# Patient Record
Sex: Female | Born: 1950 | Race: White | Hispanic: No | Marital: Married | Smoking: Former smoker
Health system: Southern US, Community
[De-identification: ages and names within clinical notes are randomized; demographics above are authoritative.]

## PROBLEM LIST (undated history)

## (undated) DIAGNOSIS — M1990 Unspecified osteoarthritis, unspecified site: Secondary | ICD-10-CM

## (undated) DIAGNOSIS — T7840XA Allergy, unspecified, initial encounter: Secondary | ICD-10-CM

## (undated) DIAGNOSIS — D649 Anemia, unspecified: Secondary | ICD-10-CM

## (undated) HISTORY — DX: Unspecified osteoarthritis, unspecified site: M19.90

## (undated) HISTORY — PX: REDUCTION MAMMAPLASTY: SUR839

## (undated) HISTORY — PX: TONSILLECTOMY: SUR1361

## (undated) HISTORY — DX: Allergy, unspecified, initial encounter: T78.40XA

---

## 2000-02-26 HISTORY — PX: BREAST SURGERY: SHX581

## 2001-04-18 ENCOUNTER — Emergency Department (HOSPITAL_COMMUNITY): Admission: EM | Admit: 2001-04-18 | Discharge: 2001-04-18 | Payer: Self-pay

## 2002-02-19 ENCOUNTER — Ambulatory Visit (HOSPITAL_COMMUNITY): Admission: RE | Admit: 2002-02-19 | Discharge: 2002-02-19 | Payer: Self-pay | Admitting: *Deleted

## 2003-12-23 ENCOUNTER — Other Ambulatory Visit: Admission: RE | Admit: 2003-12-23 | Discharge: 2003-12-23 | Payer: Self-pay | Admitting: Family Medicine

## 2011-06-29 HISTORY — PX: COSMETIC SURGERY: SHX468

## 2015-09-04 ENCOUNTER — Ambulatory Visit: Payer: Self-pay | Admitting: Family

## 2015-09-14 ENCOUNTER — Ambulatory Visit (INDEPENDENT_AMBULATORY_CARE_PROVIDER_SITE_OTHER): Payer: BLUE CROSS/BLUE SHIELD

## 2015-09-14 ENCOUNTER — Other Ambulatory Visit: Payer: BLUE CROSS/BLUE SHIELD

## 2015-09-14 ENCOUNTER — Other Ambulatory Visit: Payer: Self-pay | Admitting: Orthopedic Surgery

## 2015-09-14 DIAGNOSIS — R52 Pain, unspecified: Secondary | ICD-10-CM | POA: Diagnosis not present

## 2015-09-14 DIAGNOSIS — M25562 Pain in left knee: Secondary | ICD-10-CM | POA: Diagnosis not present

## 2015-09-14 DIAGNOSIS — M25561 Pain in right knee: Secondary | ICD-10-CM

## 2016-01-12 ENCOUNTER — Other Ambulatory Visit: Payer: Self-pay | Admitting: Family Medicine

## 2016-01-12 DIAGNOSIS — S0990XA Unspecified injury of head, initial encounter: Secondary | ICD-10-CM

## 2016-01-16 ENCOUNTER — Ambulatory Visit (INDEPENDENT_AMBULATORY_CARE_PROVIDER_SITE_OTHER): Payer: BLUE CROSS/BLUE SHIELD

## 2016-01-16 DIAGNOSIS — S0990XA Unspecified injury of head, initial encounter: Secondary | ICD-10-CM | POA: Diagnosis not present

## 2016-01-16 DIAGNOSIS — X58XXXA Exposure to other specified factors, initial encounter: Secondary | ICD-10-CM | POA: Diagnosis not present

## 2016-01-18 ENCOUNTER — Other Ambulatory Visit: Payer: Self-pay | Admitting: Orthopedic Surgery

## 2016-01-18 ENCOUNTER — Other Ambulatory Visit: Payer: BLUE CROSS/BLUE SHIELD

## 2016-01-18 ENCOUNTER — Ambulatory Visit (INDEPENDENT_AMBULATORY_CARE_PROVIDER_SITE_OTHER): Payer: BLUE CROSS/BLUE SHIELD

## 2016-01-18 DIAGNOSIS — M25531 Pain in right wrist: Secondary | ICD-10-CM | POA: Diagnosis not present

## 2016-01-18 DIAGNOSIS — T148XXA Other injury of unspecified body region, initial encounter: Secondary | ICD-10-CM

## 2016-02-15 ENCOUNTER — Telehealth: Payer: Self-pay | Admitting: Family Medicine

## 2016-02-15 NOTE — Telephone Encounter (Signed)
Patient given a new patient appointment for 5/1 with Christy. Patient aware that we did not have any new patient openings for tomorrow. She also requested only seeing a female.

## 2016-02-26 ENCOUNTER — Encounter (INDEPENDENT_AMBULATORY_CARE_PROVIDER_SITE_OTHER): Payer: Self-pay

## 2016-02-26 ENCOUNTER — Ambulatory Visit (INDEPENDENT_AMBULATORY_CARE_PROVIDER_SITE_OTHER): Payer: BLUE CROSS/BLUE SHIELD | Admitting: Family

## 2016-02-26 ENCOUNTER — Encounter: Payer: Self-pay | Admitting: Family

## 2016-02-26 VITALS — BP 108/69 | HR 71 | Temp 97.9°F | Ht 66.0 in | Wt 200.0 lb

## 2016-02-26 DIAGNOSIS — M25561 Pain in right knee: Secondary | ICD-10-CM | POA: Insufficient documentation

## 2016-02-26 DIAGNOSIS — J309 Allergic rhinitis, unspecified: Secondary | ICD-10-CM | POA: Diagnosis not present

## 2016-02-26 DIAGNOSIS — R0683 Snoring: Secondary | ICD-10-CM | POA: Diagnosis not present

## 2016-02-26 DIAGNOSIS — Z1159 Encounter for screening for other viral diseases: Secondary | ICD-10-CM

## 2016-02-26 DIAGNOSIS — Z1211 Encounter for screening for malignant neoplasm of colon: Secondary | ICD-10-CM | POA: Diagnosis not present

## 2016-02-26 NOTE — Patient Instructions (Signed)
Sleep Apnea  Sleep apnea is a sleep disorder characterized by abnormal pauses in breathing while you sleep. When your breathing pauses, the level of oxygen in your blood decreases. This causes you to move out of deep sleep and into light sleep. As a result, your quality of sleep is poor, and the system that carries your blood throughout your body (cardiovascular system) experiences stress. If sleep apnea remains untreated, the following conditions can develop:  High blood pressure (hypertension).  Coronary artery disease.  Inability to achieve or maintain an erection (impotence).  Impairment of your thought process (cognitive dysfunction). There are three types of sleep apnea: 1. Obstructive sleep apnea--Pauses in breathing during sleep because of a blocked airway. 2. Central sleep apnea--Pauses in breathing during sleep because the area of the brain that controls your breathing does not send the correct signals to the muscles that control breathing. 3. Mixed sleep apnea--A combination of both obstructive and central sleep apnea. RISK FACTORS The following risk factors can increase your risk of developing sleep apnea:  Being overweight.  Smoking.  Having narrow passages in your nose and throat.  Being of older age.  Being female.  Alcohol use.  Sedative and tranquilizer use.  Ethnicity. Among individuals younger than 35 years, African Americans are at increased risk of sleep apnea. SYMPTOMS   Difficulty staying asleep.  Daytime sleepiness and fatigue.  Loss of energy.  Irritability.  Loud, heavy snoring.  Morning headaches.  Trouble concentrating.  Forgetfulness.  Decreased interest in sex.  Unexplained sleepiness. DIAGNOSIS  In order to diagnose sleep apnea, your caregiver will perform a physical examination. A sleep study done in the comfort of your own home may be appropriate if you are otherwise healthy. Your caregiver may also recommend that you spend the  night in a sleep lab. In the sleep lab, several monitors record information about your heart, lungs, and brain while you sleep. Your leg and arm movements and blood oxygen level are also recorded. TREATMENT The following actions may help to resolve mild sleep apnea:  Sleeping on your side.   Using a decongestant if you have nasal congestion.   Avoiding the use of depressants, including alcohol, sedatives, and narcotics.   Losing weight and modifying your diet if you are overweight. There also are devices and treatments to help open your airway:  Oral appliances. These are custom-made mouthpieces that shift your lower jaw forward and slightly open your bite. This opens your airway.  Devices that create positive airway pressure. This positive pressure "splints" your airway open to help you breathe better during sleep. The following devices create positive airway pressure:  Continuous positive airway pressure (CPAP) device. The CPAP device creates a continuous level of air pressure with an air pump. The air is delivered to your airway through a mask while you sleep. This continuous pressure keeps your airway open.  Nasal expiratory positive airway pressure (EPAP) device. The EPAP device creates positive air pressure as you exhale. The device consists of single-use valves, which are inserted into each nostril and held in place by adhesive. The valves create very little resistance when you inhale but create much more resistance when you exhale. That increased resistance creates the positive airway pressure. This positive pressure while you exhale keeps your airway open, making it easier to breath when you inhale again.  Bilevel positive airway pressure (BPAP) device. The BPAP device is used mainly in patients with central sleep apnea. This device is similar to the CPAP device because   it also uses an air pump to deliver continuous air pressure through a mask. However, with the BPAP machine, the  pressure is set at two different levels. The pressure when you exhale is lower than the pressure when you inhale.  Surgery. Typically, surgery is only done if you cannot comply with less invasive treatments or if the less invasive treatments do not improve your condition. Surgery involves removing excess tissue in your airway to create a wider passage way.   This information is not intended to replace advice given to you by your health care provider. Make sure you discuss any questions you have with your health care provider.   Document Released: 10/04/2002 Document Revised: 11/04/2014 Document Reviewed: 02/20/2012 Elsevier Interactive Patient Education 2016 [employer]   

## 2016-02-26 NOTE — Progress Notes (Signed)
Subjective:    Patient ID: Jenna Smith, female    DOB: 1951/10/28, 65 y.o.   MRN: [id number]   HPI Pt presents to the office today to establish care. Pt states she has  woke up "gasping for air" a few times and that her husband had told her she "stops breathing at night". Pt has also been told he snores loudly at night. Pt states this has been going for years, but states it has gotten worse. Pt states she was suppose to have a sleep study done years ago, but cancelled. PT states she has gained 40 lbs over the last 4 years.   PT has allergic rhinitis that is stable with her Flonase and zyrtec. PT states she is followed by Ortho for her right knee pain and will need surgery in the next few months to repair a meniscus tear.     Review of Systems  Constitutional: Negative.   HENT: Negative.   Eyes: Negative.   Respiratory: Negative.  Negative for shortness of breath.   Cardiovascular: Negative.  Negative for palpitations.  Gastrointestinal: Negative.   Endocrine: Negative.   Genitourinary: Negative.   Musculoskeletal: Negative.   Neurological: Positive for facial asymmetry. Negative for headaches.  Hematological: Negative.   Psychiatric/Behavioral: Negative.   All other systems reviewed and are negative.  Social History   Social History  . Marital Status: Married    Spouse Name: N/A  . Number of Children: N/A  . Years of Education: N/A   Social History Main Topics  . Smoking status: Former Smoker    Quit date: 10/28/1984  . Smokeless tobacco: Never Used  . Alcohol Use: No  . Drug Use: No  . Sexual Activity: Not Asked   Other Topics Concern  . None   Social History Narrative  . None   Family History  Problem Relation Age of Onset  . Aneurysm Mother     passed away at age 84  . Cancer Father     passed away after Prostate cancer metastesize  . Cancer Sister     she passed away 22 due to Breast Cancer  . Stroke Sister   . Aneurysm Sister           Objective:   Physical Exam  Constitutional: She is oriented to person, place, and time. She appears well-developed and well-nourished. No distress.  HENT:  Head: Normocephalic and atraumatic.  Right Ear: External ear normal.  Left Ear: External ear normal.  Nose: Nose normal.  Mouth/Throat: Oropharynx is clear and moist.  Eyes: Pupils are equal, round, and reactive to light.  Neck: Normal range of motion. Neck supple. No thyromegaly present.  Cardiovascular: Normal rate, regular rhythm, normal heart sounds and intact distal pulses.   No murmur heard. Pulmonary/Chest: Effort normal and breath sounds normal. No respiratory distress. She has no wheezes.  Abdominal: Soft. Bowel sounds are normal. She exhibits no distension. There is no tenderness.  Musculoskeletal: Normal range of motion. She exhibits no edema or tenderness.  Neurological: She is alert and oriented to person, place, and time. She has normal reflexes. No cranial nerve deficit.  Skin: Skin is warm and dry.  Psychiatric: She has a normal mood and affect. Her behavior is normal. Judgment and thought content normal.  Vitals reviewed.     BP 108/69 mmHg  Pulse 71  Temp(Src) 97.9 F (36.6 C) (Oral)  Ht 5\' 6"  (1.676 m)  Wt 200 lb (90.719 kg)  BMI 32.30 kg/m2  Assessment & Plan:  1. Allergic rhinitis, unspecified allergic rhinitis type - CMP14+EGFR  2. Right knee pain - CMP14+EGFR  3. Snoring - Ambulatory referral to Sleep Studies - CMP14+EGFR  4. Colon cancer screening - Fecal occult blood, imunochemical; Future  5. Need for hepatitis C screening test - Hepatitis C Antibody   Continue all meds Labs pending Health Maintenance reviewed Diet and exercise encouraged RTO 6 months   Jannifer Rodney, FNP

## 2016-02-27 ENCOUNTER — Other Ambulatory Visit: Payer: BLUE CROSS/BLUE SHIELD

## 2016-02-27 DIAGNOSIS — Z1211 Encounter for screening for malignant neoplasm of colon: Secondary | ICD-10-CM

## 2016-02-27 LAB — HEPATITIS C ANTIBODY: Hep C Virus Ab: <0.1 s/co ratio (ref 0.0–0.9)

## 2016-02-27 LAB — CMP14+EGFR
ALT: 30 IU/L (ref 0–32)
AST: 20 IU/L (ref 0–40)
Albumin/Globulin Ratio: 1.5 (ref 1.2–2.2)
Albumin: 4 g/dL (ref 3.6–4.8)
Alkaline Phosphatase: 99 IU/L (ref 39–117)
BUN / CREAT RATIO: 23 (ref 12–28)
BUN: 19 mg/dL (ref 8–27)
Bilirubin Total: 0.3 mg/dL (ref 0.0–1.2)
CALCIUM: 9.4 mg/dL (ref 8.7–10.3)
CO2: 21 mmol/L (ref 18–29)
CREATININE: 0.82 mg/dL (ref 0.57–1.00)
Chloride: 100 mmol/L (ref 96–106)
GFR, EST AFRICAN AMERICAN: 89 mL/min/{1.73_m2} (ref >59)
GFR, EST NON AFRICAN AMERICAN: 77 mL/min/{1.73_m2} (ref >59)
GLOBULIN, TOTAL: 2.6 g/dL (ref 1.5–4.5)
GLUCOSE: 96 mg/dL (ref 65–99)
Potassium: 4.5 mmol/L (ref 3.5–5.2)
SODIUM: 138 mmol/L (ref 134–144)
Total Protein: 6.6 g/dL (ref 6.0–8.5)

## 2016-02-29 ENCOUNTER — Encounter: Payer: Self-pay | Admitting: Pulmonary Disease

## 2016-02-29 ENCOUNTER — Ambulatory Visit (INDEPENDENT_AMBULATORY_CARE_PROVIDER_SITE_OTHER): Payer: BLUE CROSS/BLUE SHIELD | Admitting: Pulmonary Disease

## 2016-02-29 VITALS — BP 108/76 | HR 78 | Ht 66.0 in | Wt 198.6 lb

## 2016-02-29 DIAGNOSIS — J301 Allergic rhinitis due to pollen: Secondary | ICD-10-CM

## 2016-02-29 DIAGNOSIS — R0683 Snoring: Secondary | ICD-10-CM | POA: Diagnosis not present

## 2016-02-29 LAB — FECAL OCCULT BLOOD, IMMUNOCHEMICAL: FECAL OCCULT BLD: NEGATIVE

## 2016-02-29 NOTE — Patient Instructions (Addendum)
Follow up in 6 months 

## 2016-02-29 NOTE — Progress Notes (Signed)
Subjective:    Patient ID: Jenna Smith, female    DOB: [birth year], 65 y.o.   MRN: [id number]  HPI    Review of Systems  Constitutional: Positive for unexpected weight change. Negative for fever.  HENT: Positive for congestion. Negative for dental problem, ear pain, nosebleeds, postnasal drip, rhinorrhea, sinus pressure, sneezing, sore throat and trouble swallowing.   Eyes: Negative for redness and itching.  Respiratory: Negative for cough, chest tightness, shortness of breath and wheezing.   Cardiovascular: Negative for palpitations and leg swelling.  Gastrointestinal: Negative for nausea and vomiting.  Genitourinary: Negative for dysuria.  Musculoskeletal: Negative for joint swelling.  Skin: Negative for rash.  Neurological: Negative for headaches.  Hematological: Does not bruise/bleed easily.  Psychiatric/Behavioral: Negative for dysphoric mood. The patient is not nervous/anxious.        Objective:   Physical Exam        Assessment & Plan:

## 2016-02-29 NOTE — Progress Notes (Signed)
Past Surgical History She  has past surgical history that includes Breast surgery (Bilateral, May 2001); Cosmetic surgery (September 2012); and Tonsillectomy.  Allergies  Allergen Reactions  . Latex Hives  . Neosporin [Neomycin-Bacitracin Zn-Polymyx] Hives  . Penicillins Hives    Family History Her family history includes Aneurysm in her mother and sister; Cancer in her father and sister; Stroke in her sister.  Social History She  reports that she quit smoking about 31 years ago. Her smoking use included Cigarettes. She has a 4.5 pack-year smoking history. She has never used smokeless tobacco. She reports that she does not drink alcohol or use illicit drugs.  Review of systems Constitutional: Positive for unexpected weight change. Negative for fever.  HENT: Positive for congestion. Negative for dental problem, ear pain, nosebleeds, postnasal drip, rhinorrhea, sinus pressure, sneezing, sore throat and trouble swallowing.   Eyes: Negative for redness and itching.  Respiratory: Negative for cough, chest tightness, shortness of breath and wheezing.   Cardiovascular: Negative for palpitations and leg swelling.  Gastrointestinal: Negative for nausea and vomiting.  Genitourinary: Negative for dysuria.  Musculoskeletal: Negative for joint swelling.  Skin: Negative for rash.  Neurological: Negative for headaches.  Hematological: Does not bruise/bleed easily.  Psychiatric/Behavioral: Negative for dysphoric mood. The patient is not nervous/anxious.     Current Outpatient Prescriptions on File Prior to Visit  Medication Sig  . fluticasone (FLONASE) 50 MCG/ACT nasal spray   . HYDROcodone-acetaminophen (NORCO/VICODIN) 5-325 MG tablet    No current facility-administered medications on file prior to visit.    Chief Complaint  Patient presents with  . SLEEP CONSULT    Referred by Dr Lenna Gilford. Epworth Score 12    Tests:  Past medical history She  has a past medical history of  Allergy.  Vital signs BP 108/76 mmHg  Pulse 78  Ht 5\' 6"  (1.676 m)  Wt 198 lb 9.6 oz (90.084 kg)  BMI 32.07 kg/m2  SpO2 97%  History of Present Illness Jenna Smith is a 65 y.o. female for evaluation of sleep problems.  Her husband has been worried about her sleep.  She doesn't feel like she has any issues except for uncomfortable sleep environment and allergies.  She is living in an RV at present, but will me moving into a home in June.  Her bed is very small and she has pet cat that sleeps in bed with her and her husband.  She typically has trouble with allergies during the Spring.  She is having more sinus congestion and scratchy throat.  She is taking nose sprays and allergy pills.    Her husband is worried that her snoring has gotten worse, and she stops breathing while asleep.  This seems to have progressed during the allergy season.  She goes to sleep at 10 pm.  She falls asleep instantly.  She wakes up several times during the night.  She thinks this is because she is worried about things.  She gets out of bed at 6 am.  She feels okay in the morning.  She denies morning headache.  She does not use anything to help her fall sleep or stay awake.  She will sometimes take a nap in the afternoon when she feels bored.  She denies sleep walking, sleep talking, bruxism, or nightmares.  There is no history of restless legs.  She denies sleep hallucinations, sleep paralysis, or cataplexy.  The Epworth score is 12 out of 24.  She gained about 40 lbs over the past  4 years, and her snoring has gotten worse with weight gain.  She thinks she will be able to lose weight once she moves into her new house, and gets into regular routine of activity.   Physical Exam:  General - No distress ENT - No sinus tenderness, no oral exudate, no LAN, no thyromegaly, TM clear, pupils equal/reactive, MP 4 Cardiac - s1s2 regular, no murmur, pulses symmetric Chest - No wheeze/rales/dullness, good air  entry, normal respiratory excursion Back - No focal tenderness Abd - Soft, non-tender, no organomegaly, + bowel sounds Ext - No edema Neuro - Normal strength, cranial nerves intact Skin - No rashes Psych - Normal mood, and behavior  Discussion: She has snoring and witnessed apnea.  She has sleep disruption, and daytime sleepiness.  I explained these are suggestive of obstructive sleep apnea.  She thinks much of her current issues are related to allergies and weight gain.  She subjectively doesn't feel like she has trouble with her sleep beyond having an uncomfortable sleep environment while living in an RV.  She would like to re-assess her sleep status after losing some weight and getting through allergy season.  Assessment/plan:  Snoring. - will re-assess in 6 months after her attempt with weight loss  Allergic rhinitis. - if her sleep symptoms persist during time of year with less allergy problems, then it would be more likely she would need to consider further sleep testing   Patient Instructions  Follow up in 6 months     Chesley Mires, M.D. Pager [phone] 02/29/2016, 9:35 AM

## 2016-06-21 ENCOUNTER — Encounter: Payer: Self-pay | Admitting: Family

## 2016-06-21 ENCOUNTER — Ambulatory Visit (INDEPENDENT_AMBULATORY_CARE_PROVIDER_SITE_OTHER): Payer: BLUE CROSS/BLUE SHIELD | Admitting: Family

## 2016-06-21 VITALS — BP 141/98 | HR 75 | Temp 97.4°F | Ht 66.0 in | Wt 188.8 lb

## 2016-06-21 DIAGNOSIS — S46112A Strain of muscle, fascia and tendon of long head of biceps, left arm, initial encounter: Secondary | ICD-10-CM | POA: Diagnosis not present

## 2016-06-21 DIAGNOSIS — S39012A Strain of muscle, fascia and tendon of lower back, initial encounter: Secondary | ICD-10-CM

## 2016-06-21 DIAGNOSIS — S46111A Strain of muscle, fascia and tendon of long head of biceps, right arm, initial encounter: Secondary | ICD-10-CM

## 2016-06-21 DIAGNOSIS — E669 Obesity, unspecified: Secondary | ICD-10-CM

## 2016-06-21 DIAGNOSIS — S46219A Strain of muscle, fascia and tendon of other parts of biceps, unspecified arm, initial encounter: Secondary | ICD-10-CM

## 2016-06-21 MED ORDER — CYCLOBENZAPRINE HCL 5 MG PO TABS: 5.0000 mg | ORAL_TABLET | Freq: Three times a day (TID) | ORAL | 1 refills | Status: DC | PRN

## 2016-06-21 MED ORDER — NAPROXEN 500 MG PO TABS: 500.0000 mg | ORAL_TABLET | Freq: Two times a day (BID) | ORAL | 1 refills | Status: DC

## 2016-06-21 NOTE — Patient Instructions (Signed)

## 2016-06-21 NOTE — Progress Notes (Signed)
Subjective:    Patient ID: Jenna Smith, female    DOB: [birth year], 65 y.o.   MRN: [id number]  HPI Pt presents to the office today with bilateral upper arm pain. Pt states she is moving and picking up boxes over the last 3 months and her pain started around that time. Pt states she is having a constant pain 9 out 10. Pt has taken motrin with mild relief.    Review of Systems  Constitutional: Negative.   HENT: Negative.   Eyes: Negative.   Respiratory: Negative.  Negative for shortness of breath.   Cardiovascular: Negative.  Negative for palpitations.  Gastrointestinal: Negative.   Endocrine: Negative.   Genitourinary: Negative.   Musculoskeletal: Positive for back pain and myalgias.  Neurological: Negative.  Negative for headaches.  Hematological: Negative.   Psychiatric/Behavioral: Negative.   All other systems reviewed and are negative.      Objective:   Physical Exam  Constitutional: She is oriented to person, place, and time. She appears well-developed and well-nourished. No distress.  HENT:  Head: Normocephalic.  Cardiovascular: Normal rate, regular rhythm, normal heart sounds and intact distal pulses.   No murmur heard. Pulmonary/Chest: Effort normal and breath sounds normal. No respiratory distress. She has no wheezes.  Abdominal: Soft. Bowel sounds are normal. She exhibits no distension. There is no tenderness.  Musculoskeletal: Normal range of motion. She exhibits no edema or tenderness.  Tenderness in right upper back, bilateral biceps  Neurological: She is alert and oriented to person, place, and time.  Skin: Skin is warm and dry.  Psychiatric: She has a normal mood and affect. Her behavior is normal. Judgment and thought content normal.  Vitals reviewed.   BP (!) 141/98   Pulse 75   Temp 97.4 F (36.3 C) (Oral)   Ht 5\' 6"  (1.676 m)   Wt 188 lb 12.8 oz (85.6 kg)   BMI 30.47 kg/m        Assessment & Plan:  1. Biceps strain, unspecified  laterality, initial encounter -Rest -Ice and heat -ROM exercises discussed -Take naprosyn BID for next 5-7 days, no other NSAID"s -RTO prn  - cyclobenzaprine (FLEXERIL) 5 MG tablet; Take 1 tablet (5 mg total) by mouth 3 (three) times daily as needed for muscle spasms.  Dispense: 60 tablet; Refill: 1 - naproxen (NAPROSYN) 500 MG tablet; Take 1 tablet (500 mg total) by mouth 2 (two) times daily with a meal.  Dispense: 60 tablet; Refill: 1  2. Back strain, initial encounter -Rest -Ice and heat -ROM exercises discussed -Take naprosyn BID for next 5-7 days, no other NSAID"s -RTO prn  - cyclobenzaprine (FLEXERIL) 5 MG tablet; Take 1 tablet (5 mg total) by mouth 3 (three) times daily as needed for muscle spasms.  Dispense: 60 tablet; Refill: 1 - naproxen (NAPROSYN) 500 MG tablet; Take 1 tablet (500 mg total) by mouth 2 (two) times daily with a meal.  Dispense: 60 tablet; Refill: 1  3. Obesity (BMI 30-39.9)  Jannifer Rodney, FNP

## 2016-10-23 ENCOUNTER — Encounter: Payer: BLUE CROSS/BLUE SHIELD | Admitting: *Deleted

## 2016-11-28 ENCOUNTER — Ambulatory Visit (INDEPENDENT_AMBULATORY_CARE_PROVIDER_SITE_OTHER): Payer: Self-pay | Admitting: Family

## 2016-11-28 ENCOUNTER — Encounter: Payer: Self-pay | Admitting: Family

## 2016-11-28 VITALS — BP 137/91 | HR 116 | Temp 99.2°F | Ht 66.0 in | Wt 179.0 lb

## 2016-11-28 DIAGNOSIS — R6889 Other general symptoms and signs: Secondary | ICD-10-CM

## 2016-11-28 DIAGNOSIS — M25561 Pain in right knee: Secondary | ICD-10-CM

## 2016-11-28 DIAGNOSIS — Z20828 Contact with and (suspected) exposure to other viral communicable diseases: Secondary | ICD-10-CM

## 2016-11-28 MED ORDER — HYDROCODONE-HOMATROPINE 5-1.5 MG/5ML PO SYRP: 5.0000 mL | ORAL_SOLUTION | Freq: Three times a day (TID) | ORAL | 0 refills | Status: DC | PRN

## 2016-11-28 MED ORDER — NAPROXEN 500 MG PO TABS: 500.0000 mg | ORAL_TABLET | Freq: Two times a day (BID) | ORAL | 1 refills | Status: DC

## 2016-11-28 MED ORDER — OSELTAMIVIR PHOSPHATE 75 MG PO CAPS: 75.0000 mg | ORAL_CAPSULE | Freq: Two times a day (BID) | ORAL | 0 refills | Status: DC

## 2016-11-28 NOTE — Progress Notes (Signed)
Subjective:    Patient ID: Jenna Smith, female    DOB: [birth year], 66 y.o.   MRN: [id number]  Pt presents to the office today with right knee pain that started two weeks ago and states she was watching her grandchild a few days ago who was recently diagnosed with the flu. States she started coughing and has a sore throat and has a low grade fever.  Knee Pain   The incident occurred more than 1 week ago. The incident occurred at home. The injury mechanism was a twisting injury (walking down blechers). The pain is present in the right knee. The quality of the pain is described as aching. The pain is at a severity of 8/10. The pain is moderate. The pain has been intermittent since onset. Pertinent negatives include no numbness or tingling. She reports no foreign bodies present. The symptoms are aggravated by weight bearing. She has tried nothing for the symptoms. The treatment provided no relief.      Review of Systems  Constitutional: Positive for fever.  HENT: Positive for congestion.   Respiratory: Positive for cough.   Musculoskeletal: Positive for joint swelling.  Neurological: Negative for tingling and numbness.  All other systems reviewed and are negative.      Objective:   Physical Exam  Constitutional: She is oriented to person, place, and time. She appears well-developed and well-nourished. No distress.  HENT:  Head: Normocephalic and atraumatic.  Right Ear: External ear normal.  Left Ear: External ear normal.  Nose: Mucosal edema and rhinorrhea present.  Mouth/Throat: Posterior oropharyngeal erythema present.  Eyes: Pupils are equal, round, and reactive to light.  Neck: Normal range of motion. Neck supple. No thyromegaly present.  Cardiovascular: Normal rate, regular rhythm, normal heart sounds and intact distal pulses.   No murmur heard. Pulmonary/Chest: Effort normal and breath sounds normal. No respiratory distress. She has no wheezes.  Abdominal: Soft. Bowel  sounds are normal. She exhibits no distension. There is no tenderness.  Musculoskeletal: Normal range of motion. She exhibits edema (trace in right knee) and tenderness (right knee mild tenderness with extension ).  Neurological: She is alert and oriented to person, place, and time. She has normal reflexes. No cranial nerve deficit.  Skin: Skin is warm and dry.  Psychiatric: She has a normal mood and affect. Her behavior is normal. Judgment and thought content normal.  Vitals reviewed.    BP (!) 137/91   Pulse (!) 116   Temp 99.2 F (37.3 C) (Oral)   Ht 5\' 6"  (1.676 m)   Wt 179 lb (81.2 kg)   BMI 28.89 kg/m      Assessment & Plan:  1. Flu-like symptoms -Force fluids -Rest Good hand hygiene discussed  - oseltamivir (TAMIFLU) 75 MG capsule; Take 1 capsule (75 mg total) by mouth 2 (two) times daily.  Dispense: 10 capsule; Refill: 0 - HYDROcodone-homatropine (HYCODAN) 5-1.5 MG/5ML syrup; Take 5 mLs by mouth every 8 (eight) hours as needed for cough.  Dispense: 120 mL; Refill: 0  2. Exposure to influenza - oseltamivir (TAMIFLU) 75 MG capsule; Take 1 capsule (75 mg total) by mouth 2 (two) times daily.  Dispense: 10 capsule; Refill: 0  3. Acute pain of right knee -Rest _ROM exercises  -Keep it elevated  - naproxen (NAPROSYN) 500 MG tablet; Take 1 tablet (500 mg total) by mouth 2 (two) times daily with a meal.  Dispense: 60 tablet; Refill: 1   Jannifer Rodney, FNP

## 2016-11-28 NOTE — Patient Instructions (Signed)

## 2016-12-02 ENCOUNTER — Ambulatory Visit: Payer: BLUE CROSS/BLUE SHIELD | Admitting: Family

## 2016-12-12 ENCOUNTER — Encounter: Payer: Self-pay | Admitting: Family

## 2016-12-19 ENCOUNTER — Ambulatory Visit (INDEPENDENT_AMBULATORY_CARE_PROVIDER_SITE_OTHER): Payer: Self-pay | Admitting: Family

## 2016-12-19 ENCOUNTER — Encounter: Payer: Self-pay | Admitting: Family

## 2016-12-19 VITALS — BP 110/72 | HR 90 | Temp 98.2°F | Ht 66.0 in | Wt 182.4 lb

## 2016-12-19 DIAGNOSIS — M25561 Pain in right knee: Secondary | ICD-10-CM

## 2016-12-19 MED ORDER — PREDNISONE 10 MG (21) PO TBPK: ORAL_TABLET | ORAL | 0 refills | Status: DC

## 2016-12-19 MED ORDER — NAPROXEN 500 MG PO TABS: 500.0000 mg | ORAL_TABLET | Freq: Two times a day (BID) | ORAL | 1 refills | Status: DC

## 2016-12-19 NOTE — Patient Instructions (Signed)

## 2016-12-19 NOTE — Progress Notes (Signed)
Subjective:    Patient ID: Jenna Smith, female    DOB: 09/14/1951, 66 y.o.   MRN: [id number]  Knee Pain   The incident occurred more than 1 week ago. There was no injury mechanism. The pain is present in the right knee. The quality of the pain is described as aching. The pain is at a severity of 4/10. The pain is moderate. The pain has been intermittent since onset. Pertinent negatives include no inability to bear weight, loss of motion, numbness or tingling. She reports no foreign bodies present. The symptoms are aggravated by movement. She has tried NSAIDs and rest for the symptoms. The treatment provided mild relief.      Review of Systems  Neurological: Negative for tingling and numbness.  All other systems reviewed and are negative.      Objective:   Physical Exam  Constitutional: She is oriented to person, place, and time. She appears well-developed and well-nourished. No distress.  HENT:  Head: Normocephalic.  Eyes: Pupils are equal, round, and reactive to light.  Neck: Normal range of motion. Neck supple. No thyromegaly present.  Cardiovascular: Normal rate, regular rhythm, normal heart sounds and intact distal pulses.   No murmur heard. Pulmonary/Chest: Effort normal and breath sounds normal. No respiratory distress. She has no wheezes.  Abdominal: Soft. Bowel sounds are normal. She exhibits no distension. There is no tenderness.  Musculoskeletal: Normal range of motion. She exhibits tenderness (mild tenderness with walking). She exhibits no edema.  Neurological: She is alert and oriented to person, place, and time.  Skin: Skin is warm and dry.  Psychiatric: She has a normal mood and affect. Her behavior is normal. Judgment and thought content normal.  Vitals reviewed.    BP 110/72   Pulse 90   Temp 98.2 F (36.8 C) (Oral)   Ht 5\' 6"  (1.676 m)   Wt 182 lb 6.4 oz (82.7 kg)   BMI 29.44 kg/m      Assessment & Plan:  1. Right knee pain, unspecified  chronicity Rest Ice No other NSAID's RTO prn  - predniSONE (STERAPRED UNI-PAK 21 TAB) 10 MG (21) TBPK tablet; Use as directed  Dispense: 21 tablet; Refill: 0 - naproxen (NAPROSYN) 500 MG tablet; Take 1 tablet (500 mg total) by mouth 2 (two) times daily with a meal.  Dispense: 60 tablet; Refill: 1  2. Acute pain of right knee Rest Ice No other NSAID's ROM exercises encouraged RTO prn  - predniSONE (STERAPRED UNI-PAK 21 TAB) 10 MG (21) TBPK tablet; Use as directed  Dispense: 21 tablet; Refill: 0 - naproxen (NAPROSYN) 500 MG tablet; Take 1 tablet (500 mg total) by mouth 2 (two) times daily with a meal.  Dispense: 60 tablet; Refill: 1  Jannifer Rodney, FNP

## 2017-06-11 ENCOUNTER — Ambulatory Visit (INDEPENDENT_AMBULATORY_CARE_PROVIDER_SITE_OTHER): Payer: Self-pay | Admitting: Family Medicine

## 2017-06-11 VITALS — BP 121/84 | HR 77 | Temp 97.3°F | Ht 66.0 in | Wt 185.0 lb

## 2017-06-11 DIAGNOSIS — M76891 Other specified enthesopathies of right lower limb, excluding foot: Secondary | ICD-10-CM

## 2017-06-11 MED ORDER — IBUPROFEN 200 MG PO TABS: 800.0000 mg | ORAL_TABLET | Freq: Three times a day (TID) | ORAL | 0 refills | Status: DC | PRN

## 2017-06-11 NOTE — Progress Notes (Addendum)
Subjective: JK:KXFG pain PCP: Sharion Balloon, FNP HWE:[id number] A Coplen is a 66 y.o. female presenting to clinic today for:  1. Knee pain Patient seen 12/19/2016 for right knee pain that occurred without preceding injury. She was treated with a Predpac and Naprosyn. In 2014 she had bilateral standing x-rays done of her knees  During that time they showed narrowing of the medial compartments bilaterally, with small associated osteophyte formation again on the medial aspects bilaterally.. She reports she had arthroscopy on 07/2016 w/ Dr Gladstone Lighter.  Patient notes that pain worsened last evening.  She denies preceding injury.  She reports swelling and pain on the side of her right knee.  She notes that this has been going on for months.  Pain is waxing and waning.  Pain is located on the posteriolateral aspect of the right knee.  She notes that she has discussed this with her orthopedist several times and she was told she needs a total knee replacement.  She could not get this done because of lack of insurance.  She reports she is getting flexiogenics injections which has helped with her anterior knee pain, which used to be her biggest concern in the past.  Her last injection was Monday.  She notes she was told her gait is why her knee swells.  She describes the pain as a burning pain.  She notes that the pain is worse with flexion and better with straightening her leg.  She takes Motrin 800mg  BID and took a Percocet last night with moderate relief. Though she notes that she did not take any medication this morning because she has not eaten.  She is using a knee brace daily.  Associated symptoms include: none.  No Locking or popping or her knee, lower extremities giving way, weakness, numbness/ tingling, redness, fevers.  Social Hx: Patient a retired Insurance underwriter.  PMH: negative diabetes.    Allergies  Allergen Reactions  . Latex Hives  . Neosporin [Neomycin-Bacitracin Zn-Polymyx] Hives  .  Penicillins Hives   Past Medical History:  Diagnosis Date  . Allergy    Family History  Problem Relation Age of Onset  . Aneurysm Mother        passed away at age 23  . Cancer Father        passed away after Prostate cancer metastesize  . Cancer Sister        she passed away 51 due to Breast Cancer  . Stroke Sister   . Aneurysm Sister    ROS: Per HPI  Objective: Office vital signs reviewed. BP 121/84   Pulse 77   Temp (!) 97.3 F (36.3 C) (Oral)   Ht 5\' 6"  (1.676 m)   Wt 185 lb (83.9 kg)   BMI 29.86 kg/m   Physical Examination:  General: Awake, alert, well nourished, No acute distress Pulm: normal work of breathing on room air Extremities: warm, well perfused, No cyanosis or clubbing; +2 pulses bilaterally  Right knee: Moderate soft tissue swelling of the entire knee. No obvious joint effusion appreciated. Well-healed arthroscopy scars noted. There is no overlying erythema or ecchymosis. She has lost about 50% active range of motion and flexion and about 10% active range of motion and extension. Anterior aspect of the knee with normal temperature. She has no tenderness to palpation of the joint line, patella, quads tendon, or patellar tendon. She does have point tenderness near the insertion of the LCL. There is mild increased warmth directly over this site as  well. There is no associated erythema or ecchymosis. No posterior or masses palpated. No tenderness to the posterior popliteal fossa. Negative Thessaly. No ligamentous laxity. MSK: antalgic gait; she has a visible limp Skin: dry; intact; no rashes or lesions Neuro: 5/5 LE Strength and light touch sensation grossly intact  Assessment/ Plan: 66 y.o. female   Tendonitis of knee, right Patient status post knee arthroscopy in October 2017. Her 2014 bilateral standing x-rays were reviewed. Patient's exam notable for moderate swelling of the right knee. She has associated point tenderness at the insertion point of the LCL on  the right. This is likely a tendinitis of the LCL versus popliteal fibular ligament, that is a result of her abnormal gait secondary to chronic knee pain from known osteoarthritis. However, this could also be a bursitis or atypical Baker cyst. No evidence of joint infection. Knee was stable. Meniscal exam also normal. I discussed with her that she could consider following back up with Dr. Earl Lites versus seeing sports medicine for further evaluation with ultrasound, consideration for orthotics, and instructions for gait training. Physical therapy was considered however patient is uninsured and she likely can get the exercise that she needs to rehabilitation from sports medicine during that visit.  I recommended that she continue with the oral NSAID. She had Motrin 200 mg over-the-counter at home which she has been using 800 mg twice a day for pain. I informed her that she could take this up to every 8 hours if needed with a meal for pain and inflammation.  I recommended that she continue with her knee brace. Continue elevating leg. Apply ice to affected area to decrease inflammation. I discussed return precautions including signs and symptoms of joint infection. She is to follow up with her primary care provider as needed or as scheduled for routine care.   Orders Placed This Encounter  Procedures  . Ambulatory referral to Sports Medicine    Referral Priority:   Routine    Referral Type:   Consultation    Number of Visits Requested:   1   Meds ordered this encounter  Medications  . ibuprofen (MOTRIN IB) 200 MG tablet    Sig: Take 4 tablets (800 mg total) by mouth every 8 (eight) hours as needed.    Dispense:  30 tablet    Refill:  [place], DO [place] [phone]

## 2017-06-11 NOTE — Assessment & Plan Note (Addendum)
Patient status post knee arthroscopy in October 2017. Her 2014 bilateral standing x-rays were reviewed. Patient's exam notable for moderate swelling of the right knee. She has associated point tenderness at the insertion point of the LCL on the right. This is likely a tendinitis of the LCL versus popliteal fibular ligament, that is a result of her abnormal gait secondary to chronic knee pain from known osteoarthritis. However, this could also be a bursitis or atypical Baker cyst. No evidence of joint infection. Knee was stable. Meniscal exam also normal. I discussed with her that she could consider following back up with Dr. Earl Lites versus seeing sports medicine for further evaluation with ultrasound, consideration for orthotics, and instructions for gait training. Physical therapy was considered however patient is uninsured and she likely can get the exercise that she needs to rehabilitation from sports medicine during that visit.  I recommended that she continue with the oral NSAID. She had Motrin 200 mg over-the-counter at home which she has been using 800 mg twice a day for pain. I informed her that she could take this up to every 8 hours if needed with a meal for pain and inflammation.  I recommended that she continue with her knee brace. Continue elevating leg. Apply ice to affected area to decrease inflammation. I discussed return precautions including signs and symptoms of joint infection. She is to follow up with her primary care provider as needed or as scheduled for routine care.

## 2017-06-11 NOTE — Patient Instructions (Addendum)
Continue bracing your knee.  Continue Motrin 800mg .  You can take this up to 3 times daily if needed.  Ice the affected area.  You have bene referred to Dr Nona Dell office at [facility].  Tendinitis Tendinitis is inflammation of a tendon. A tendon is a strong cord of tissue that connects muscle to bone. Tendinitis can affect any tendon, but it most commonly affects the shoulder tendon (rotator cuff), ankle tendon (Achilles tendon), elbow tendon (triceps tendon), or one of the tendons in the wrist. What are the causes? This condition may be caused by:  Overusing a tendon or muscle. This is common.  Age-related wear and tear.  Injury.  Inflammatory conditions, such as arthritis.  Certain medicines.  What increases the risk? This condition is more likely to develop in people who do activities that involve repetitive motions. What are the signs or symptoms? Symptoms of this condition may include:  Pain.  Tenderness.  Mild swelling.  How is this diagnosed? This condition is diagnosed with a physical exam. You may also have tests, such as:  Ultrasound. This uses sound waves to make an image of your affected area.  MRI.  How is this treated? This condition may be treated by resting, icing, applying pressure (compression), and raising (elevating) the area above the level of your heart. This is known as RICE therapy. Treatment may also include:  Medicines to help reduce inflammation or to help reduce pain.  Exercises or physical therapy to strengthen and stretch the tendon.  A brace or splint.  Surgery (rare).  Follow these instructions at home:  If you have a splint or brace:  Wear the splint or brace as told by your health care provider. Remove it only as told by your health care provider.  Loosen the splint or brace if your fingers or toes tingle, become numb, or turn cold and blue.  Do not take baths, swim, or use a hot tub until your health care provider  approves. Ask your health care provider if you can take showers. You may only be allowed to take sponge baths for bathing.  Do not let your splint or brace get wet if it is not waterproof. ? If your splint or brace is not waterproof, cover it with a watertight plastic bag when you take a bath or a shower.  Keep the splint or brace clean. Managing pain, stiffness, and swelling  If directed, apply ice to the affected area. ? Put ice in a plastic bag. ? Place a towel between your skin and the bag. ? Leave the ice on for 20 minutes, 2-3 times a day.  If directed, apply heat to the affected area as often as told by your health care provider. Use the heat source that your health care provider recommends, such as a moist heat pack or a heating pad. ? Place a towel between your skin and the heat source. ? Leave the heat on for 20-30 minutes. ? Remove the heat if your skin turns bright red. This is especially important if you are unable to feel pain, heat, or cold. You may have a greater risk of getting burned.  Move the fingers or toes of the affected limb often, if this applies. This can help to prevent stiffness and lessen swelling.  If directed, elevate the affected area above the level of your heart while you are sitting or lying down. Driving  Do not drive or operate heavy machinery while taking prescription pain medicine.  Ask  your health care provider when it is safe to drive if you have a splint or brace on any part of your arm or leg. Activity  Return to your normal activities as told by your health care provider. Ask your health care provider what activities are safe for you.  Rest the affected area as told by your health care provider.  Avoid using the affected area while you are experiencing symptoms of tendinitis.  Do exercises as told by your health care provider. General instructions  If you have a splint, do not put pressure on any part of the splint until it is fully  hardened. This may take several hours.  Wear an elastic bandage or compression wrap only as told by your health care provider.  Take over-the-counter and prescription medicines only as told by your health care provider.  Keep all follow-up visits as told by your health care provider. This is important. Contact a health care provider if:  Your symptoms do not improve.  You develop new, unexplained problems, such as numbness in your hands. This information is not intended to replace advice given to you by your health care provider. Make sure you discuss any questions you have with your health care provider. Document Released: 10/11/2000 Document Revised: 06/13/2016 Document Reviewed: 07/17/2015 Elsevier Interactive Patient Education  [employer].

## 2017-07-01 ENCOUNTER — Ambulatory Visit (INDEPENDENT_AMBULATORY_CARE_PROVIDER_SITE_OTHER): Payer: Self-pay | Admitting: Family Medicine

## 2017-07-01 VITALS — BP 118/70 | Ht 66.0 in | Wt 180.0 lb

## 2017-07-01 DIAGNOSIS — M24561 Contracture, right knee: Secondary | ICD-10-CM

## 2017-07-01 DIAGNOSIS — M76891 Other specified enthesopathies of right lower limb, excluding foot: Secondary | ICD-10-CM

## 2017-07-01 NOTE — Patient Instructions (Addendum)
The Financial Counselor at:  [facility] [phone]  will be calling you to set up your payment plan before scheduling the actual appt. The financial counselor's number is [phone].

## 2017-07-02 ENCOUNTER — Encounter: Payer: Self-pay | Admitting: Family Medicine

## 2017-07-02 NOTE — Progress Notes (Signed)
    CHIEF COMPLAINT / HPI:  Left knee stiffness and pain. Has seen several physicians re this issue and just finished a series of viscosuplementation shots through [facility]. Has had several CSI. Had arthroscopic surgery several months ago (Dr. Leigh Aurora). Has been told she has a tight IT band and that she needs TKR but has no current insurance so she is trying to last until she gets insurance coverage in about a year. Unfortunately pain is constant, 7/10. Unable to fully straightenher knee and has difficulty walking even a few steps. Has to have help with many of her IADLs and some ADLs (dressing/ shoes).  Would like opinion. Does not bring any notes or imaging results or CD with her. Is very frustrated. Accompanied by her husband.  PERTINENT  PMH / PSH: I have reviewed the patient's medications, allergies, past medical and surgical history, smoking status.  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 Lawernce Pitts) 07/2016. V  former smoker  REVIEW OF SYSTEMS:  See HPI. No unusual weight change.  OBJECTIVE:  Vital signs are reviewed.  WD WN NAD RIGHT knee lacks full extension by 20-25 degrees. Very tender to palpation so exam is limited IMAGING: she brings no imaging or notes with her. Had MRI but it is not available in our EMR>  ASSESSMENT / PLAN: Severe DJD of right knee is likely given history and limited exam. 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 Her issues seem surgiccal. She has had CSI and hyaluronic acid injections. I have nothing to  Offer her. I suggested she return to her surgeon or seek second opinion. I will be happy to refer her to [facility] for second iopinion. She will have to go through financial counselor as she has no insurance currently. I advised her to get the [place] of her MRI--nopt the report but he actual images---to take with her  to any future appts. Greater than 50% of our 45 minute office visit was spent in counseling and education regarding these issues.

## 2017-07-03 ENCOUNTER — Telehealth: Payer: Self-pay | Admitting: Family Medicine

## 2017-07-03 NOTE — Telephone Encounter (Signed)
Patient went to see ortho and they told her she needed a knee replacement as ASAP, but patient will not have insurance till next year until June for Medicare. She is wondering if she can get a refill on Naproxen 500mg . Patient uses [facility] in [place]. Please advise.

## 2017-07-04 ENCOUNTER — Other Ambulatory Visit: Payer: Self-pay | Admitting: Family Medicine

## 2017-07-04 MED ORDER — NAPROXEN 500 MG PO TABS: 500.0000 mg | ORAL_TABLET | Freq: Two times a day (BID) | ORAL | 0 refills | Status: DC

## 2017-07-04 NOTE — Telephone Encounter (Signed)
Patient aware.

## 2017-07-04 NOTE — Telephone Encounter (Signed)
Refill has been sent in to pharmacy for a 1 month supply.  Please inform patient.

## 2017-07-17 ENCOUNTER — Other Ambulatory Visit: Payer: Self-pay | Admitting: Orthopedic Surgery

## 2017-07-17 ENCOUNTER — Ambulatory Visit (INDEPENDENT_AMBULATORY_CARE_PROVIDER_SITE_OTHER): Payer: Self-pay

## 2017-07-17 ENCOUNTER — Other Ambulatory Visit (INDEPENDENT_AMBULATORY_CARE_PROVIDER_SITE_OTHER): Payer: Self-pay

## 2017-07-17 DIAGNOSIS — M25561 Pain in right knee: Secondary | ICD-10-CM

## 2017-07-17 DIAGNOSIS — R52 Pain, unspecified: Secondary | ICD-10-CM

## 2017-11-11 NOTE — Patient Instructions (Addendum)
Jenna Smith  11/11/2017   Your procedure is scheduled on: 11-19-17  Report to [facility] Main  Entrance to Admitting  at 7:30 AM    Call this number if you have problems the morning of surgery [phone]   Remember: Do not eat food or drink liquids :After Midnight.     Take these medicines the morning of surgery with A SIP OF WATER: None                                You may not have any metal on your body including hair pins and              piercings  Do not wear jewelry, make-up, lotions, powders or perfumes, deodorant             Do not wear nail polish.  Do not shave  48 hours prior to surgery.                 Do not bring valuables to the hospital. [place].  Contacts, dentures or bridgework may not be worn into surgery.  Leave suitcase in the car. After surgery it may be brought to your room.                Please read over the following fact sheets you were given: _____________________________________________________________________           [facility] - Preparing for Surgery Before surgery, you can play an important role.  Because skin is not sterile, your skin needs to be as free of germs as possible.  You can reduce the number of germs on your skin by washing with CHG (chlorahexidine gluconate) soap before surgery.  CHG is an antiseptic cleaner which kills germs and bonds with the skin to continue killing germs even after washing. Please DO NOT use if you have an allergy to CHG or antibacterial soaps.  If your skin becomes reddened/irritated stop using the CHG and inform your nurse when you arrive at Short Stay. Do not shave (including legs and underarms) for at least 48 hours prior to the first CHG shower.  You may shave your face/neck. Please follow these instructions carefully:  1.  Shower with CHG Soap the night before surgery and the  morning of Surgery.  2.  If you choose to  wash your hair, wash your hair first as usual with your  normal  shampoo.  3.  After you shampoo, rinse your hair and body thoroughly to remove the  shampoo.                           4.  Use CHG as you would any other liquid soap.  You can apply chg directly  to the skin and wash                       Gently with a scrungie or clean washcloth.  5.  Apply the CHG Soap to your body ONLY FROM THE NECK DOWN.   Do not use on face/ open  Wound or open sores. Avoid contact with eyes, ears mouth and genitals (private parts).                       Wash face,  Genitals (private parts) with your normal soap.             6.  Wash thoroughly, paying special attention to the area where your surgery  will be performed.  7.  Thoroughly rinse your body with warm water from the neck down.  8.  DO NOT shower/wash with your normal soap after using and rinsing off  the CHG Soap.                9.  Pat yourself dry with a clean towel.            10.  Wear clean pajamas.            11.  Place clean sheets on your bed the night of your first shower and do not  sleep with pets. Day of Surgery : Do not apply any lotions/deodorants the morning of surgery.  Please wear clean clothes to the hospital/surgery center.  FAILURE TO FOLLOW THESE INSTRUCTIONS MAY RESULT IN THE CANCELLATION OF YOUR SURGERY PATIENT SIGNATURE_________________________________  NURSE SIGNATURE__________________________________  ________________________________________________________________________   Adam Phenix  An incentive spirometer is a tool that can help keep your lungs clear and active. This tool measures how well you are filling your lungs with each breath. Taking long deep breaths may help reverse or decrease the chance of developing breathing (pulmonary) problems (especially infection) following:  A long period of time when you are unable to move or be active. BEFORE THE PROCEDURE   If the  spirometer includes an indicator to show your best effort, your nurse or respiratory therapist will set it to a desired goal.  If possible, sit up straight or lean slightly forward. Try not to slouch.  Hold the incentive spirometer in an upright position. INSTRUCTIONS FOR USE  1. Sit on the edge of your bed if possible, or sit up as far as you can in bed or on a chair. 2. Hold the incentive spirometer in an upright position. 3. Breathe out normally. 4. Place the mouthpiece in your mouth and seal your lips tightly around it. 5. Breathe in slowly and as deeply as possible, raising the piston or the ball toward the top of the column. 6. Hold your breath for 3-5 seconds or for as long as possible. Allow the piston or ball to fall to the bottom of the column. 7. Remove the mouthpiece from your mouth and breathe out normally. 8. Rest for a few seconds and repeat Steps 1 through 7 at least 10 times every 1-2 hours when you are awake. Take your time and take a few normal breaths between deep breaths. 9. The spirometer may include an indicator to show your best effort. Use the indicator as a goal to work toward during each repetition. 10. After each set of 10 deep breaths, practice coughing to be sure your lungs are clear. If you have an incision (the cut made at the time of surgery), support your incision when coughing by placing a pillow or rolled up towels firmly against it. Once you are able to get out of bed, walk around indoors and cough well. You may stop using the incentive spirometer when instructed by your caregiver.  RISKS AND COMPLICATIONS  Take your time so you do not get  dizzy or light-headed.  If you are in pain, you may need to take or ask for pain medication before doing incentive spirometry. It is harder to take a deep breath if you are having pain. AFTER USE  Rest and breathe slowly and easily.  It can be helpful to keep track of a log of your progress. Your caregiver can provide  you with a simple table to help with this. If you are using the spirometer at home, follow these instructions: [place] IF:   You are having difficultly using the spirometer.  You have trouble using the spirometer as often as instructed.  Your pain medication is not giving enough relief while using the spirometer.  You develop fever of 100.5 F (38.1 C) or higher. SEEK IMMEDIATE MEDICAL CARE IF:   You cough up bloody sputum that had not been present before.  You develop fever of 102 F (38.9 C) or greater.  You develop worsening pain at or near the incision site. MAKE SURE YOU:   Understand these instructions.  Will watch your condition.  Will get help right away if you are not doing well or get worse. Document Released: 02/24/2007 Document Revised: 01/06/2012 Document Reviewed: 04/27/2007 Exit[employer] Patient Information 2014 [employer], [place].   ________________________________________________________________________  WHAT IS A BLOOD TRANSFUSION? Blood Transfusion Information  A transfusion is the replacement of blood or some of its parts. Blood is made up of multiple cells which provide different functions.  Red blood cells carry oxygen and are used for blood loss replacement.  White blood cells fight against infection.  Platelets control bleeding.  Plasma helps clot blood.  Other blood products are available for specialized needs, such as hemophilia or other clotting disorders. BEFORE THE TRANSFUSION  Who gives blood for transfusions?   Healthy volunteers who are fully evaluated to make sure their blood is safe. This is blood bank blood. Transfusion therapy is the safest it has ever been in the practice of medicine. Before blood is taken from a donor, a complete history is taken to make sure that person has no history of diseases nor engages in risky social behavior (examples are intravenous drug use or sexual activity with multiple partners). The donor's  travel history is screened to minimize risk of transmitting infections, such as malaria. The donated blood is tested for signs of infectious diseases, such as HIV and hepatitis. The blood is then tested to be sure it is compatible with you in order to minimize the chance of a transfusion reaction. If you or a relative donates blood, this is often done in anticipation of surgery and is not appropriate for emergency situations. It takes many days to process the donated blood. RISKS AND COMPLICATIONS Although transfusion therapy is very safe and saves many lives, the main dangers of transfusion include:   Getting an infectious disease.  Developing a transfusion reaction. This is an allergic reaction to something in the blood you were given. Every precaution is taken to prevent this. The decision to have a blood transfusion has been considered carefully by your caregiver before blood is given. Blood is not given unless the benefits outweigh the risks. AFTER THE TRANSFUSION  Right after receiving a blood transfusion, you will usually feel much better and more energetic. This is especially true if your red blood cells have gotten low (anemic). The transfusion raises the level of the red blood cells which carry oxygen, and this usually causes an energy increase.  The nurse administering the transfusion will  monitor you carefully for complications. HOME CARE INSTRUCTIONS  No special instructions are needed after a transfusion. You may find your energy is better. Speak with your caregiver about any limitations on activity for underlying diseases you may have. SEEK MEDICAL CARE IF:   Your condition is not improving after your transfusion.  You develop redness or irritation at the intravenous (IV) site. SEEK IMMEDIATE MEDICAL CARE IF:  Any of the following symptoms occur over the next 12 hours:  Shaking chills.  You have a temperature by mouth above 102 F (38.9 C), not controlled by  medicine.  Chest, back, or muscle pain.  People around you feel you are not acting correctly or are confused.  Shortness of breath or difficulty breathing.  Dizziness and fainting.  You get a rash or develop hives.  You have a decrease in urine output.  Your urine turns a dark color or changes to pink, red, or brown. Any of the following symptoms occur over the next 10 days:  You have a temperature by mouth above 102 F (38.9 C), not controlled by medicine.  Shortness of breath.  Weakness after normal activity.  The white part of the eye turns yellow (jaundice).  You have a decrease in the amount of urine or are urinating less often.  Your urine turns a dark color or changes to pink, red, or brown. Document Released: 10/11/2000 Document Revised: 01/06/2012 Document Reviewed: 05/30/2008 [facility] Patient Information 2014 [place], [place].  _______________________________________________________________________

## 2017-11-12 ENCOUNTER — Other Ambulatory Visit: Payer: Self-pay

## 2017-11-12 ENCOUNTER — Encounter (HOSPITAL_COMMUNITY)
Admission: RE | Admit: 2017-11-12 | Discharge: 2017-11-12 | Disposition: A | Discharge status: Home / Self Care | Payer: BLUE CROSS/BLUE SHIELD | Source: Ambulatory Visit | Attending: Orthopedic Surgery | Admitting: Orthopedic Surgery

## 2017-11-12 ENCOUNTER — Ambulatory Visit (HOSPITAL_COMMUNITY)
Admission: RE | Admit: 2017-11-12 | Discharge: 2017-11-12 | Disposition: A | Discharge status: Home / Self Care | Payer: BLUE CROSS/BLUE SHIELD | Source: Ambulatory Visit | Attending: Surgical | Admitting: Surgical

## 2017-11-12 ENCOUNTER — Encounter (HOSPITAL_COMMUNITY): Payer: Self-pay

## 2017-11-12 DIAGNOSIS — Z01810 Encounter for preprocedural cardiovascular examination: Secondary | ICD-10-CM | POA: Insufficient documentation

## 2017-11-12 DIAGNOSIS — Z01818 Encounter for other preprocedural examination: Secondary | ICD-10-CM | POA: Diagnosis present

## 2017-11-12 DIAGNOSIS — Z01812 Encounter for preprocedural laboratory examination: Secondary | ICD-10-CM | POA: Insufficient documentation

## 2017-11-12 DIAGNOSIS — M1711 Unilateral primary osteoarthritis, right knee: Secondary | ICD-10-CM | POA: Insufficient documentation

## 2017-11-12 LAB — CBC WITH DIFFERENTIAL/PLATELET
Basophils Absolute: 0 10*3/uL (ref 0.0–0.1)
Basophils Relative: 0 %
Eosinophils Absolute: 0.1 10*3/uL (ref 0.0–0.7)
Eosinophils Relative: 2 %
HCT: 44.3 % (ref 36.0–46.0)
Hemoglobin: 14.9 g/dL (ref 12.0–15.0)
Lymphocytes Relative: 28 %
Lymphs Abs: 2.2 10*3/uL (ref 0.7–4.0)
MCH: 29.6 pg (ref 26.0–34.0)
MCHC: 33.6 g/dL (ref 30.0–36.0)
MCV: 88.1 fL (ref 78.0–100.0)
Monocytes Absolute: 0.5 10*3/uL (ref 0.1–1.0)
Monocytes Relative: 6 %
Neutro Abs: 5.2 10*3/uL (ref 1.7–7.7)
Neutrophils Relative %: 64 %
Platelets: 268 10*3/uL (ref 150–400)
RBC: 5.03 MIL/uL (ref 3.87–5.11)
RDW: 13.5 % (ref 11.5–15.5)
WBC: 8 10*3/uL (ref 4.0–10.5)

## 2017-11-12 LAB — SURGICAL PCR SCREEN
MRSA, PCR: NEGATIVE
STAPHYLOCOCCUS AUREUS: NEGATIVE

## 2017-11-12 LAB — URINALYSIS, ROUTINE W REFLEX MICROSCOPIC
Bacteria, UA: NONE SEEN
Bilirubin Urine: NEGATIVE
Glucose, UA: NEGATIVE mg/dL
Ketones, ur: NEGATIVE mg/dL
Leukocytes, UA: NEGATIVE
Nitrite: NEGATIVE
Protein, ur: NEGATIVE mg/dL
Specific Gravity, Urine: 1.011 (ref 1.005–1.030)
pH: 6 (ref 5.0–8.0)

## 2017-11-12 LAB — COMPREHENSIVE METABOLIC PANEL
ALT: 16 U/L (ref 14–54)
AST: 17 U/L (ref 15–41)
Albumin: 3.7 g/dL (ref 3.5–5.0)
Alkaline Phosphatase: 93 U/L (ref 38–126)
Anion gap: 6 (ref 5–15)
BUN: 19 mg/dL (ref 6–20)
CO2: 28 mmol/L (ref 22–32)
Calcium: 9 mg/dL (ref 8.9–10.3)
Chloride: 104 mmol/L (ref 101–111)
Creatinine, Ser: 0.77 mg/dL (ref 0.44–1.00)
GFR calc Af Amer: >60 mL/min (ref >60)
GFR calc non Af Amer: >60 mL/min (ref >60)
Glucose, Bld: 98 mg/dL (ref 65–99)
Potassium: 4.2 mmol/L (ref 3.5–5.1)
Sodium: 138 mmol/L (ref 135–145)
Total Bilirubin: 0.6 mg/dL (ref 0.3–1.2)
Total Protein: 6.8 g/dL (ref 6.5–8.1)

## 2017-11-12 LAB — PROTIME-INR
INR: 0.97
Prothrombin Time: 12.8 seconds (ref 11.4–15.2)

## 2017-11-12 LAB — APTT: aPTT: 26 seconds (ref 24–36)

## 2017-11-12 LAB — ABO/RH: ABO/RH(D): A POS

## 2017-11-17 NOTE — H&P (Signed)
TOTAL KNEE ADMISSION H&P  Patient is being admitted for right total knee arthroplasty.  Subjective:  Chief Complaint:right knee pain.  HPI: Jenna Smith, 67 y.o. female, has a history of pain and functional disability in the right knee due to arthritis and has failed non-surgical conservative treatments for greater than 12 weeks to includeNSAID's and/or analgesics, corticosteriod injections, flexibility and strengthening excercises and activity modification.  Onset of symptoms was gradual, starting 3 years ago with gradually worsening course since that time. The patient noted prior procedures on the knee to include  arthroscopy and menisectomy on the right knee(s).  Patient currently rates pain in the right knee(s) at 8 out of 10 with activity. Patient has night pain, worsening of pain with activity and weight bearing, pain that interferes with activities of daily living, pain with passive range of motion, crepitus and joint swelling.  Patient has evidence of periarticular osteophytes and joint space narrowing by imaging studies. There is no active infection.  Patient Active Problem List   Diagnosis Date Noted  . Tendonitis of knee, right 06/11/2017  . Obesity (BMI 30-39.9) 06/21/2016  . Allergic rhinitis 02/26/2016  . Right knee pain 02/26/2016   Past Medical History:  Diagnosis Date  . Allergy     Past Surgical History:  Procedure Laterality Date  . BREAST SURGERY Bilateral May 2001   Breast reduction  . COSMETIC SURGERY  September 2012   Face lift  . TONSILLECTOMY        Current Outpatient Medications:  .  cetirizine (ZYRTEC) 10 MG tablet, Take 10 mg by mouth daily as needed for allergies.   Allergies  Allergen Reactions  . Indomethacin Swelling    Face swells up  . Latex Hives  . Neosporin [Neomycin-Bacitracin Zn-Polymyx] Hives  . Penicillins Hives    Has patient had a PCN reaction causing immediate rash, facial/tongue/throat swelling, SOB or lightheadedness with  hypotension: no Has patient had a PCN reaction causing severe rash involving mucus membranes or skin necrosis: no Has patient had a PCN reaction that required hospitalization: no Has patient had a PCN reaction occurring within the last 10 years: no If all of the above answers are "NO", then may proceed with Cephalosporin use.     Social History   Tobacco Use  . Smoking status: Former Smoker    Packs/day: 0.30    Years: 15.00    Pack years: 4.50    Types: Cigarettes    Last attempt to quit: 10/28/1984    Years since quitting: 33.0  . Smokeless tobacco: Never Used  . Tobacco comment: smoked "off and on" x 15 yrs  Substance Use Topics  . Alcohol use: No    Alcohol/week: 0.0 oz    Comment: Holidays - occasional wine    Family History  Problem Relation Age of Onset  . Aneurysm Mother        passed away at age 56  . Cancer Father        passed away after Prostate cancer metastesize  . Cancer Sister        she passed away 68 due to Breast Cancer  . Stroke Sister   . Aneurysm Sister      Review of Systems  Constitutional: Negative.   HENT: Negative.   Eyes: Negative.   Respiratory: Positive for shortness of breath. Negative for cough, hemoptysis, sputum production and wheezing.        SOB with exertion  Cardiovascular: Negative.   Gastrointestinal: Negative.   Genitourinary:  Negative.   Musculoskeletal: Positive for joint pain and myalgias. Negative for back pain, falls and neck pain.  Skin: Negative.   Neurological: Negative.   Endo/Heme/Allergies: Negative.   Psychiatric/Behavioral: Negative.     Objective:  Physical Exam  Constitutional: She is oriented to person, place, and time. She appears well-developed. No distress.  Overweight  HENT:  Head: Normocephalic and atraumatic.  Right Ear: External ear normal.  Left Ear: External ear normal.  Nose: Nose normal.  Mouth/Throat: Oropharynx is clear and moist.  Eyes: Conjunctivae and EOM are normal.  Neck: Normal  range of motion. Neck supple.  Cardiovascular: Normal rate, regular rhythm, normal heart sounds and intact distal pulses.  Respiratory: Effort normal and breath sounds normal.  GI: Soft. Bowel sounds are normal. She exhibits no distension. There is no tenderness.  Musculoskeletal:       Right hip: Normal.       Left hip: Normal.       Right knee: She exhibits decreased range of motion, swelling and bony tenderness. She exhibits no effusion and no erythema. Tenderness found. Medial joint line tenderness noted. No lateral joint line tenderness noted.       Left knee: Normal.  Neurological: She is alert and oriented to person, place, and time. She has normal strength. No sensory deficit.  Skin: No rash noted. She is not diaphoretic. No erythema.  Psychiatric: She has a normal mood and affect. Her behavior is normal.   Vitals Ht: 5 ft 6 in  Wt: 176 lbs  BMI: 28.4  BP: 118/74 sitting L arm  Pulse: 76 bpm regular  Pain Scale: 5   Imaging Review Plain radiographs demonstrate severe degenerative joint disease of the right knee(s). The overall alignment ismild varus. The bone quality appears to be good for age and reported activity level.  Assessment/Plan:  End stage primary osteoarthritis, right knee   The patient history, physical examination, clinical judgment of the provider and imaging studies are consistent with end stage degenerative joint disease of the right knee(s) and total knee arthroplasty is deemed medically necessary. The treatment options including medical management, injection therapy arthroscopy and arthroplasty were discussed at length. The risks and benefits of total knee arthroplasty were presented and reviewed. The risks due to aseptic loosening, infection, stiffness, patella tracking problems, thromboembolic complications and other imponderables were discussed. The patient acknowledged the explanation, agreed to proceed with the plan and consent was signed. Patient is being  admitted for inpatient treatment for surgery, pain control, PT, OT, prophylactic antibiotics, VTE prophylaxis, progressive ambulation and ADL's and discharge planning. The patient is planning to be discharged home with home health services    DGL:OVFIEPP [facility] TXA IV   Jacorion Klem, [place]

## 2017-11-18 MED ORDER — TRANEXAMIC ACID 1000 MG/10ML IV SOLN
1000.0000 mg | INTRAVENOUS | Status: AC
  Administered 2017-11-19: 1000 mg via INTRAVENOUS
  Filled 2017-11-18: qty 1100

## 2017-11-19 ENCOUNTER — Inpatient Hospital Stay (HOSPITAL_COMMUNITY): Payer: BLUE CROSS/BLUE SHIELD | Admitting: Anesthesiology

## 2017-11-19 ENCOUNTER — Other Ambulatory Visit: Payer: Self-pay

## 2017-11-19 ENCOUNTER — Inpatient Hospital Stay (HOSPITAL_COMMUNITY)
Admission: RE | Admit: 2017-11-19 | Discharge: 2017-11-21 | DRG: 470 | Disposition: A | Discharge status: Home Health | Payer: BLUE CROSS/BLUE SHIELD | Source: Ambulatory Visit | Attending: Orthopedic Surgery | Admitting: Orthopedic Surgery

## 2017-11-19 ENCOUNTER — Encounter (HOSPITAL_COMMUNITY)
Admission: RE | Disposition: A | Discharge status: Home Health | Payer: Self-pay | Source: Ambulatory Visit | Attending: Orthopedic Surgery

## 2017-11-19 ENCOUNTER — Encounter (HOSPITAL_COMMUNITY): Payer: Self-pay | Admitting: Anesthesiology

## 2017-11-19 DIAGNOSIS — Z91040 Latex allergy status: Secondary | ICD-10-CM

## 2017-11-19 DIAGNOSIS — J309 Allergic rhinitis, unspecified: Secondary | ICD-10-CM | POA: Diagnosis present

## 2017-11-19 DIAGNOSIS — Z803 Family history of malignant neoplasm of breast: Secondary | ICD-10-CM | POA: Diagnosis not present

## 2017-11-19 DIAGNOSIS — Z6828 Body mass index (BMI) 28.0-28.9, adult: Secondary | ICD-10-CM | POA: Diagnosis not present

## 2017-11-19 DIAGNOSIS — Z87891 Personal history of nicotine dependence: Secondary | ICD-10-CM | POA: Diagnosis not present

## 2017-11-19 DIAGNOSIS — Z823 Family history of stroke: Secondary | ICD-10-CM

## 2017-11-19 DIAGNOSIS — Z79899 Other long term (current) drug therapy: Secondary | ICD-10-CM | POA: Diagnosis not present

## 2017-11-19 DIAGNOSIS — Z880 Allergy status to penicillin: Secondary | ICD-10-CM | POA: Diagnosis not present

## 2017-11-19 DIAGNOSIS — M1711 Unilateral primary osteoarthritis, right knee: Secondary | ICD-10-CM | POA: Diagnosis present

## 2017-11-19 DIAGNOSIS — E669 Obesity, unspecified: Secondary | ICD-10-CM | POA: Diagnosis present

## 2017-11-19 DIAGNOSIS — Z888 Allergy status to other drugs, medicaments and biological substances status: Secondary | ICD-10-CM | POA: Diagnosis not present

## 2017-11-19 DIAGNOSIS — Z96659 Presence of unspecified artificial knee joint: Secondary | ICD-10-CM

## 2017-11-19 HISTORY — PX: TOTAL KNEE ARTHROPLASTY: SHX125

## 2017-11-19 LAB — TYPE AND SCREEN
ABO/RH(D): A POS
Antibody Screen: NEGATIVE

## 2017-11-19 SURGERY — ARTHROPLASTY, KNEE, TOTAL
Anesthesia: General | Site: Knee | Laterality: Right

## 2017-11-19 MED ORDER — ROPIVACAINE HCL 7.5 MG/ML IJ SOLN
INTRAMUSCULAR | Status: DC | PRN
  Administered 2017-11-19: 20 mL via PERINEURAL

## 2017-11-19 MED ORDER — LACTATED RINGERS IV SOLN
INTRAVENOUS | Status: DC
  Administered 2017-11-19: 1000 mL via INTRAVENOUS
  Administered 2017-11-19: 14:00:00 via INTRAVENOUS

## 2017-11-19 MED ORDER — CHLORHEXIDINE GLUCONATE 4 % EX LIQD: 60.0000 mL | Freq: Once | CUTANEOUS | Status: DC

## 2017-11-19 MED ORDER — HYDROMORPHONE HCL 1 MG/ML IJ SOLN
0.2500 mg | INTRAMUSCULAR | Status: DC | PRN
  Administered 2017-11-19 (×2): 0.5 mg via INTRAVENOUS

## 2017-11-19 MED ORDER — PHENYLEPHRINE 40 MCG/ML (10ML) SYRINGE FOR IV PUSH (FOR BLOOD PRESSURE SUPPORT)
PREFILLED_SYRINGE | INTRAVENOUS | Status: AC
  Filled 2017-11-19: qty 10

## 2017-11-19 MED ORDER — ONDANSETRON HCL 4 MG PO TABS: 4.0000 mg | ORAL_TABLET | Freq: Four times a day (QID) | ORAL | Status: DC | PRN

## 2017-11-19 MED ORDER — ACETAMINOPHEN 650 MG RE SUPP: 650.0000 mg | RECTAL | Status: DC | PRN

## 2017-11-19 MED ORDER — BUPIVACAINE HCL (PF) 0.25 % IJ SOLN
INTRAMUSCULAR | Status: AC
  Filled 2017-11-19: qty 30

## 2017-11-19 MED ORDER — BISACODYL 5 MG PO TBEC: 5.0000 mg | DELAYED_RELEASE_TABLET | Freq: Every day | ORAL | Status: DC | PRN

## 2017-11-19 MED ORDER — ONDANSETRON HCL 4 MG/2ML IJ SOLN: 4.0000 mg | Freq: Four times a day (QID) | INTRAMUSCULAR | Status: DC | PRN

## 2017-11-19 MED ORDER — PROPOFOL 10 MG/ML IV BOLUS
INTRAVENOUS | Status: AC
  Filled 2017-11-19: qty 20

## 2017-11-19 MED ORDER — CEFAZOLIN SODIUM-DEXTROSE 1-4 GM/50ML-% IV SOLN: 1.0000 g | Freq: Four times a day (QID) | INTRAVENOUS | Status: DC

## 2017-11-19 MED ORDER — ACETAMINOPHEN 325 MG PO TABS: 650.0000 mg | ORAL_TABLET | ORAL | Status: DC | PRN

## 2017-11-19 MED ORDER — SODIUM CHLORIDE 0.9 % IV SOLN
INTRAVENOUS | Status: AC
  Filled 2017-11-19: qty 500000

## 2017-11-19 MED ORDER — LACTATED RINGERS IV SOLN: INTRAVENOUS | Status: DC

## 2017-11-19 MED ORDER — FENTANYL CITRATE (PF) 100 MCG/2ML IJ SOLN
50.0000 ug | INTRAMUSCULAR | Status: DC
  Administered 2017-11-19: 50 ug via INTRAVENOUS
  Filled 2017-11-19: qty 2

## 2017-11-19 MED ORDER — BUPIVACAINE LIPOSOME 1.3 % IJ SUSP
20.0000 mL | Freq: Once | INTRAMUSCULAR | Status: DC
  Filled 2017-11-19: qty 20

## 2017-11-19 MED ORDER — METHOCARBAMOL 500 MG PO TABS
500.0000 mg | ORAL_TABLET | Freq: Four times a day (QID) | ORAL | Status: DC | PRN
  Administered 2017-11-20 – 2017-11-21 (×4): 500 mg via ORAL
  Filled 2017-11-19 (×5): qty 1

## 2017-11-19 MED ORDER — HYDROMORPHONE HCL 1 MG/ML IJ SOLN
INTRAMUSCULAR | Status: AC
  Filled 2017-11-19: qty 2

## 2017-11-19 MED ORDER — HYDROMORPHONE HCL 1 MG/ML IJ SOLN
0.5000 mg | INTRAMUSCULAR | Status: DC | PRN
  Administered 2017-11-19 – 2017-11-20 (×2): 0.5 mg via INTRAVENOUS
  Filled 2017-11-19: qty 0.5

## 2017-11-19 MED ORDER — MIDAZOLAM HCL 2 MG/2ML IJ SOLN
INTRAMUSCULAR | Status: AC
  Filled 2017-11-19: qty 2

## 2017-11-19 MED ORDER — MENTHOL 3 MG MT LOZG: 1.0000 | LOZENGE | OROMUCOSAL | Status: DC | PRN

## 2017-11-19 MED ORDER — HYDROCODONE-ACETAMINOPHEN 5-325 MG PO TABS
1.0000 | ORAL_TABLET | ORAL | Status: DC | PRN
  Administered 2017-11-21 (×2): 1 via ORAL
  Filled 2017-11-19 (×2): qty 1

## 2017-11-19 MED ORDER — PROPOFOL 10 MG/ML IV BOLUS
INTRAVENOUS | Status: AC
  Filled 2017-11-19: qty 40

## 2017-11-19 MED ORDER — VANCOMYCIN HCL IN DEXTROSE 1-5 GM/200ML-% IV SOLN
1000.0000 mg | Freq: Once | INTRAVENOUS | Status: AC
  Administered 2017-11-19: 1000 mg via INTRAVENOUS
  Filled 2017-11-19: qty 200

## 2017-11-19 MED ORDER — SODIUM CHLORIDE 0.9 % IJ SOLN
INTRAMUSCULAR | Status: AC
  Filled 2017-11-19: qty 50

## 2017-11-19 MED ORDER — PHENOL 1.4 % MT LIQD
1.0000 | OROMUCOSAL | Status: DC | PRN
  Filled 2017-11-19: qty 177

## 2017-11-19 MED ORDER — FLEET ENEMA 7-19 GM/118ML RE ENEM: 1.0000 | ENEMA | Freq: Once | RECTAL | Status: DC | PRN

## 2017-11-19 MED ORDER — DEXAMETHASONE SODIUM PHOSPHATE 10 MG/ML IJ SOLN
INTRAMUSCULAR | Status: AC
  Filled 2017-11-19: qty 1

## 2017-11-19 MED ORDER — RIVAROXABAN 10 MG PO TABS
10.0000 mg | ORAL_TABLET | Freq: Every day | ORAL | Status: DC
  Administered 2017-11-20 – 2017-11-21 (×2): 10 mg via ORAL
  Filled 2017-11-19 (×2): qty 1

## 2017-11-19 MED ORDER — PROPOFOL 500 MG/50ML IV EMUL
INTRAVENOUS | Status: DC | PRN
  Administered 2017-11-19: 50 ug/kg/min via INTRAVENOUS

## 2017-11-19 MED ORDER — POLYETHYLENE GLYCOL 3350 17 G PO PACK: 17.0000 g | PACK | Freq: Every day | ORAL | Status: DC | PRN

## 2017-11-19 MED ORDER — ONDANSETRON HCL 4 MG/2ML IJ SOLN
INTRAMUSCULAR | Status: AC
  Filled 2017-11-19: qty 2

## 2017-11-19 MED ORDER — PHENYLEPHRINE 40 MCG/ML (10ML) SYRINGE FOR IV PUSH (FOR BLOOD PRESSURE SUPPORT)
PREFILLED_SYRINGE | INTRAVENOUS | Status: DC | PRN
  Administered 2017-11-19: 80 ug via INTRAVENOUS

## 2017-11-19 MED ORDER — PROMETHAZINE HCL 25 MG/ML IJ SOLN: 6.2500 mg | INTRAMUSCULAR | Status: DC | PRN

## 2017-11-19 MED ORDER — BUPIVACAINE IN DEXTROSE 0.75-8.25 % IT SOLN
INTRATHECAL | Status: DC | PRN
  Administered 2017-11-19: 2 mL via INTRATHECAL

## 2017-11-19 MED ORDER — VANCOMYCIN HCL IN DEXTROSE 1-5 GM/200ML-% IV SOLN
1000.0000 mg | INTRAVENOUS | Status: AC
  Administered 2017-11-19: 1000 mg via INTRAVENOUS
  Filled 2017-11-19: qty 200

## 2017-11-19 MED ORDER — FENTANYL CITRATE (PF) 100 MCG/2ML IJ SOLN
INTRAMUSCULAR | Status: DC | PRN
  Administered 2017-11-19: 50 ug via INTRAVENOUS

## 2017-11-19 MED ORDER — PROPOFOL 10 MG/ML IV BOLUS
INTRAVENOUS | Status: DC | PRN
  Administered 2017-11-19: 50 mg via INTRAVENOUS

## 2017-11-19 MED ORDER — BUPIVACAINE LIPOSOME 1.3 % IJ SUSP
INTRAMUSCULAR | Status: DC | PRN
  Administered 2017-11-19: 40 mL

## 2017-11-19 MED ORDER — FENTANYL CITRATE (PF) 250 MCG/5ML IJ SOLN
INTRAMUSCULAR | Status: AC
  Filled 2017-11-19: qty 5

## 2017-11-19 MED ORDER — MIDAZOLAM HCL 2 MG/2ML IJ SOLN
1.0000 mg | INTRAMUSCULAR | Status: DC
  Administered 2017-11-19: 2 mg via INTRAVENOUS
  Filled 2017-11-19: qty 2

## 2017-11-19 MED ORDER — ONDANSETRON HCL 4 MG/2ML IJ SOLN
INTRAMUSCULAR | Status: DC | PRN
  Administered 2017-11-19: 4 mg via INTRAVENOUS

## 2017-11-19 MED ORDER — PHENYLEPHRINE HCL 10 MG/ML IJ SOLN
INTRAMUSCULAR | Status: AC
  Filled 2017-11-19: qty 1

## 2017-11-19 MED ORDER — DEXTROSE 5 % IV SOLN
INTRAVENOUS | Status: DC | PRN
  Administered 2017-11-19: 40 ug/min via INTRAVENOUS

## 2017-11-19 MED ORDER — HYDROCODONE-ACETAMINOPHEN 10-325 MG PO TABS
2.0000 | ORAL_TABLET | ORAL | Status: DC | PRN
  Administered 2017-11-19 – 2017-11-20 (×7): 1 via ORAL
  Filled 2017-11-19 (×6): qty 2

## 2017-11-19 MED ORDER — SODIUM CHLORIDE 0.9 % IJ SOLN
INTRAMUSCULAR | Status: DC | PRN
  Administered 2017-11-19: 20 mL

## 2017-11-19 MED ORDER — ALUM & MAG HYDROXIDE-SIMETH 200-200-20 MG/5ML PO SUSP: 30.0000 mL | ORAL | Status: DC | PRN

## 2017-11-19 MED ORDER — METHOCARBAMOL 1000 MG/10ML IJ SOLN
500.0000 mg | Freq: Four times a day (QID) | INTRAMUSCULAR | Status: DC | PRN
  Administered 2017-11-19 (×2): 500 mg via INTRAVENOUS
  Filled 2017-11-19 (×2): qty 550

## 2017-11-19 MED ORDER — FERROUS SULFATE 325 (65 FE) MG PO TABS
325.0000 mg | ORAL_TABLET | Freq: Three times a day (TID) | ORAL | Status: DC
  Administered 2017-11-20 – 2017-11-21 (×2): 325 mg via ORAL
  Filled 2017-11-19 (×2): qty 1

## 2017-11-19 MED ORDER — MIDAZOLAM HCL 5 MG/5ML IJ SOLN
INTRAMUSCULAR | Status: DC | PRN
  Administered 2017-11-19: 1 mg via INTRAVENOUS

## 2017-11-19 MED ORDER — KETOROLAC TROMETHAMINE 30 MG/ML IJ SOLN: 30.0000 mg | Freq: Once | INTRAMUSCULAR | Status: DC | PRN

## 2017-11-19 SURGICAL SUPPLY — 61 items
BAG DECANTER FOR FLEXI CONT (MISCELLANEOUS) ×3 IMPLANT
BAG ZIPLOCK 12X15 (MISCELLANEOUS) IMPLANT
BANDAGE ACE 4X5 VEL STRL LF (GAUZE/BANDAGES/DRESSINGS) ×3 IMPLANT
BANDAGE ACE 6X5 VEL STRL LF (GAUZE/BANDAGES/DRESSINGS) ×3 IMPLANT
BLADE SAG 18X100X1.27 (BLADE) ×3 IMPLANT
BLADE SAW SGTL 11.0X1.19X90.0M (BLADE) ×3 IMPLANT
BNDG GAUZE ELAST 4 BULKY (GAUZE/BANDAGES/DRESSINGS) ×6 IMPLANT
BONE CEMENT GENTAMICIN (Cement) ×6 IMPLANT
CAP KNEE TOTAL 3 SIGMA ×3 IMPLANT
CEMENT BONE GENTAMICIN 40 (Cement) ×2 IMPLANT
CLOSURE WOUND 1/2 X4 (GAUZE/BANDAGES/DRESSINGS) ×2
COVER SURGICAL LIGHT HANDLE (MISCELLANEOUS) ×3 IMPLANT
CUFF TOURN SGL QUICK 34 (TOURNIQUET CUFF) ×2
CUFF TRNQT CYL 34X4X40X1 (TOURNIQUET CUFF) ×1 IMPLANT
DECANTER SPIKE VIAL GLASS SM (MISCELLANEOUS) IMPLANT
DRAPE INCISE IOBAN 66X45 STRL (DRAPES) ×3 IMPLANT
DRAPE U-SHAPE 47X51 STRL (DRAPES) ×3 IMPLANT
DRSG ADAPTIC 3X8 NADH LF (GAUZE/BANDAGES/DRESSINGS) ×3 IMPLANT
DRSG PAD ABDOMINAL 8X10 ST (GAUZE/BANDAGES/DRESSINGS) ×6 IMPLANT
DURAPREP 26ML APPLICATOR (WOUND CARE) ×3 IMPLANT
ELECT REM PT RETURN 15FT ADLT (MISCELLANEOUS) ×3 IMPLANT
EVACUATOR 1/8 PVC DRAIN (DRAIN) ×3 IMPLANT
FACESHIELD WRAPAROUND (MASK) ×12 IMPLANT
GAUZE SPONGE 4X4 12PLY STRL (GAUZE/BANDAGES/DRESSINGS) ×3 IMPLANT
GLOVE BIOGEL PI IND STRL 6.5 (GLOVE) ×6 IMPLANT
GLOVE BIOGEL PI IND STRL 8.5 (GLOVE) ×1 IMPLANT
GLOVE BIOGEL PI INDICATOR 6.5 (GLOVE) ×12
GLOVE BIOGEL PI INDICATOR 8.5 (GLOVE) ×2
GLOVE ECLIPSE 8.0 STRL XLNG CF (GLOVE) ×6 IMPLANT
GLOVE SURG SS PI 6.5 STRL IVOR (GLOVE) ×3 IMPLANT
GOWN STRL REUS W/TWL LRG LVL3 (GOWN DISPOSABLE) ×3 IMPLANT
GOWN STRL REUS W/TWL XL LVL3 (GOWN DISPOSABLE) ×9 IMPLANT
HANDPIECE INTERPULSE COAX TIP (DISPOSABLE) ×2
HEMOSTAT SPONGE AVITENE ULTRA (HEMOSTASIS) ×3 IMPLANT
IMMOBILIZER KNEE 20 (SOFTGOODS) ×3
IMMOBILIZER KNEE 20 THIGH 36 (SOFTGOODS) ×1 IMPLANT
MANIFOLD NEPTUNE II (INSTRUMENTS) ×3 IMPLANT
NEEDLE HYPO 21X1.5 SAFETY (NEEDLE) IMPLANT
NEEDLE HYPO 22GX1.5 SAFETY (NEEDLE) IMPLANT
NS IRRIG 1000ML POUR BTL (IV SOLUTION) ×3 IMPLANT
PACK TOTAL KNEE CUSTOM (KITS) ×3 IMPLANT
PADDING CAST COTTON 6X4 STRL (CAST SUPPLIES) ×3 IMPLANT
PENCIL SMOKE EVAC W/HOLSTER (ELECTROSURGICAL) ×3 IMPLANT
POSITIONER SURGICAL ARM (MISCELLANEOUS) ×3 IMPLANT
SET HNDPC FAN SPRY TIP SCT (DISPOSABLE) ×1 IMPLANT
SET PAD KNEE POSITIONER (MISCELLANEOUS) ×3 IMPLANT
SPONGE LAP 18X18 X RAY DECT (DISPOSABLE) IMPLANT
STRIP CLOSURE SKIN 1/2X4 (GAUZE/BANDAGES/DRESSINGS) ×4 IMPLANT
SUT BONE WAX W31G (SUTURE) IMPLANT
SUT MNCRL AB 4-0 PS2 18 (SUTURE) ×3 IMPLANT
SUT VIC AB 1 CT1 27 (SUTURE) ×4
SUT VIC AB 1 CT1 27XBRD ANTBC (SUTURE) ×2 IMPLANT
SUT VIC AB 2-0 CT1 27 (SUTURE) ×6
SUT VIC AB 2-0 CT1 TAPERPNT 27 (SUTURE) ×3 IMPLANT
SUT VLOC 180 0 24IN GS25 (SUTURE) ×3 IMPLANT
SYR 20CC LL (SYRINGE) ×6 IMPLANT
TOWER CARTRIDGE SMART MIX (DISPOSABLE) ×3 IMPLANT
TRAY FOLEY W/METER SILVER 16FR (SET/KITS/TRAYS/PACK) ×3 IMPLANT
WATER STERILE IRR 1000ML POUR (IV SOLUTION) ×6 IMPLANT
WRAP KNEE MAXI GEL POST OP (GAUZE/BANDAGES/DRESSINGS) ×3 IMPLANT
YANKAUER SUCT BULB TIP 10FT TU (MISCELLANEOUS) ×3 IMPLANT

## 2017-11-19 NOTE — Interval H&P Note (Signed)
History and Physical Interval Note:  11/19/2017 1:06 PM  Jenna Smith  has presented today for surgery, with the diagnosis of Right knee osteoarthritis  The various methods of treatment have been discussed with the patient and family. After consideration of risks, benefits and other options for treatment, the patient has consented to  Procedure(s): RIGHT TOTAL KNEE ARTHROPLASTY (Right) as a surgical intervention .  The patient's history has been reviewed, patient examined, no change in status, stable for surgery.  I have reviewed the patient's chart and labs.  Questions were answered to the patient's satisfaction.     [name]

## 2017-11-19 NOTE — Anesthesia Postprocedure Evaluation (Signed)
Anesthesia Post Note  Patient: Jenna Smith  Procedure(s) Performed: RIGHT TOTAL KNEE ARTHROPLASTY (Right Knee)     Patient location during evaluation: PACU Anesthesia Type: General Level of consciousness: oriented and awake and alert Pain management: pain level controlled Vital Signs Assessment: post-procedure vital signs reviewed and stable Respiratory status: spontaneous breathing, respiratory function stable and patient connected to nasal cannula oxygen Cardiovascular status: blood pressure returned to baseline and stable Postop Assessment: no headache, no backache and no apparent nausea or vomiting Anesthetic complications: no    Last Vitals:  Vitals:   11/19/17 1258 11/19/17 1521  BP: 102/73   Pulse:  (P) 75  Resp:  15  Temp:  (!) 36.4 C  SpO2:  (P) 100%    Last Pain:  Vitals:   11/19/17 1530  TempSrc:   PainSc: (P) 3                  Trecia Maring S

## 2017-11-19 NOTE — Brief Op Note (Signed)
11/19/2017  2:50 PM  PATIENT:  Jenna Smith  67 y.o. 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  POST-OPERATIVE DIAGNOSIS:  right knee osteoarthritis with a Flexion Contracture  PROCEDURE:  Procedure(s): RIGHT TOTAL KNEE ARTHROPLASTY (Right) with release of a Flexion Contracture  SURGEON:  Surgeon(s) and Role:    [name], MD - Primary  PHYSICIAN ASSISTANT:Amber Wylandville PA   ASSISTANTS: Ardeen Jourdain PA  ANESTHESIA:   spinal  EBL:  None   BLOOD ADMINISTERED:none  DRAINS: (one) Hemovact drain(s) in the Right Knee with  Suction Open   LOCAL MEDICATIONS USED:  OTHER 20cc of Exparel mixed with 20cc of Normal Saline  SPECIMEN:  No Specimen  DISPOSITION OF SPECIMEN:  N/A  COUNTS:  YES  TOURNIQUET:  * Missing tourniquet times found for documented tourniquets in log: [id number] *  DICTATION: .Other Dictation: Dictation Number  [id number]  PLAN OF CARE: Admit to inpatient   PATIENT DISPOSITION:  Stable in OR   Delay start of Pharmacological VTE agent (>24hrs) due to surgical blood loss or risk of bleeding: yes

## 2017-11-19 NOTE — Op Note (Signed)
NAMEMarland Kitchen  Jenna Smith             ACCOUNT NO.:  [id number]  MEDICAL RECORD NO.:  [id number]  LOCATION:                                 FACILITY:  PHYSICIAN:  Georges Lynch. Meriah Shands, M.D.DATE OF BIRTH:  1951-04-12  DATE OF PROCEDURE:  11/19/2017 DATE OF DISCHARGE:                              OPERATIVE REPORT   SURGEON:  Georges Lynch. Darrelyn Hillock, M.D.  ASSISTANT:  Dimitri Ped, [place]  PREOPERATIVE DIAGNOSES: 1. Severe primary osteoarthritis of the right knee. 2. Flexion contracture, right knee.  POSTOPERATIVE DIAGNOSES: 1. Severe primary osteoarthritis of the right knee. 2. Flexion contracture, right knee.  OPERATION:  Right total knee arthroplasty utilizing the DePuy system.  I cemented all three components in simultaneously and gentamicin was used in the cement.  I also released the flexion contracture.  The sizes used was a size 3 right femoral component, posterior cruciate, sacrificing type.  The tray was a size 3.  The insert was a size 3, 15-mm thickness. The patella was a size 38 with 3 pegs.  At this time, after the appropriate time-out and after I marked the appropriate right leg in the holding area, the sterile prep and draping was carried out.  The leg was exsanguinated and Esmarch tourniquet was elevated to 325 mmHg.  The right leg then was placed in a DeMayo knee holder.  Anterior approach to the knee was carried out.  Two flaps were created.  I then carried out a median parapatellar approach, reflected the patella laterally.  With the knee flexed, I did medial and lateral meniscectomies and excised the anterior and posterior cruciate ligaments.  Following that, initial drill hole was made in the intercondylar notch in usual fashion.  The guide rod was inserted up the canal to make sure we were properly in the canal.  We then removed the guide rod, thoroughly irrigated out the canal.  I then removed 12-mm thickness off the distal femur at this time.  Following that,  we measured the distal femur to be a size 3.  We did anterior, posterior and chamfering cuts for size 3 femoral component.  At this time, we then approached the tibia.  We checked the tray size, was estimated to be a size 3.  We then removed 4 mm thickness off the tibial plateau in usual fashion utilizing the external guide.  At this particular time, we then inserted our lamina spreaders and continued our soft tissue removal.  We made sure that the menisci were totally removed and we had a good clear surface with the tibial plateau.  We then inserted our appropriate spacer blocks.  We then removed that and continued to cut our keel cut out of the proximal tibial plateau in usual fashion.  We then did our notch cut out of the distal femur for size 3.  Following that, it was water picked.  The trial components were inserted.  We kept the knee in extension.  I then did a resurfacing procedure on the patella in usual fashion.  The patella was measured to be a size 38.  Three drill holes were made in the articular surface of the patella.  At this time, we removed all  trial components, thoroughly water picked out the knee.  I inserted all three components in simultaneously.  As the cement was hardened, we then removed all loose pieces of cement, water picked the knee out again.  I went through several trials and finally, selected a 15-mm thickness insert.  I tried a 17, it was entirely too tight.  We then inserted our permanent tibial insert rotating platform, size 3, 15- mm thickness.  I reduced the knee and we had excellent function.  We water picked the knee out again.  I inserted a Hemovac drain and closed the wound in layers in usual fashion.  At the end of the case, we injected a mixture of 20 mL of Exparel with 20 mL of normal saline.  The patient also had 1 g of tranexamic acid in the usual fashion at the beginning of the case with 2 g of IV Ancef.  As I mentioned, the wound was closed  in usual fashion with subcuticular closure.  Sterile dressings were applied.  The patient left the operating room in satisfactory condition.          ______________________________ Georges Lynch Darrelyn Hillock, M.D.     RAG/MEDQ  D:  11/19/2017  T:  11/19/2017  Job:  [id number]

## 2017-11-19 NOTE — Interval H&P Note (Signed)
History and Physical Interval Note:  11/19/2017 1:13 PM  Jenna Smith  has presented today for surgery, with the diagnosis of Right knee osteoarthritis  The various methods of treatment have been discussed with the patient and family. After consideration of risks, benefits and other options for treatment, the patient has consented to  Procedure(s): RIGHT TOTAL KNEE ARTHROPLASTY (Right) as a surgical intervention .  The patient's history has been reviewed, patient examined, no change in status, stable for surgery.  I have reviewed the patient's chart and labs.  Questions were answered to the patient's satisfaction.     [name]

## 2017-11-19 NOTE — Progress Notes (Signed)
Assisted Dr. Rose with right, ultrasound guided, adductor canal block. Side rails up, monitors on throughout procedure. See vital signs in flow sheet. Tolerated Procedure well.  

## 2017-11-19 NOTE — Transfer of Care (Signed)
Immediate Anesthesia Transfer of Care Note  Patient: Jenna Smith  Procedure(s) Performed: Procedure(s): RIGHT TOTAL KNEE ARTHROPLASTY (Right)  Patient Location: PACU  Anesthesia Type:Regional  Level of Consciousness:  sedated, patient cooperative and responds to stimulation  Airway & Oxygen Therapy:Patient Spontanous Breathing and Patient connected to face mask oxgen  Post-op Assessment:  Report given to PACU RN and Post -op Vital signs reviewed and stable  Post vital signs:  Reviewed and stable  Last Vitals:  Vitals:   11/19/17 1256 11/19/17 1258  BP:  102/73  Pulse: 66   Resp: 17   Temp:    SpO2: 387%     Complications: No apparent anesthesia complications

## 2017-11-19 NOTE — Anesthesia Preprocedure Evaluation (Signed)
Anesthesia Evaluation  Patient identified by MRN, date of birth, ID band Patient awake    Reviewed: Allergy & Precautions, NPO status , Patient's Chart, lab work & pertinent test results  Airway Mallampati: II  TM Distance: >3 FB Neck ROM: Full    Dental no notable dental hx.    Pulmonary neg pulmonary ROS, former smoker,    Pulmonary exam normal breath sounds clear to auscultation       Cardiovascular negative cardio ROS Normal cardiovascular exam Rhythm:Regular Rate:Normal     Neuro/Psych negative neurological ROS  negative psych ROS   GI/Hepatic negative GI ROS, Neg liver ROS,   Endo/Other  negative endocrine ROS  Renal/GU negative Renal ROS  negative genitourinary   Musculoskeletal  (+) Arthritis , Osteoarthritis,    Abdominal   Peds negative pediatric ROS (+)  Hematology negative hematology ROS (+)   Anesthesia Other Findings   Reproductive/Obstetrics negative OB ROS                             Anesthesia Physical Anesthesia Plan  ASA: II  Anesthesia Plan: General   Post-op Pain Management:    Induction: Intravenous  PONV Risk Score and Plan: 3 and Ondansetron, Dexamethasone and Treatment may vary due to age or medical condition  Airway Management Planned: LMA and Oral ETT  Additional Equipment:   Intra-op Plan:   Post-operative Plan: Extubation in OR  Informed Consent: I have reviewed the patients History and Physical, chart, labs and discussed the procedure including the risks, benefits and alternatives for the proposed anesthesia with the patient or authorized representative who has indicated his/her understanding and acceptance.   Dental advisory given  Plan Discussed with: CRNA and Surgeon  Anesthesia Plan Comments:         Anesthesia Quick Evaluation

## 2017-11-19 NOTE — Anesthesia Procedure Notes (Signed)
Anesthesia Regional Block: Adductor canal block   Pre-Anesthetic Checklist: ,, timeout performed, Correct Patient, Correct Site, Correct Laterality, Correct Procedure, Correct Position, site marked, Risks and benefits discussed,  Surgical consent,  Pre-op evaluation,  At surgeon's request and post-op pain management  Laterality: Right  Prep: chloraprep       Needles:  Injection technique: Single-shot  Needle Type: Echogenic Needle     Needle Length: 9cm      Additional Needles:   Procedures:,,,, ultrasound used (permanent image in chart),,,,  Narrative:  Start time: 11/19/2017 12:30 PM End time: 11/19/2017 12:39 PM Injection made incrementally with aspirations every 5 mL.  Performed by: Personally  Anesthesiologist: Myrtie Soman, MD  Additional Notes: Patient tolerated the procedure well without complications

## 2017-11-19 NOTE — Anesthesia Procedure Notes (Signed)
Spinal  Patient location during procedure: OR Start time: 11/19/2017 1:19 PM End time: 11/19/2017 1:23 PM Reason for block: at surgeon's request Staffing Resident/CRNA: Anne Fu, CRNA Performed: resident/CRNA  Preanesthetic Checklist Completed: patient identified, site marked, surgical consent, pre-op evaluation, timeout performed, IV checked, risks and benefits discussed and monitors and equipment checked Spinal Block Patient position: sitting Prep: DuraPrep Patient monitoring: heart rate, continuous pulse ox and blood pressure Approach: right paramedian Location: L2-3 Injection technique: single-shot Needle Needle type: Pencan  Needle gauge: 24 G Needle length: 9 cm Assessment Sensory level: T6 Additional Notes Expiration date of kit checked and confirmed. Patient tolerated procedure well, without complications. X 1 attempt with noted clear CSF return. Loss of motor and sensory on exam post injection.

## 2017-11-20 LAB — BASIC METABOLIC PANEL
Anion gap: 7 (ref 5–15)
BUN: 18 mg/dL (ref 6–20)
CALCIUM: 8.8 mg/dL — AB (ref 8.9–10.3)
CO2: 26 mmol/L (ref 22–32)
CREATININE: 0.67 mg/dL (ref 0.44–1.00)
Chloride: 102 mmol/L (ref 101–111)
GFR calc non Af Amer: >60 mL/min (ref >60)
Glucose, Bld: 133 mg/dL — ABNORMAL HIGH (ref 65–99)
Potassium: 4.2 mmol/L (ref 3.5–5.1)
SODIUM: 135 mmol/L (ref 135–145)

## 2017-11-20 LAB — CBC
HCT: 37.3 % (ref 36.0–46.0)
Hemoglobin: 12.3 g/dL (ref 12.0–15.0)
MCH: 28.9 pg (ref 26.0–34.0)
MCHC: 33 g/dL (ref 30.0–36.0)
MCV: 87.6 fL (ref 78.0–100.0)
Platelets: 213 10*3/uL (ref 150–400)
RBC: 4.26 MIL/uL (ref 3.87–5.11)
RDW: 13.5 % (ref 11.5–15.5)
WBC: 15.1 10*3/uL — ABNORMAL HIGH (ref 4.0–10.5)

## 2017-11-20 MED ORDER — HYDROCODONE-ACETAMINOPHEN 5-325 MG PO TABS: 1.0000 | ORAL_TABLET | ORAL | 0 refills | Status: DC | PRN

## 2017-11-20 MED ORDER — METHOCARBAMOL 500 MG PO TABS: 500.0000 mg | ORAL_TABLET | Freq: Four times a day (QID) | ORAL | 1 refills | Status: DC | PRN

## 2017-11-20 MED ORDER — ASPIRIN EC 325 MG PO TBEC: 325.0000 mg | DELAYED_RELEASE_TABLET | Freq: Two times a day (BID) | ORAL | 0 refills | Status: DC

## 2017-11-20 NOTE — Discharge Instructions (Signed)

## 2017-11-20 NOTE — Evaluation (Signed)
Occupational Therapy Evaluation Patient Details Name: Jenna Smith MRN: [id number] DOB: 29-Jun-1951 Today's Date: 11/20/2017    History of Present Illness RIGHT TOTAL KNEE ARTHROPLASTY (Right)   Clinical Impression   Pt is s/p TKA resulting in the deficits listed below (see OT Problem List).  Pt will benefit from skilled OT to increase their safety and independence with ADL and functional mobility for ADL to facilitate discharge to venue listed below.        Follow Up Recommendations  No OT follow up    Equipment Recommendations  3 in 1 bedside commode    Recommendations for Other Services       Precautions / Restrictions Precautions Precautions: Knee Restrictions Weight Bearing Restrictions: No      Mobility Bed Mobility Overal bed mobility: Needs Assistance Bed Mobility: Supine to Sit     Supine to sit: Mod assist     General bed mobility comments: A with RLE  Transfers Overall transfer level: Needs assistance Equipment used: Rolling walker (2 wheeled) Transfers: Sit to/from [employer] Sit to Stand: Min assist Stand pivot transfers: Min assist       General transfer comment: VC for hand placement        ADL either performed or assessed with clinical judgement   ADL Overall ADL's : Needs assistance/impaired Eating/Feeding: Set up;Sitting   Grooming: Set up;Sitting   Upper Body Bathing: Set up;Sitting   Lower Body Bathing: Moderate assistance;Sit to/from stand;Cueing for safety;Cueing for sequencing   Upper Body Dressing : Set up;Sitting   Lower Body Dressing: Moderate assistance;Sit to/from stand;Cueing for safety   Toilet Transfer: Minimal assistance;Comfort height toilet;BSC;RW   Toileting- Clothing Manipulation and Hygiene: Minimal assistance;Sit to/from stand;Cueing for safety;Cueing for sequencing               Vision Patient Visual Report: No change from baseline              Pertinent Vitals/Pain  Pain Assessment: 0-10 Pain Score: 5  Pain Intervention(s): Limited activity within patient's tolerance;Repositioned;RN gave pain meds during session;Relaxation     Hand Dominance     Extremity/Trunk Assessment Upper Extremity Assessment Upper Extremity Assessment: Generalized weakness           Communication Communication Communication: No difficulties   Cognition Arousal/Alertness: Awake/alert Behavior During Therapy: WFL for tasks assessed/performed Overall Cognitive Status: Within Functional Limits for tasks assessed                                                Home Living Family/patient expects to be discharged to:: Private residence Living Arrangements: Spouse/significant other Available Help at Discharge: Family Type of Home: House Home Access: Stairs to enter Secretary/administrator of Steps: 4   Home Layout: One level     Bathroom Shower/Tub: Producer, television/film/video: Standard     Home Equipment: None          Prior Functioning/Environment Level of Independence: Independent                 OT Problem List: Decreased strength;Decreased safety awareness;Decreased knowledge of precautions;Decreased knowledge of use of DME or AE      OT Treatment/Interventions: Self-care/ADL training;Patient/family education;DME and/or AE instruction    OT Goals(Current goals can be found in the care plan section) Acute Rehab OT Goals Patient Stated Goal: home  with less pain  OT Goal Formulation: With patient Time For Goal Achievement: 11/27/17 Potential to Achieve Goals: Good  OT Frequency: Min 2X/week   Barriers to D/C:               AM-PAC PT "6 Clicks" Daily Activity     Outcome Measure Help from another person eating meals?: None Help from another person taking care of personal grooming?: A Little Help from another person toileting, which includes using toliet, bedpan, or urinal?: A Little Help from another person  bathing (including washing, rinsing, drying)?: A Little Help from another person to put on and taking off regular upper body clothing?: None Help from another person to put on and taking off regular lower body clothing?: A Little 6 Click Score: 20   End of Session Nurse Communication: Mobility status  Activity Tolerance: Patient tolerated treatment well Patient left: in chair                   Time: 0920-0950 OT Time Calculation (min): 30 min Charges:  OT General Charges $OT Visit: 1 Visit OT Evaluation $OT Eval Low Complexity: 1 Low OT Treatments $Self Care/Home Management : 8-22 mins G-Codes:     Lise Auer, OT [phone]  Einar Crow D 11/20/2017, 10:45 AM

## 2017-11-20 NOTE — Progress Notes (Signed)
Subjective: 1 Day Post-Op Procedure(s) (LRB): RIGHT TOTAL KNEE ARTHROPLASTY (Right) Patient reports pain as 2 on 0-10 scale.Hemovac DCd and wound looks fine.    Objective: Vital signs in last 24 hours: Temp:  [97.3 F (36.3 C)-98.5 F (36.9 C)] 98.5 F (36.9 C) (01/24 0524) Pulse Rate:  [62-78] 77 (01/24 0524) Resp:  [8-22] 16 (01/24 0524) BP: (89-124)/(48-85) 116/67 (01/24 0524) SpO2:  [96 %-100 %] 96 % (01/24 0524) Weight:  [80.5 kg (177 lb 6 oz)] 80.5 kg (177 lb 6 oz) (01/23 1054)  Intake/Output from previous day: 01/23 0701 - 01/24 0700 In: 3380 [P.O.:280; I.V.:3050; IV Piggyback:50] Out: 2825 [Urine:2250; Drains:525; Blood:50] Intake/Output this shift: No intake/output data recorded.  Recent Labs    11/20/17 0519  HGB 12.3   Recent Labs    11/20/17 0519  WBC 15.1*  RBC 4.26  HCT 37.3  PLT 213   Recent Labs    11/20/17 0519  NA 135  K 4.2  CL 102  CO2 26  BUN 18  CREATININE 0.67  GLUCOSE 133*  CALCIUM 8.8*   No results for input(s): LABPT, INR in the last 72 hours.  Dorsiflexion/Plantar flexion intact No cellulitis present  Assessment/Plan: 1 Day Post-Op Procedure(s) (LRB): RIGHT TOTAL KNEE ARTHROPLASTY (Right) Up with therapy  [name] 11/20/2017, 7:25 AM

## 2017-11-20 NOTE — Progress Notes (Signed)
Discharge planning, spoke with patient at bedside. Have chosen [facility] for HH PT, evaluate and treat. Contacted [facility] for referral. Needs RW and 3n1, contacted [facility] to deliver to room. [phone] 

## 2017-11-20 NOTE — Evaluation (Signed)
Physical Therapy Evaluation Patient Details Name: Jenna Smith MRN: [id number] DOB: Mar 16, 1951 Today's Date: 11/20/2017   History of Present Illness  RIGHT TOTAL KNEE ARTHROPLASTY (Right)  Clinical Impression  Pt s/p R TKR and presents with decreased R LE strength/ROM and post op pain limiting functional mobility.  Pt should progress to dc home with family assist.    Follow Up Recommendations Home health PT;DC plan and follow up therapy as arranged by surgeon    Equipment Recommendations  Rolling walker with 5" wheels    Recommendations for Other Services OT consult     Precautions / Restrictions Precautions Precautions: Knee Restrictions Weight Bearing Restrictions: No Other Position/Activity Restrictions: WBAT      Mobility  Bed Mobility Overal bed mobility: Needs Assistance Bed Mobility: Sit to Supine     Supine to sit: Mod assist Sit to supine: Min assist   General bed mobility comments: cues for sequence and use of L LE to self assist  Transfers Overall transfer level: Needs assistance Equipment used: Rolling walker (2 wheeled) Transfers: Sit to/from Stand Sit to Stand: Min assist Stand pivot transfers: Min assist       General transfer comment: cues for LE management and use of UEs to self assist  Ambulation/Gait Ambulation/Gait assistance: Min assist Ambulation Distance (Feet): 38 Feet Assistive device: Rolling walker (2 wheeled) Gait Pattern/deviations: Step-to pattern;Decreased step length - right;Decreased step length - left;Shuffle;Trunk flexed Gait velocity: decr Gait velocity interpretation: Below normal speed for age/gender General Gait Details: cues for sequence, posture and position from [employer]            Wheelchair Mobility    Modified Rankin (Stroke Patients Only)       Balance                                             Pertinent Vitals/Pain Pain Assessment: 0-10 Pain Score: 8  Pain  Location: R knee Pain Descriptors / Indicators: Aching;Sore Pain Intervention(s): Limited activity within patient's tolerance;Monitored during session;Premedicated before session;Ice applied    Home Living Family/patient expects to be discharged to:: Private residence Living Arrangements: Spouse/significant other Available Help at Discharge: Family Type of Home: House Home Access: Stairs to enter Entrance Stairs-Rails: Doctor, general practice of Steps: 4 Home Layout: One level Home Equipment: None      Prior Function Level of Independence: Independent               Hand Dominance        Extremity/Trunk Assessment   Upper Extremity Assessment Upper Extremity Assessment: Overall WFL for tasks assessed    Lower Extremity Assessment Lower Extremity Assessment: RLE deficits/detail RLE Deficits / Details: 2/5 quads with AAROM at R knee -10 - 25 pain limited    Cervical / Trunk Assessment Cervical / Trunk Assessment: Normal  Communication   Communication: No difficulties  Cognition Arousal/Alertness: Awake/alert Behavior During Therapy: WFL for tasks assessed/performed Overall Cognitive Status: Within Functional Limits for tasks assessed                                        General Comments      Exercises Total Joint Exercises Ankle Circles/Pumps: AROM;Both;15 reps;Supine Quad Sets: AROM;Both;10 reps;Supine Heel Slides: AAROM;Right;10 reps;Supine Straight Leg Raises: AAROM;Right;10 reps;Supine  Assessment/Plan    PT Assessment Patient needs continued PT services  PT Problem List Decreased strength;Decreased range of motion;Decreased activity tolerance;Decreased mobility;Decreased knowledge of use of DME;Pain       PT Treatment Interventions DME instruction;Gait training;Stair training;Functional mobility training;Therapeutic activities;Therapeutic exercise;Patient/family education    PT Goals (Current goals can be found in the  Care Plan section)  Acute Rehab PT Goals Patient Stated Goal: home with less pain  PT Goal Formulation: With patient Time For Goal Achievement: 11/27/17 Potential to Achieve Goals: Good    Frequency 7X/week   Barriers to discharge        Co-evaluation               AM-PAC PT "6 Clicks" Daily Activity  Outcome Measure Difficulty turning over in bed (including adjusting bedclothes, sheets and blankets)?: Unable Difficulty moving from lying on back to sitting on the side of the bed? : Unable Difficulty sitting down on and standing up from a chair with arms (e.g., wheelchair, bedside commode, etc,.)?: Unable Help needed moving to and from a bed to chair (including a wheelchair)?: A Little Help needed walking in hospital room?: A Little Help needed climbing 3-5 steps with a railing? : A Lot 6 Click Score: 11    End of Session Equipment Utilized During Treatment: Gait belt;Right knee immobilizer Activity Tolerance: Patient limited by fatigue;Patient limited by pain Patient left: in bed;with call bell/phone within reach;with family/visitor present Nurse Communication: Mobility status PT Visit Diagnosis: Difficulty in walking, not elsewhere classified (R26.2)    Time: 6962-9528 PT Time Calculation (min) (ACUTE ONLY): 28 min   Charges:   PT Evaluation $PT Eval Low Complexity: 1 Low PT Treatments $Therapeutic Exercise: 8-22 mins   PT G Codes:        Pg [phone]   Jago Carton 11/20/2017, 12:53 PM

## 2017-11-21 LAB — CBC
HCT: 35 % — ABNORMAL LOW (ref 36.0–46.0)
HEMOGLOBIN: 12 g/dL (ref 12.0–15.0)
MCH: 29.3 pg (ref 26.0–34.0)
MCHC: 34.3 g/dL (ref 30.0–36.0)
MCV: 85.6 fL (ref 78.0–100.0)
Platelets: 222 10*3/uL (ref 150–400)
RBC: 4.09 MIL/uL (ref 3.87–5.11)
RDW: 13.6 % (ref 11.5–15.5)
WBC: 15.6 10*3/uL — AB (ref 4.0–10.5)

## 2017-11-21 LAB — BASIC METABOLIC PANEL
ANION GAP: 10 (ref 5–15)
BUN: 13 mg/dL (ref 6–20)
CHLORIDE: 97 mmol/L — AB (ref 101–111)
CO2: 25 mmol/L (ref 22–32)
Calcium: 8.8 mg/dL — ABNORMAL LOW (ref 8.9–10.3)
Creatinine, Ser: 0.69 mg/dL (ref 0.44–1.00)
GFR calc Af Amer: >60 mL/min (ref >60)
Glucose, Bld: 126 mg/dL — ABNORMAL HIGH (ref 65–99)
POTASSIUM: 3.8 mmol/L (ref 3.5–5.1)
SODIUM: 132 mmol/L — AB (ref 135–145)

## 2017-11-21 NOTE — Progress Notes (Signed)
RN reviewed discharge instructions with patient and family. All questions answered.   Paperwork and prescriptions given to patient.   NT rolled patient down with all belongings to family car.  

## 2017-11-21 NOTE — Addendum Note (Signed)
Addendum  created 11/21/17 0758 by Myrtie Soman, MD   Child order released for a procedure order, Intraprocedure Blocks edited, Sign clinical note

## 2017-11-21 NOTE — Progress Notes (Signed)
Subjective: 2 Days Post-Op Procedure(s) (LRB): RIGHT TOTAL KNEE ARTHROPLASTY (Right) Patient reports pain as 2 on 0-10 scale.    Objective: Vital signs in last 24 hours: Temp:  [97.8 F (36.6 C)-98.9 F (37.2 C)] 98.9 F (37.2 C) (01/25 0527) Pulse Rate:  [68-85] 85 (01/25 0527) Resp:  [16-18] 16 (01/25 0527) BP: (110-136)/(54-64) 136/64 (01/25 0527) SpO2:  [95 %-100 %] 97 % (01/25 0527)  Intake/Output from previous day: 01/24 0701 - 01/25 0700 In: 820 [P.O.:820] Out: 1100 [Urine:1100] Intake/Output this shift: No intake/output data recorded.  Recent Labs    11/20/17 0519 11/21/17 0524  HGB 12.3 12.0   Recent Labs    11/20/17 0519 11/21/17 0524  WBC 15.1* 15.6*  RBC 4.26 4.09  HCT 37.3 35.0*  PLT 213 222   Recent Labs    11/20/17 0519 11/21/17 0524  NA 135 132*  K 4.2 3.8  CL 102 97*  CO2 26 25  BUN 18 13  CREATININE 0.67 0.69  GLUCOSE 133* 126*  CALCIUM 8.8* 8.8*   No results for input(s): LABPT, INR in the last 72 hours.  Dorsiflexion/Plantar flexion intact No cellulitis present Compartment soft  Assessment/Plan: 2 Days Post-Op Procedure(s) (LRB): RIGHT TOTAL KNEE ARTHROPLASTY (Right) Up with therapy.Discharge Today  [name] 11/21/2017, 7:21 AM

## 2017-11-21 NOTE — Anesthesia Procedure Notes (Signed)
Anesthesia Procedure Image    

## 2017-11-21 NOTE — Progress Notes (Signed)
Physical Therapy Treatment Patient Details Name: Jenna Smith MRN: [id number] DOB: 01-27-1951 Today's Date: 11/21/2017    History of Present Illness RIGHT TOTAL KNEE ARTHROPLASTY (Right)    PT Comments    Marked improvement in activity tolerance vs yesterday.  Family present to review don/doff KI, stairs and home therex with written instruction provided.   Follow Up Recommendations  Home health PT;DC plan and follow up therapy as arranged by surgeon     Equipment Recommendations  Rolling walker with 5" wheels    Recommendations for Other Services OT consult     Precautions / Restrictions Precautions Precautions: Knee Restrictions Weight Bearing Restrictions: No Other Position/Activity Restrictions: WBAT    Mobility  Bed Mobility               General bed mobility comments: Pt up in chair and requests back to same  Transfers Overall transfer level: Needs assistance Equipment used: Rolling walker (2 wheeled) Transfers: Sit to/from Stand Sit to Stand: Supervision;Min guard Stand pivot transfers: Supervision;Min guard       General transfer comment: cues for LE management and use of UEs to self assist  Ambulation/Gait Ambulation/Gait assistance: Min assist;Min guard Ambulation Distance (Feet): 60 Feet Assistive device: Rolling walker (2 wheeled) Gait Pattern/deviations: Step-to pattern;Decreased step length - right;Decreased step length - left;Shuffle;Trunk flexed Gait velocity: decr Gait velocity interpretation: Below normal speed for age/gender General Gait Details: cues for sequence, posture and position from RW   Stairs Stairs: Yes   Stair Management: One rail Left;Step to pattern;Forwards;With crutches Number of Stairs: 5 General stair comments: cues for sequence and foot/crutch placement  Wheelchair Mobility    Modified Rankin (Stroke Patients Only)       Balance                                             Cognition Arousal/Alertness: Awake/alert Behavior During Therapy: WFL for tasks assessed/performed Overall Cognitive Status: Within Functional Limits for tasks assessed                                        Exercises Total Joint Exercises Ankle Circles/Pumps: AROM;Both;15 reps;Supine Quad Sets: AROM;Both;10 reps;Supine Heel Slides: AAROM;Right;Supine;20 reps Straight Leg Raises: AAROM;Right;Supine;20 reps Goniometric ROM: AAROM R knee -10- 40    General Comments        Pertinent Vitals/Pain Pain Assessment: 0-10 Pain Score: 5  Pain Location: R knee Pain Descriptors / Indicators: Aching;Sore Pain Intervention(s): Premedicated before session;Monitored during session;Limited activity within patient's tolerance;Ice applied    Home Living                      Prior Function            PT Goals (current goals can now be found in the care plan section) Acute Rehab PT Goals Patient Stated Goal: home with less pain  PT Goal Formulation: With patient Time For Goal Achievement: 11/27/17 Potential to Achieve Goals: Good Progress towards PT goals: Progressing toward goals    Frequency    7X/week      PT Plan Current plan remains appropriate    Co-evaluation              AM-PAC PT "6 Clicks" Daily Activity  Outcome Measure  Difficulty turning over in bed (including adjusting bedclothes, sheets and blankets)?: Unable Difficulty moving from lying on back to sitting on the side of the bed? : Unable Difficulty sitting down on and standing up from a chair with arms (e.g., wheelchair, bedside commode, etc,.)?: Unable Help needed moving to and from a bed to chair (including a wheelchair)?: A Little Help needed walking in hospital room?: A Little Help needed climbing 3-5 steps with a railing? : A Little 6 Click Score: 12    End of Session Equipment Utilized During Treatment: Gait belt;Right knee immobilizer Activity Tolerance: Patient  limited by fatigue;Patient tolerated treatment well Patient left: in chair;with call bell/phone within reach;with family/visitor present Nurse Communication: Mobility status PT Visit Diagnosis: Difficulty in walking, not elsewhere classified (R26.2)     Time: 2703-5009 PT Time Calculation (min) (ACUTE ONLY): 44 min  Charges:  $Gait Training: 8-22 mins $Therapeutic Exercise: 8-22 mins $Therapeutic Activity: 8-22 mins                    G Codes:       Pg [phone]    Jennessy Sandridge 11/21/2017, 12:49 PM

## 2017-11-21 NOTE — Progress Notes (Signed)
Occupational Therapy Treatment Patient Details Name: Jenna Smith MRN: [id number] DOB: 10/21/1951 Today's Date: 11/21/2017    History of present illness RIGHT TOTAL KNEE ARTHROPLASTY (Right)   OT comments  Daughter will A as needed  Follow Up Recommendations  No OT follow up    Equipment Recommendations  3 in 1 bedside commode    Recommendations for Other Services      Precautions / Restrictions Precautions Precautions: Knee Restrictions Weight Bearing Restrictions: No Other Position/Activity Restrictions: WBAT       Mobility Bed Mobility               General bed mobility comments: pt in chair  Transfers Overall transfer level: Needs assistance Equipment used: Rolling walker (2 wheeled) Transfers: Sit to/from Stand;Stand Pivot Transfers Sit to Stand: Supervision;Min guard Stand pivot transfers: Supervision;Min guard                ADL either performed or assessed with clinical judgement   ADL Overall ADL's : Needs assistance/impaired                     Lower Body Dressing: Minimal assistance;Sit to/from stand;Cueing for safety;Cueing for sequencing;Cueing for compensatory techniques Lower Body Dressing Details (indicate cue type and reason): A with shoe Toilet Transfer: Supervision/safety;RW;Comfort height toilet   Toileting- Clothing Manipulation and Hygiene: Supervision/safety;Sit to/from stand   Tub/ Engineer, structural: Education officer, environmental Details (indicate cue type and reason): verbalized safety   General ADL Comments: daugther will A as needed     Vision Patient Visual Report: No change from baseline     Perception     Praxis      Cognition Arousal/Alertness: Awake/alert Behavior During Therapy: WFL for tasks assessed/performed Overall Cognitive Status: Within Functional Limits for tasks assessed                                                     Pertinent Vitals/ Pain        Pain Score: 4  Pain Descriptors / Indicators: Aching;Sore Pain Intervention(s): Limited activity within patient's tolerance     Prior Functioning/Environment              Frequency  Min 2X/week        Progress Toward Goals  OT Goals(current goals can now be found in the care plan section)  Progress towards OT goals: Progressing toward goals     Plan Discharge plan remains appropriate    Co-evaluation                 AM-PAC PT "6 Clicks" Daily Activity     Outcome Measure   Help from another person eating meals?: None Help from another person taking care of personal grooming?: A Little Help from another person toileting, which includes using toliet, bedpan, or urinal?: A Little Help from another person bathing (including washing, rinsing, drying)?: A Little Help from another person to put on and taking off regular upper body clothing?: None Help from another person to put on and taking off regular lower body clothing?: A Little 6 Click Score: 20    End of Session        Activity Tolerance Patient tolerated treatment well   Patient Left in chair   Nurse Communication Mobility status  Charges: OT General Charges $OT Visit: 1 Visit OT Treatments $Self Care/Home Management : 8-22 mins  [place], [place] [phone]   Alba Cory 11/21/2017, 11:28 AM

## 2017-11-24 NOTE — Discharge Summary (Signed)
Physician Discharge Summary   Patient ID: Jenna Smith MRN: [id number] DOB/AGE: 01-Aug-1951 67 y.o.  Admit date: 11/19/2017 Discharge date: 11/21/2017  Primary Diagnosis: Primary osteoarthritis right knee  Admission Diagnoses:  Past Medical History:  Diagnosis Date  . Allergy    Discharge Diagnoses:   Active Problems:   Hx of total knee arthroplasty, right  Estimated body mass index is 28.63 kg/m as calculated from the following:   Height as of this encounter: 5\' 6"  (1.676 m).   Weight as of this encounter: 80.5 kg (177 lb 6 oz).  Procedure:  Procedure(s) (LRB): RIGHT TOTAL KNEE ARTHROPLASTY (Right)   Consults: None  HPI: Jenna Smith, 67 y.o. female, has a history of pain and functional disability in the right knee due to arthritis and has failed non-surgical conservative treatments for greater than 12 weeks to includeNSAID's and/or analgesics, corticosteriod injections, flexibility and strengthening excercises and activity modification.  Onset of symptoms was gradual, starting 3 years ago with gradually worsening course since that time. The patient noted prior procedures on the knee to include  arthroscopy and menisectomy on the right knee(s).  Patient currently rates pain in the right knee(s) at 8 out of 10 with activity. Patient has night pain, worsening of pain with activity and weight bearing, pain that interferes with activities of daily living, pain with passive range of motion, crepitus and joint swelling.  Patient has evidence of periarticular osteophytes and joint space narrowing by imaging studies. There is no active infection.   Laboratory Data: Admission on 11/19/2017, Discharged on 11/21/2017  Component Date Value Ref Range Status  . WBC 11/20/2017 15.1* 4.0 - 10.5 K/uL Final  . RBC 11/20/2017 4.26  3.87 - 5.11 MIL/uL Final  . Hemoglobin 11/20/2017 12.3  12.0 - 15.0 g/dL Final  . HCT 95/62/1308 37.3  36.0 - 46.0 % Final  . MCV 11/20/2017 87.6  78.0  - 100.0 fL Final  . MCH 11/20/2017 28.9  26.0 - 34.0 pg Final  . MCHC 11/20/2017 33.0  30.0 - 36.0 g/dL Final  . RDW 65/78/4696 13.5  11.5 - 15.5 % Final  . Platelets 11/20/2017 213  150 - 400 K/uL Final  . Sodium 11/20/2017 135  135 - 145 mmol/L Final  . Potassium 11/20/2017 4.2  3.5 - 5.1 mmol/L Final  . Chloride 11/20/2017 102  101 - 111 mmol/L Final  . CO2 11/20/2017 26  22 - 32 mmol/L Final  . Glucose, Bld 11/20/2017 133* 65 - 99 mg/dL Final  . BUN 29/52/8413 18  6 - 20 mg/dL Final  . Creatinine, Ser 11/20/2017 0.67  0.44 - 1.00 mg/dL Final  . Calcium 24/40/1027 8.8* 8.9 - 10.3 mg/dL Final  . GFR calc non Af Amer 11/20/2017 >60  >60 mL/min Final  . GFR calc Af Amer 11/20/2017 >60  >60 mL/min Final   Comment: (NOTE) The eGFR has been calculated using the CKD EPI equation. This calculation has not been validated in all clinical situations. eGFR's persistently <60 mL/min signify possible Chronic Kidney Disease.   . Anion gap 11/20/2017 7  5 - 15 Final  . WBC 11/21/2017 15.6* 4.0 - 10.5 K/uL Final  . RBC 11/21/2017 4.09  3.87 - 5.11 MIL/uL Final  . Hemoglobin 11/21/2017 12.0  12.0 - 15.0 g/dL Final  . HCT 25/36/6440 35.0* 36.0 - 46.0 % Final  . MCV 11/21/2017 85.6  78.0 - 100.0 fL Final  . MCH 11/21/2017 29.3  26.0 - 34.0 pg Final  . MCHC 11/21/2017  34.3  30.0 - 36.0 g/dL Final  . RDW 46/96/2952 13.6  11.5 - 15.5 % Final  . Platelets 11/21/2017 222  150 - 400 K/uL Final  . Sodium 11/21/2017 132* 135 - 145 mmol/L Final  . Potassium 11/21/2017 3.8  3.5 - 5.1 mmol/L Final  . Chloride 11/21/2017 97* 101 - 111 mmol/L Final  . CO2 11/21/2017 25  22 - 32 mmol/L Final  . Glucose, Bld 11/21/2017 126* 65 - 99 mg/dL Final  . BUN 84/13/2440 13  6 - 20 mg/dL Final  . Creatinine, Ser 11/21/2017 0.69  0.44 - 1.00 mg/dL Final  . Calcium 08/24/2535 8.8* 8.9 - 10.3 mg/dL Final  . GFR calc non Af Amer 11/21/2017 >60  >60 mL/min Final  . GFR calc Af Amer 11/21/2017 >60  >60 mL/min Final    Comment: (NOTE) The eGFR has been calculated using the CKD EPI equation. This calculation has not been validated in all clinical situations. eGFR's persistently <60 mL/min signify possible Chronic Kidney Disease.   Eustaquio Boyden gap 11/21/2017 10  5 - 15 Final  Hospital Outpatient Visit on 11/12/2017  Component Date Value Ref Range Status  . aPTT 11/12/2017 26  24 - 36 seconds Final  . WBC 11/12/2017 8.0  4.0 - 10.5 K/uL Final  . RBC 11/12/2017 5.03  3.87 - 5.11 MIL/uL Final  . Hemoglobin 11/12/2017 14.9  12.0 - 15.0 g/dL Final  . HCT 64/40/3474 44.3  36.0 - 46.0 % Final  . MCV 11/12/2017 88.1  78.0 - 100.0 fL Final  . MCH 11/12/2017 29.6  26.0 - 34.0 pg Final  . MCHC 11/12/2017 33.6  30.0 - 36.0 g/dL Final  . RDW 25/95/6387 13.5  11.5 - 15.5 % Final  . Platelets 11/12/2017 268  150 - 400 K/uL Final  . Neutrophils Relative % 11/12/2017 64  % Final  . Neutro Abs 11/12/2017 5.2  1.7 - 7.7 K/uL Final  . Lymphocytes Relative 11/12/2017 28  % Final  . Lymphs Abs 11/12/2017 2.2  0.7 - 4.0 K/uL Final  . Monocytes Relative 11/12/2017 6  % Final  . Monocytes Absolute 11/12/2017 0.5  0.1 - 1.0 K/uL Final  . Eosinophils Relative 11/12/2017 2  % Final  . Eosinophils Absolute 11/12/2017 0.1  0.0 - 0.7 K/uL Final  . Basophils Relative 11/12/2017 0  % Final  . Basophils Absolute 11/12/2017 0.0  0.0 - 0.1 K/uL Final  . Sodium 11/12/2017 138  135 - 145 mmol/L Final  . Potassium 11/12/2017 4.2  3.5 - 5.1 mmol/L Final  . Chloride 11/12/2017 104  101 - 111 mmol/L Final  . CO2 11/12/2017 28  22 - 32 mmol/L Final  . Glucose, Bld 11/12/2017 98  65 - 99 mg/dL Final  . BUN 56/43/3295 19  6 - 20 mg/dL Final  . Creatinine, Ser 11/12/2017 0.77  0.44 - 1.00 mg/dL Final  . Calcium 18/84/1660 9.0  8.9 - 10.3 mg/dL Final  . Total Protein 11/12/2017 6.8  6.5 - 8.1 g/dL Final  . Albumin 63/10/6008 3.7  3.5 - 5.0 g/dL Final  . AST 93/23/5573 17  15 - 41 U/L Final  . ALT 11/12/2017 16  14 - 54 U/L Final  . Alkaline  Phosphatase 11/12/2017 93  38 - 126 U/L Final  . Total Bilirubin 11/12/2017 0.6  0.3 - 1.2 mg/dL Final  . GFR calc non Af Amer 11/12/2017 >60  >60 mL/min Final  . GFR calc Af Amer 11/12/2017 >60  >60 mL/min Final  Comment: (NOTE) The eGFR has been calculated using the CKD EPI equation. This calculation has not been validated in all clinical situations. eGFR's persistently <60 mL/min signify possible Chronic Kidney Disease.   . Anion gap 11/12/2017 6  5 - 15 Final  . Prothrombin Time 11/12/2017 12.8  11.4 - 15.2 seconds Final  . INR 11/12/2017 0.97   Final  . ABO/RH(D) 11/12/2017 A POS   Final  . Antibody Screen 11/12/2017 NEG   Final  . Sample Expiration 11/12/2017 11/22/2017   Final  . Extend sample reason 11/12/2017 NO TRANSFUSIONS OR PREGNANCY IN THE PAST 3 MONTHS   Final  . Color, Urine 11/12/2017 YELLOW  YELLOW Final  . APPearance 11/12/2017 CLEAR  CLEAR Final  . Specific Gravity, Urine 11/12/2017 1.011  1.005 - 1.030 Final  . pH 11/12/2017 6.0  5.0 - 8.0 Final  . Glucose, UA 11/12/2017 NEGATIVE  NEGATIVE mg/dL Final  . Hgb urine dipstick 11/12/2017 MODERATE* NEGATIVE Final  . Bilirubin Urine 11/12/2017 NEGATIVE  NEGATIVE Final  . Ketones, ur 11/12/2017 NEGATIVE  NEGATIVE mg/dL Final  . Protein, ur 29/52/8413 NEGATIVE  NEGATIVE mg/dL Final  . Nitrite 24/40/1027 NEGATIVE  NEGATIVE Final  . Leukocytes, UA 11/12/2017 NEGATIVE  NEGATIVE Final  . RBC / HPF 11/12/2017 0-5  0 - 5 RBC/hpf Final  . WBC, UA 11/12/2017 0-5  0 - 5 WBC/hpf Final  . Bacteria, UA 11/12/2017 NONE SEEN  NONE SEEN Final  . Squamous Epithelial / LPF 11/12/2017 0-5* NONE SEEN Final  . Mucus 11/12/2017 PRESENT   Final  . MRSA, PCR 11/12/2017 NEGATIVE  NEGATIVE Final  . Staphylococcus aureus 11/12/2017 NEGATIVE  NEGATIVE Final   Comment: (NOTE) The Xpert SA Assay (FDA approved for NASAL specimens in patients 67 years of age and older), is one component of a comprehensive surveillance program. It is not intended  to diagnose infection nor to guide or monitor treatment.   . ABO/RH(D) 11/12/2017 A POS   Final     X-Rays:Dg Chest 2 View  Result Date: 11/12/2017 CLINICAL DATA:  Preop total knee replacement. EXAM: CHEST  2 VIEW COMPARISON:  None. FINDINGS: The heart size and mediastinal contours are within normal limits. Both lungs are clear. The visualized skeletal structures are unremarkable. IMPRESSION: No active cardiopulmonary disease. Electronically Signed   By: Lupita Raider, M.D.   On: 11/12/2017 16:53    EKG: Orders placed or performed during the hospital encounter of 11/12/17  . EKG  . EKG     Hospital Course: Jenna Smith is a 67 y.o. who was admitted to [facility]. They were brought to the operating room on 11/19/2017 and underwent Procedure(s): RIGHT TOTAL KNEE ARTHROPLASTY.  Patient tolerated the procedure well and was later transferred to the recovery room and then to the orthopaedic floor for postoperative care.  They were given PO and IV analgesics for pain control following their surgery.  They were given 24 hours of postoperative antibiotics of  Anti-infectives (From admission, onward)   Start     Dose/Rate Route Frequency Ordered Stop   11/20/17 0000  vancomycin (VANCOCIN) IVPB 1000 mg/200 mL premix     1,000 mg 200 mL/hr over 60 Minutes Intravenous Once 11/19/17 1838 11/20/17 0037   11/19/17 1745  ceFAZolin (ANCEF) IVPB 1 g/50 mL premix  Status:  Discontinued     1 g 100 mL/hr over 30 Minutes Intravenous Every 6 hours 11/19/17 1733 11/19/17 1838   11/19/17 1020  vancomycin (VANCOCIN) IVPB 1000 mg/200  mL premix     1,000 mg 200 mL/hr over 60 Minutes Intravenous On call to O.R. 11/19/17 1020 11/19/17 1313     and started on DVT prophylaxis in the form of Aspirin.   PT and OT were ordered for total joint protocol.  Discharge planning consulted to help with postop disposition and equipment needs.  Patient had a good night on the evening of surgery.  They started  to get up OOB with therapy on day one. Hemovac drain was pulled without difficulty.  Continued to work with therapy into day two.  Dressing was changed on day two and the incision was clean and dry.  The patient had progressed with therapy and meeting their goals.  Incision was healing well.  Patient was seen in rounds and was ready to go home.   Diet: Cardiac diet Activity:WBAT Follow-up:in 2 weeks Disposition - Home Discharged Condition: stable   Discharge Instructions    Call MD / Call 911   Complete by:  As directed    If you experience chest pain or shortness of breath, CALL 911 and be transported to the hospital emergency room.  If you develope a fever above 101 F, pus (white drainage) or increased drainage or redness at the wound, or calf pain, call your surgeon's office.   Constipation Prevention   Complete by:  As directed    Drink plenty of fluids.  Prune juice may be helpful.  You may use a stool softener, such as Colace (over the counter) 100 mg twice a day.  Use MiraLax (over the counter) for constipation as needed.   Diet - low sodium heart healthy   Complete by:  As directed    Discharge instructions   Complete by:  As directed    INSTRUCTIONS AFTER JOINT REPLACEMENT   Remove items at home which could result in a fall. This includes throw rugs or furniture in walking pathways ICE to the affected joint every three hours while awake for 30 minutes at a time, for at least the first 3-5 days, and then as needed for pain and swelling.  Continue to use ice for pain and swelling. You may notice swelling that will progress down to the foot and ankle.  This is normal after surgery.  Elevate your leg when you are not up walking on it.   Continue to use the breathing machine you got in the hospital (incentive spirometer) which will help keep your temperature down.  It is common for your temperature to cycle up and down following surgery, especially at night when you are not up moving  around and exerting yourself.  The breathing machine keeps your lungs expanded and your temperature down.   DIET:  As you were doing prior to hospitalization, we recommend a well-balanced diet.  DRESSING / WOUND CARE / SHOWERING  You may change your dressing every day with sterile gauze.  Please use good hand washing techniques before changing the dressing.  Do not use any lotions or creams on the incision until instructed by your surgeon.  ACTIVITY  Increase activity slowly as tolerated, but follow the weight bearing instructions below.   No driving for 6 weeks or until further direction given by your physician.  You cannot drive while taking narcotics.  No lifting or carrying greater than 10 lbs. until further directed by your surgeon. Avoid periods of inactivity such as sitting longer than an hour when not asleep. This helps prevent blood clots.  You may return to  work once you are authorized by your doctor.     WEIGHT BEARING   Weight bearing as tolerated with assist device (walker, cane, etc) as directed, use it as long as suggested by your surgeon or therapist, typically at least 4-6 weeks.   EXERCISES  Results after joint replacement surgery are often greatly improved when you follow the exercise, range of motion and muscle strengthening exercises prescribed by your doctor. Safety measures are also important to protect the joint from further injury. Any time any of these exercises cause you to have increased pain or swelling, decrease what you are doing until you are comfortable again and then slowly increase them. If you have problems or questions, call your caregiver or physical therapist for advice.   Rehabilitation is important following a joint replacement. After just a few days of immobilization, the muscles of the leg can become weakened and shrink (atrophy).  These exercises are designed to build up the tone and strength of the thigh and leg muscles and to improve motion.  Often times heat used for twenty to thirty minutes before working out will loosen up your tissues and help with improving the range of motion but do not use heat for the first two weeks following surgery (sometimes heat can increase post-operative swelling).   These exercises can be done on a training (exercise) mat, on the floor, on a table or on a bed. Use whatever works the best and is most comfortable for you.    Use music or television while you are exercising so that the exercises are a pleasant break in your day. This will make your life better with the exercises acting as a break in your routine that you can look forward to.   Perform all exercises about fifteen times, three times per day or as directed.  You should exercise both the operative leg and the other leg as well.  Exercises include:   Quad Sets - Tighten up the muscle on the front of the thigh (Quad) and hold for 5-10 seconds.   Straight Leg Raises - With your knee straight (if you were given a brace, keep it on), lift the leg to 60 degrees, hold for 3 seconds, and slowly lower the leg.  Perform this exercise against resistance later as your leg gets stronger.  Leg Slides: Lying on your back, slowly slide your foot toward your buttocks, bending your knee up off the floor (only go as far as is comfortable). Then slowly slide your foot back down until your leg is flat on the floor again.  Angel Wings: Lying on your back spread your legs to the side as far apart as you can without causing discomfort.  Hamstring Strength:  Lying on your back, push your heel against the floor with your leg straight by tightening up the muscles of your buttocks.  Repeat, but this time bend your knee to a comfortable angle, and push your heel against the floor.  You may put a pillow under the heel to make it more comfortable if necessary.   A rehabilitation program following joint replacement surgery can speed recovery and prevent re-injury in the future due  to weakened muscles. Contact your doctor or a physical therapist for more information on knee rehabilitation.    CONSTIPATION  Constipation is defined medically as fewer than three stools per week and severe constipation as less than one stool per week.  Even if you have a regular bowel pattern at home, your normal regimen  is likely to be disrupted due to multiple reasons following surgery.  Combination of anesthesia, postoperative narcotics, change in appetite and fluid intake all can affect your bowels.   YOU MUST use at least one of the following options; they are listed in order of increasing strength to get the job done.  They are all available over the counter, and you may need to use some, POSSIBLY even all of these options:    Drink plenty of fluids (prune juice may be helpful) and high fiber foods Colace 100 mg by mouth twice a day  Senokot for constipation as directed and as needed Dulcolax (bisacodyl), take with full glass of water  Miralax (polyethylene glycol) once or twice a day as needed.  If you have tried all these things and are unable to have a bowel movement in the first 3-4 days after surgery call either your surgeon or your primary doctor.    If you experience loose stools or diarrhea, hold the medications until you stool forms back up.  If your symptoms do not get better within 1 week or if they get worse, check with your doctor.  If you experience "the worst abdominal pain ever" or develop nausea or vomiting, please contact the office immediately for further recommendations for treatment.   ITCHING:  If you experience itching with your medications, try taking only a single pain pill, or even half a pain pill at a time.  You can also use Benadryl over the counter for itching or also to help with sleep.   TED HOSE STOCKINGS:  Use stockings on both legs until for at least 2 weeks or as directed by physician office. They may be removed at night for sleeping.  MEDICATIONS:   See your medication summary on the "After Visit Summary" that nursing will review with you.  You may have some home medications which will be placed on hold until you complete the course of blood thinner medication.  It is important for you to complete the blood thinner medication as prescribed.  PRECAUTIONS:  If you experience chest pain or shortness of breath - call 911 immediately for transfer to the hospital emergency department.   If you develop a fever greater that 101 F, purulent drainage from wound, increased redness or drainage from wound, foul odor from the wound/dressing, or calf pain - CONTACT YOUR SURGEON.                                                   FOLLOW-UP APPOINTMENTS:  If you do not already have a post-op appointment, please call the office for an appointment to be seen by your surgeon.  Guidelines for how soon to be seen are listed in your "After Visit Summary", but are typically between 1-4 weeks after surgery.   MAKE SURE YOU:  Understand these instructions.  Get help right away if you are not doing well or get worse.    Thank you for letting us be a part of your medical care team.  It is a privilege we respect greatly.  We hope these instructions will help you stay on track for a fast and full recovery!   Increase activity slowly as tolerated   Complete by:  As directed      Allergies as of 11/21/2017      Reactions   Indomethacin  Swelling   Face swells up   Latex Hives   Neosporin [neomycin-bacitracin Zn-polymyx] Hives   Penicillins Hives   Has patient had a PCN reaction causing immediate rash, facial/tongue/throat swelling, SOB or lightheadedness with hypotension: no Has patient had a PCN reaction causing severe rash involving mucus membranes or skin necrosis: no Has patient had a PCN reaction that required hospitalization: no Has patient had a PCN reaction occurring within the last 10 years: no If all of the above answers are "NO", then may proceed with  Cephalosporin use.      Medication List    TAKE these medications   aspirin EC 325 MG tablet Take 1 tablet (325 mg total) by mouth 2 (two) times daily.   cetirizine 10 MG tablet Commonly known as:  ZYRTEC Take 10 mg by mouth daily as needed for allergies.   HYDROcodone-acetaminophen 5-325 MG tablet Commonly known as:  NORCO/VICODIN Take 1-2 tablets by mouth every 4 (four) hours as needed for moderate pain ((score 4 to 6)).   methocarbamol 500 MG tablet Commonly known as:  ROBAXIN Take 1 tablet (500 mg total) by mouth every 6 (six) hours as needed for muscle spasms.      Follow-up Information    Ranee Gosselin, MD. Schedule an appointment as soon as possible for a visit in 2 week(s).   Specialty:  Orthopedic Surgery Contact information: [address] [place] [address] [phone]        [facility] Follow up.   Specialty:  Home Health Services Why:  physical therapy Contact information: [address] [place] [address] [place] [address] [phone]        [facility] - Dme Follow up.   Why:  walker and [address] Contact information: [address] [place] [address] [phone]           Signed: Dimitri Ped, PA-C Orthopaedic Surgery 11/24/2017, 8:42 AM

## 2017-12-08 ENCOUNTER — Ambulatory Visit: Payer: BLUE CROSS/BLUE SHIELD | Attending: Orthopedic Surgery | Admitting: Physical Therapy

## 2017-12-08 ENCOUNTER — Encounter: Payer: Self-pay | Admitting: Physical Therapy

## 2017-12-08 DIAGNOSIS — M25561 Pain in right knee: Secondary | ICD-10-CM | POA: Diagnosis present

## 2017-12-08 DIAGNOSIS — R600 Localized edema: Secondary | ICD-10-CM | POA: Diagnosis present

## 2017-12-08 DIAGNOSIS — M6281 Muscle weakness (generalized): Secondary | ICD-10-CM | POA: Diagnosis present

## 2017-12-08 DIAGNOSIS — G8929 Other chronic pain: Secondary | ICD-10-CM | POA: Diagnosis present

## 2017-12-08 NOTE — Therapy (Signed)
Status  New             Plan - 12/08/17 1257    Clinical Impression Statement  The patient presents to OPPT s/p right total knee replacement performed on 11/19/17.  She has been very compliant to her HEP.  She lack both flexion and extension and has strength losses as well in her right hip and knee.  She also has a notable amount of edema.  She demonstrates an antalgic gait with her right knee held in flexion.  The patient is expected to do will with skilled physical  therapy intervention to address deficits.    Clinical Presentation  Stable    Clinical Decision Making  Low    Rehab Potential  Excellent    PT Frequency  3x / week    PT Duration  4 weeks    PT Treatment/Interventions  ADLs/Self Care Home Management;Cryotherapy;Electrical Stimulation;Gait training;Stair training;Functional mobility training;Therapeutic activities;Therapeutic exercise;Neuromuscular re-education;Patient/family education;Passive range of motion;Manual techniques;Vasopneumatic Device    PT Next Visit Plan  Nutstep with progression to bike; total knee protocol.  Vasopnuematic and e'stim.  PROM.  Progress to PRE's.    Consulted and Agree with Plan of Care  Patient       Patient will benefit from skilled therapeutic intervention in order to improve the following deficits and impairments:  Abnormal gait, Decreased activity tolerance, Pain, Decreased range of motion, Increased edema, Decreased strength  Visit Diagnosis: Chronic pain of right knee - Plan: PT plan of care cert/re-cert  Localized edema - Plan: PT plan of care cert/re-cert  Muscle weakness (generalized) - Plan: PT plan of care cert/re-cert     Problem List Patient Active Problem List   Diagnosis Date Noted  . 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 Tendonitis of knee, right 06/11/2017  . Obesity (BMI 30-39.9) 06/21/2016  . Allergic rhinitis 02/26/2016  . Right knee pain 02/26/2016    Jenna Smith, Jenna Smith 12/08/2017, 1:27 PM  [facility] [address] [place], [place], [address] Phone: [phone]   Fax:  [phone]  Name: Jenna Smith MRN: [id number] Date of Birth: June 12, 1951  [facility] [place], [place], [address] Phone: [phone]   Fax:  [phone]  Physical Therapy Evaluation  Patient Details  Name: Jenna Smith MRN: [id number] Date of Birth: 21-Nov-1950 Referring Provider: [name] MD.   Encounter Date: 12/08/2017  PT End of Session - 12/08/17 1227    Visit Number  1    Number of Visits  12    Date for PT Re-Evaluation  01/05/18    Authorization Type  FOTO AT LEAST EVERY 5TH VISIT.    PT Start Time  1033    PT Stop Time  1119    PT Time Calculation (min)  46 min    Equipment Utilized During Treatment  Gait belt;Right knee immobilizer    Activity Tolerance  Patient limited by fatigue;Patient tolerated treatment well    Behavior During Therapy  WFL for tasks assessed/performed       Past Medical History:  Diagnosis Date  . Allergy     Past Surgical History:  Procedure Laterality Date  . BREAST SURGERY Bilateral May 2001   Breast reduction  . COSMETIC SURGERY  September 2012   Face lift  . TONSILLECTOMY    . 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   Procedure: RIGHT TOTAL KNEE ARTHROPLASTY;  Surgeon: [name], MD;  Location: [facility];  Service: Orthopedics;  Laterality: Right;    There were no vitals filed for this visit.   Subjective Assessment - 12/08/17 1237    Subjective  The patient underwent a right total knee replacement performed on 11/19/17.  She has had HH PT and did well.  She also states that her daughter-in-law has been helping her with her HEP three times a day.  She reports minimal pain ta rest and of course, much higher pain-levels with ROM activites.      Patient is accompained by:  Family member Son present.    Limitations  Walking    How long can you walk comfortably?  Short distances around house with straight cane.    Patient Stated Goals  Walk with pain or using a cane.    Pain Score  3     Pain Location  Knee    Pain Orientation   Right    Pain Descriptors / Indicators  Aching;Dull    Pain Type  Surgical pain    Pain Frequency  Constant         [facility] PT Assessment - 12/08/17 0001      Assessment   Medical Diagnosis  Right total knee replacement.    Referring Provider  [name] MD.    Onset Date/Surgical Date  -- 11/19/17 (surgery date).      Precautions   Precautions  -- No ultrasound.      Restrictions   Weight Bearing Restrictions  No      Balance Screen   Has the patient fallen in the past 6 months  No    Has the patient had a decrease in activity level because of a fear of falling?   No    Is the patient reluctant to leave their home because of a fear of falling?   No      Prior Function   Level of Independence  Independent      Observation/Other Assessments   Observations  Right knee incision looks to be healing well.    Focus on Therapeutic Outcomes (FOTO)   74% limitation.      ROM / Strength  [facility] [place], [place], [address] Phone: [phone]   Fax:  [phone]  Physical Therapy Evaluation  Patient Details  Name: Jenna Smith MRN: [id number] Date of Birth: 21-Nov-1950 Referring Provider: [name] MD.   Encounter Date: 12/08/2017  PT End of Session - 12/08/17 1227    Visit Number  1    Number of Visits  12    Date for PT Re-Evaluation  01/05/18    Authorization Type  FOTO AT LEAST EVERY 5TH VISIT.    PT Start Time  1033    PT Stop Time  1119    PT Time Calculation (min)  46 min    Equipment Utilized During Treatment  Gait belt;Right knee immobilizer    Activity Tolerance  Patient limited by fatigue;Patient tolerated treatment well    Behavior During Therapy  WFL for tasks assessed/performed       Past Medical History:  Diagnosis Date  . Allergy     Past Surgical History:  Procedure Laterality Date  . BREAST SURGERY Bilateral May 2001   Breast reduction  . COSMETIC SURGERY  September 2012   Face lift  . TONSILLECTOMY    . 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   Procedure: RIGHT TOTAL KNEE ARTHROPLASTY;  Surgeon: [name], MD;  Location: [facility];  Service: Orthopedics;  Laterality: Right;    There were no vitals filed for this visit.   Subjective Assessment - 12/08/17 1237    Subjective  The patient underwent a right total knee replacement performed on 11/19/17.  She has had HH PT and did well.  She also states that her daughter-in-law has been helping her with her HEP three times a day.  She reports minimal pain ta rest and of course, much higher pain-levels with ROM activites.      Patient is accompained by:  Family member Son present.    Limitations  Walking    How long can you walk comfortably?  Short distances around house with straight cane.    Patient Stated Goals  Walk with pain or using a cane.    Pain Score  3     Pain Location  Knee    Pain Orientation   Right    Pain Descriptors / Indicators  Aching;Dull    Pain Type  Surgical pain    Pain Frequency  Constant         [facility] PT Assessment - 12/08/17 0001      Assessment   Medical Diagnosis  Right total knee replacement.    Referring Provider  [name] MD.    Onset Date/Surgical Date  -- 11/19/17 (surgery date).      Precautions   Precautions  -- No ultrasound.      Restrictions   Weight Bearing Restrictions  No      Balance Screen   Has the patient fallen in the past 6 months  No    Has the patient had a decrease in activity level because of a fear of falling?   No    Is the patient reluctant to leave their home because of a fear of falling?   No      Prior Function   Level of Independence  Independent      Observation/Other Assessments   Observations  Right knee incision looks to be healing well.    Focus on Therapeutic Outcomes (FOTO)   74% limitation.      ROM / Strength

## 2017-12-10 ENCOUNTER — Ambulatory Visit: Payer: BLUE CROSS/BLUE SHIELD | Admitting: *Deleted

## 2017-12-10 DIAGNOSIS — R600 Localized edema: Secondary | ICD-10-CM

## 2017-12-10 DIAGNOSIS — M25561 Pain in right knee: Secondary | ICD-10-CM | POA: Diagnosis not present

## 2017-12-10 DIAGNOSIS — G8929 Other chronic pain: Secondary | ICD-10-CM

## 2017-12-10 DIAGNOSIS — M6281 Muscle weakness (generalized): Secondary | ICD-10-CM

## 2017-12-10 NOTE — Therapy (Signed)
knee extension in order to normalize gait.    Time  2    Period  Weeks    Status  New        PT Long Term Goals - 12/08/17 1321      PT LONG TERM GOAL #1   Title  Independent with an advanced HEP.    Time  4    Period  Weeks    Status  New      PT LONG TERM GOAL #2   Title  Active right knee flexion to 115 degrees+ so the patient can perform functional tasks and do so with pain not > 2-3/10.    Time  4    Period  Weeks    Status  New      PT LONG TERM GOAL #3   Title  Increase right hip/knee strength to a solid 5/5 to provide good stability for accomplishment of functional activities    Time  4    Period  Weeks    Status  New      PT LONG TERM GOAL #4   Title  Decrease edema to within 3 cms of non-affected side to assist with pain reduction and range of motion gains.    Time  4    Period  Weeks    Status  New      PT LONG TERM GOAL #5   Title  Perform a reciprocating stair gait with one railing with pain not > 2-3/10.    Time  4    Period  Weeks    Status  New            Plan - 12/10/17 1228    Clinical Impression Statement  Pt arrived today doing fairly well with RT knee. She is performing HEP 3x daily at this time and ambulating with a cane. She was limited  in flexion to 94 degrees and extension to 8 degrees. Still with notable edema and VMO inhibition. Good patella and scar mobility. Normal modality response.     Clinical Presentation  Stable    Rehab Potential  Excellent    PT Frequency  3x / week    PT Duration  4 weeks    PT Treatment/Interventions  ADLs/Self Care Home Management;Cryotherapy;Electrical Stimulation;Gait training;Stair training;Functional mobility training;Therapeutic activities;Therapeutic exercise;Neuromuscular re-education;Patient/family education;Passive range of motion;Manual techniques;Vasopneumatic Device    PT Next Visit Plan  Nutstep with progression to bike; total knee protocol.  Vasopnuematic and e'stim.  PROM.  Progress to PRE's.    Consulted and Agree with Plan of Care  Patient       Patient will benefit from skilled therapeutic intervention in order to improve the following deficits and impairments:  Abnormal gait, Decreased activity tolerance, Pain, Decreased range of motion, Increased edema, Decreased strength  Visit Diagnosis: Chronic pain of right knee  Localized edema  Muscle weakness (generalized)     Problem List Patient Active Problem List   Diagnosis Date Noted  . 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 Tendonitis of knee, right 06/11/2017  . Obesity (BMI 30-39.9) 06/21/2016  . Allergic rhinitis 02/26/2016  . Right knee pain 02/26/2016    RAMSEUR,CHRIS, PTA 12/10/2017, 12:37 PM  [facility] [place], [place], [address] Phone: [phone]   Fax:  [phone]  Name: Jenna Smith MRN: [id number] Date of Birth: 10-11-1951  [facility] [place], [place], [address] Phone: [phone]   Fax:  [phone]  Physical Therapy Treatment  Patient Details  Name: Jenna Smith MRN: [id number] Date of Birth: 14-Nov-1950 Referring Provider: [name] MD.   Encounter Date: 12/10/2017  PT End of Session - 12/10/17 1228    Visit Number  2    Number of Visits  12    Date for PT Re-Evaluation  01/05/18    Authorization Type  FOTO AT LEAST EVERY 5TH VISIT.    PT Start Time  1030    PT Stop Time  1130    PT Time Calculation (min)  60 min       Past Medical History:  Diagnosis Date  . Allergy     Past Surgical History:  Procedure Laterality Date  . BREAST SURGERY Bilateral May 2001   Breast reduction  . COSMETIC SURGERY  September 2012   Face lift  . TONSILLECTOMY    . 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   Procedure: RIGHT TOTAL KNEE ARTHROPLASTY;  Surgeon: [name], MD;  Location: [facility];  Service: Orthopedics;  Laterality: Right;    There were no vitals filed for this visit.  Subjective Assessment - 12/10/17 1120    Subjective  Doing ok with RT knee    Patient is accompained by:  Family member    Limitations  Walking    How long can you walk comfortably?  Short distances around house with straight cane.    Patient Stated Goals  Walk with pain or using a cane.    Currently in Pain?  Yes    Pain Score  3     Pain Location  Knee    Pain Orientation  Right    Pain Descriptors / Indicators  Aching;Dull    Pain Type  Surgical pain    Pain Frequency  Constant                      [facility] Adult PT Treatment/Exercise - 12/10/17 0001      Exercises   Exercises  Knee/Hip      Knee/Hip Exercises: Standing   Hip Flexion  AROM;Both;3 sets;10 reps    Rocker Board  5 minutes      Knee/Hip Exercises: Seated   Long Arc Quad  Strengthening;3 sets;10 reps    Long Arc Quad Weight  2 lbs.      Modalities   Modalities  Health visitor Stimulation Location  Right knee.  IFC 1-10hz  x 15 mins    Electrical Stimulation Goals  Edema;Pain      Vasopneumatic   Number Minutes Vasopneumatic   15 minutes    Vasopnuematic Location   Knee    Vasopneumatic Pressure  Medium    Vasopneumatic Temperature   34      Manual Therapy   Manual Therapy  Passive ROM    Passive ROM  PROM for knee extension, patella mobs and scar mobs               PT Short Term Goals - 12/08/17 1320      PT SHORT TERM GOAL #1   Title  Independent with an initial HEP.    Time  2    Period  Weeks    Status  New      PT SHORT TERM GOAL #2   Title  Full active right

## 2017-12-12 ENCOUNTER — Ambulatory Visit: Payer: BLUE CROSS/BLUE SHIELD | Admitting: Physical Therapy

## 2017-12-12 ENCOUNTER — Encounter: Payer: Self-pay | Admitting: Physical Therapy

## 2017-12-12 DIAGNOSIS — M25561 Pain in right knee: Principal | ICD-10-CM

## 2017-12-12 DIAGNOSIS — G8929 Other chronic pain: Secondary | ICD-10-CM

## 2017-12-12 NOTE — Therapy (Signed)
[facility] [place], [place], [address] Phone: [phone]   Fax:  [phone]  Physical Therapy Treatment  Patient Details  Name: Jenna Smith MRN: [id number] Date of Birth: 03/02/1951 Referring Provider: [name] MD.   Encounter Date: 12/12/2017  PT End of Session - 12/12/17 1054    Visit Number  3    Number of Visits  12    Date for PT Re-Evaluation  01/05/18    Authorization Type  FOTO AT LEAST EVERY 5TH VISIT.    PT Start Time  1041 Late arrival.    PT Stop Time  1126    PT Time Calculation (min)  45 min    Activity Tolerance  Patient limited by fatigue;Patient tolerated treatment well    Behavior During Therapy  [facility] for tasks assessed/performed       Past Medical History:  Diagnosis Date  . Allergy     Past Surgical History:  Procedure Laterality Date  . BREAST SURGERY Bilateral May 2001   Breast reduction  . COSMETIC SURGERY  September 2012   Face lift  . TONSILLECTOMY    . 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   Procedure: RIGHT TOTAL KNEE ARTHROPLASTY;  Surgeon: [name], MD;  Location: [facility];  Service: Orthopedics;  Laterality: Right;    There were no vitals filed for this visit.  Subjective Assessment - 12/12/17 1117    Subjective  I'm doing a lot at home.  Got down on my knees to look for the cat under the bed.    How long can you walk comfortably?  Short distances around house with straight cane.    Pain Score  3     Pain Descriptors / Indicators  Aching;Dull    Pain Frequency  Constant                      [facility] Adult PT Treatment/Exercise - 12/12/17 0001      Exercises   Exercises  Lumbar      Lumbar Exercises: Aerobic   Nustep  Level 4 moving forward x 2 to increase knee flexion.x 15 minutes.      Modalities   Modalities  Electrical engineer Stimulation Location  Right knee IFC    Electrical  Stimulation Action  1-10 Hz x 15 minutes.    Electrical Stimulation Goals  Edema;Pain      Vasopneumatic   Number Minutes Vasopneumatic   15 minutes    Vasopnuematic Location   -- Right knee.    Vasopneumatic Pressure  Low      Manual Therapy   Manual Therapy  Passive ROM    Passive ROM  PROM to patient tolerance x 8 minutes with majority of time spent on flexion.               PT Short Term Goals - 12/08/17 1320      PT SHORT TERM GOAL #1   Title  Independent with an initial HEP.    Time  2    Period  Weeks    Status  New      PT SHORT TERM GOAL #2   Title  Full active right knee extension in order to normalize gait.    Time  2    Period  Weeks    Status  New        PT Long Term Goals - 12/08/17 1321  PT LONG TERM GOAL #1   Title  Independent with an advanced HEP.    Time  4    Period  Weeks    Status  New      PT LONG TERM GOAL #2   Title  Active right knee flexion to 115 degrees+ so the patient can perform functional tasks and do so with pain not > 2-3/10.    Time  4    Period  Weeks    Status  New      PT LONG TERM GOAL #3   Title  Increase right hip/knee strength to a solid 5/5 to provide good stability for accomplishment of functional activities    Time  4    Period  Weeks    Status  New      PT LONG TERM GOAL #4   Title  Decrease edema to within 3 cms of non-affected side to assist with pain reduction and range of motion gains.    Time  4    Period  Weeks    Status  New      PT LONG TERM GOAL #5   Title  Perform a reciprocating stair gait with one railing with pain not > 2-3/10.    Time  4    Period  Weeks    Status  New            Plan - 12/12/17 1148    Clinical Impression Statement  The patient did well today.  She states she had access to a stationary bike.  I demonstrated how she can begin with half-moon technique for stretching.    PT Treatment/Interventions  ADLs/Self Care Home Management;Cryotherapy;Electrical  Stimulation;Gait training;Stair training;Functional mobility training;Therapeutic activities;Therapeutic exercise;Neuromuscular re-education;Patient/family education;Passive range of motion;Manual techniques;Vasopneumatic Device    PT Next Visit Plan  Nutstep with progression to bike; total knee protocol.  Vasopnuematic and e'stim.  PROM.  Progress to PRE's.    Consulted and Agree with Plan of Care  Patient       Patient will benefit from skilled therapeutic intervention in order to improve the following deficits and impairments:  Abnormal gait, Decreased activity tolerance, Pain, Decreased range of motion, Increased edema, Decreased strength  Visit Diagnosis: Chronic pain of right knee     Problem List Patient Active Problem List   Diagnosis Date Noted  . 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 Tendonitis of knee, right 06/11/2017  . Obesity (BMI 30-39.9) 06/21/2016  . Allergic rhinitis 02/26/2016  . Right knee pain 02/26/2016    Jenna Smith, [place] MPT 12/12/2017, 11:49 AM  [facility] [address] [place], [place], [address] Phone: [phone]   Fax:  [phone]  Name: Jenna Smith MRN: [id number] Date of Birth: 01/11/1951

## 2017-12-15 ENCOUNTER — Ambulatory Visit: Payer: BLUE CROSS/BLUE SHIELD | Admitting: Physical Therapy

## 2017-12-15 DIAGNOSIS — M25561 Pain in right knee: Principal | ICD-10-CM

## 2017-12-15 DIAGNOSIS — M6281 Muscle weakness (generalized): Secondary | ICD-10-CM

## 2017-12-15 DIAGNOSIS — R600 Localized edema: Secondary | ICD-10-CM

## 2017-12-15 DIAGNOSIS — G8929 Other chronic pain: Secondary | ICD-10-CM

## 2017-12-15 NOTE — Therapy (Signed)
[facility] [address] [place], [place], [address] Phone: [phone]   Fax:  [phone]  Physical Therapy Treatment  Patient Details  Name: Jenna Smith MRN: [id number] Date of Birth: 1950/11/13 Referring Provider: Ranee Gosselin MD.   Encounter Date: 12/15/2017  PT End of Session - 12/15/17 1525    Visit Number  4    Number of Visits  12    Date for PT Re-Evaluation  01/05/18    Authorization Type  FOTO AT LEAST EVERY 5TH VISIT.    PT Start Time  1430    PT Stop Time  1533    PT Time Calculation (min)  63 min    Activity Tolerance  Patient tolerated treatment well    Behavior During Therapy  WFL for tasks assessed/performed       Past Medical History:  Diagnosis Date  . Allergy     Past Surgical History:  Procedure Laterality Date  . BREAST SURGERY Bilateral May 2001   Breast reduction  . COSMETIC SURGERY  September 2012   Face lift  . TONSILLECTOMY    . 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   Procedure: RIGHT TOTAL KNEE ARTHROPLASTY;  Surgeon: Ranee Gosselin, MD;  Location: [facility];  Service: Orthopedics;  Laterality: Right;    There were no vitals filed for this visit.  Subjective Assessment - 12/15/17 1448    Subjective  Patient states she was able to get around on her bike at home. She forgot to take any pain meds today and has been sitting a lot prior to therapy.    Limitations  Walking    How long can you walk comfortably?  Short distances around house with straight cane.    Patient Stated Goals  Walk with pain or using a cane.    Currently in Pain?  Yes    Pain Score  5     Pain Location  Knee    Pain Orientation  Right    Pain Descriptors / Indicators  Aching    Pain Type  Surgical pain    Pain Onset  1 to 4 weeks ago    Pain Frequency  Intermittent    Aggravating Factors   bending    Pain Relieving Factors  rest, ice, elevation    Effect of Pain on Daily Activities  limited with ADLS          [facility] PT Assessment - 12/15/17 0001      ROM / Strength   AROM / PROM / Strength  AROM;PROM;Strength      AROM   AROM Assessment Site  Knee    Right/Left Knee  Right    Right Knee Extension  -7 after work on ITB    Right Knee Flexion  92      PROM   PROM Assessment Site  Knee    Right/Left Knee  Right    Right Knee Extension  -5    Right Knee Flexion  104      Strength   Overall Strength Comments  R knee ext 5-/5, flex 5-/5 in sitting                  [facility] Adult PT Treatment/Exercise - 12/15/17 0001      Lumbar Exercises: Aerobic   Recumbent Bike  L0 x 6 min partial revolutions    Nustep  Level 4 moving forward x 2 to increase knee flexion.x 10 minutes.      Knee/Hip Exercises:  Standing   Hip Flexion  Stengthening;Right;2 sets;10 reps    Terminal Knee Extension  AAROM;Right;1 set;15 reps;Theraband with PT assist    Theraband Level (Terminal Knee Extension)  Level 1 (Yellow)      Modalities   Modalities  Electrical Stimulation;Vasopneumatic      Electrical Stimulation   Electrical Stimulation Location  R knee IFC 1-10 Hz x 15 min    Electrical Stimulation Goals  Edema;Pain      Vasopneumatic   Number Minutes Vasopneumatic   15 minutes    Vasopnuematic Location   Knee    Vasopneumatic Pressure  Low    Vasopneumatic Temperature   34      Manual Therapy   Manual Therapy  Passive ROM;Myofascial release    Myofascial Release  to R ITB    Passive ROM  STW to R lateral quads; PROM into extension               PT Short Term Goals - 12/15/17 1513      PT SHORT TERM GOAL #1   Title  Independent with an initial HEP.    Time  2    Period  Weeks    Status  Achieved      PT SHORT TERM GOAL #2   Title  Full active right knee extension in order to normalize gait.    Time  2    Period  Weeks    Status  On-going        PT Long Term Goals - 12/15/17 1514      PT LONG TERM GOAL #1   Title  Independent with an advanced HEP.    Time  4     Period  Weeks    Status  On-going      PT LONG TERM GOAL #2   Title  Active right knee flexion to 115 degrees+ so the patient can perform functional tasks and do so with pain not > 2-3/10.    Time  4    Period  Weeks    Status  On-going      PT LONG TERM GOAL #3   Title  Increase right hip/knee strength to a solid 5/5 to provide good stability for accomplishment of functional activities    Time  4    Period  Weeks    Status  On-going      PT LONG TERM GOAL #4   Title  Decrease edema to within 3 cms of non-affected side to assist with pain reduction and range of motion gains.    Time  4    Period  Weeks    Status  On-going      PT LONG TERM GOAL #5   Title  Perform a reciprocating stair gait with one railing with pain not > 2-3/10.    Time  4    Period  Weeks    Status  On-going            Plan - 12/15/17 1525    Clinical Impression Statement  Patient is progressing well with ROM and strength. Her R knee AROM today is -7 to 92 degrees with PROM -5 to 104 deg. She was able to do full revolutions on the bike at home, but still cannot of the recumbant bike in the clinic. She demos good quad control with SLR. She states that she has been driving herself and climbing stairs with a step to gait. She presently uses a SPC in the  clinic.    Rehab Potential  Excellent    PT Frequency  3x / week    PT Duration  4 weeks    PT Treatment/Interventions  ADLs/Self Care Home Management;Cryotherapy;Electrical Stimulation;Gait training;Stair training;Functional mobility training;Therapeutic activities;Therapeutic exercise;Neuromuscular re-education;Patient/family education;Passive range of motion;Manual techniques;Vasopneumatic Device    PT Next Visit Plan  FOTO 5TH VISIT; Nutstep with progression to bike; total knee protocol.  Vasopnuematic and e'stim.  PROM.  Progress to PRE's.    Consulted and Agree with Plan of Care  Patient       Patient will benefit from skilled therapeutic  intervention in order to improve the following deficits and impairments:  Abnormal gait, Decreased activity tolerance, Pain, Decreased range of motion, Increased edema, Decreased strength  Visit Diagnosis: Chronic pain of right knee  Localized edema  Muscle weakness (generalized)     Problem List Patient Active Problem List   Diagnosis Date Noted  . 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 Tendonitis of knee, right 06/11/2017  . Obesity (BMI 30-39.9) 06/21/2016  . Allergic rhinitis 02/26/2016  . Right knee pain 02/26/2016    Jenna Smith PT 12/15/2017, 3:37 PM  [facility] [address] [place], [place], [address] Phone: [phone]   Fax:  [phone]  Name: Jenna Smith MRN: [id number] Date of Birth: 08-17-1951

## 2017-12-17 ENCOUNTER — Encounter: Payer: Self-pay | Admitting: Physical Therapy

## 2017-12-17 ENCOUNTER — Ambulatory Visit: Payer: BLUE CROSS/BLUE SHIELD | Admitting: Physical Therapy

## 2017-12-17 DIAGNOSIS — R600 Localized edema: Secondary | ICD-10-CM

## 2017-12-17 DIAGNOSIS — M25561 Pain in right knee: Principal | ICD-10-CM

## 2017-12-17 DIAGNOSIS — G8929 Other chronic pain: Secondary | ICD-10-CM

## 2017-12-17 DIAGNOSIS — M6281 Muscle weakness (generalized): Secondary | ICD-10-CM

## 2017-12-17 NOTE — Therapy (Signed)
[facility] [place], [place], [address] Phone: [phone]   Fax:  [phone]  Physical Therapy Treatment  Patient Details  Name: Jenna Smith MRN: [id number] Date of Birth: [birth year] Referring Provider: [name] MD.   Encounter Date: 12/17/2017  PT End of Session - 12/17/17 1342    Visit Number  5    Number of Visits  12    Date for PT Re-Evaluation  01/05/18    Authorization Type  FOTO AT LEAST EVERY 5TH VISIT.    PT Start Time  1316    PT Stop Time  1412    PT Time Calculation (min)  56 min    Activity Tolerance  Patient tolerated treatment well    Behavior During Therapy  WFL for tasks assessed/performed       Past Medical History:  Diagnosis Date  . Allergy     Past Surgical History:  Procedure Laterality Date  . BREAST SURGERY Bilateral May 2001   Breast reduction  . COSMETIC SURGERY  September 2012   Face lift  . TONSILLECTOMY    . 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   Procedure: RIGHT TOTAL KNEE ARTHROPLASTY;  Surgeon: [name], MD;  Location: [facility];  Service: Orthopedics;  Laterality: Right;    There were no vitals filed for this visit.  Subjective Assessment - 12/17/17 1317    Subjective  Patient arrived with ongoing c/o soreness due to weather    Limitations  Walking    How long can you walk comfortably?  Short distances around house with straight cane.    Patient Stated Goals  Walk with pain or using a cane.    Currently in Pain?  Yes    Pain Score  5     Pain Location  Knee    Pain Orientation  Right    Pain Descriptors / Indicators  Aching;Discomfort;Sore    Pain Type  Surgical pain    Pain Onset  More than a month ago    Pain Frequency  Intermittent    Aggravating Factors   bening knee    Pain Relieving Factors  rest         [facility] PT Assessment - 12/17/17 0001      AROM   AROM Assessment Site  Knee    Right/Left Knee  Right    Right Knee Extension   -8    Right Knee Flexion  93      PROM   PROM Assessment Site  Knee    Right/Left Knee  Right    Right Knee Extension  -5    Right Knee Flexion  105                  [facility] Adult PT Treatment/Exercise - 12/17/17 0001      Lumbar Exercises: Aerobic   Nustep  42min L5 UE/LE activity      Knee/Hip Exercises: Standing   Terminal Knee Extension  Strengthening;Right;20 reps;Limitations;Theraband    Terminal Knee Extension Limitations  pink XTS      Electrical Stimulation   Electrical Stimulation Location  R knee IFC 1-10 Hz x 15 min    Electrical Stimulation Goals  Edema;Pain      Vasopneumatic   Number Minutes Vasopneumatic   15 minutes    Vasopnuematic Location   Knee    Vasopneumatic Pressure  Low      Manual Therapy   Manual Therapy  Passive ROM    Passive  Diagnosis Date Noted  . 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 Tendonitis of knee, right 06/11/2017  . Obesity (BMI 30-39.9) 06/21/2016  . Allergic rhinitis 02/26/2016  . Right knee pain 02/26/2016    DUNFORD, CHRISTINA P, PTA 12/17/2017, 2:17 PM  [facility] [place], [place], [address] Phone: [phone]   Fax:  [phone]  Name: Jenna Smith MRN: [id number] Date of Birth: July 27, 1951  Diagnosis Date Noted  . 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 Tendonitis of knee, right 06/11/2017  . Obesity (BMI 30-39.9) 06/21/2016  . Allergic rhinitis 02/26/2016  . Right knee pain 02/26/2016    DUNFORD, CHRISTINA P, PTA 12/17/2017, 2:17 PM  [facility] [place], [place], [address] Phone: [phone]   Fax:  [phone]  Name: Jenna Smith MRN: [id number] Date of Birth: July 27, 1951

## 2017-12-18 ENCOUNTER — Ambulatory Visit: Payer: BLUE CROSS/BLUE SHIELD | Admitting: Family

## 2017-12-18 ENCOUNTER — Encounter: Payer: Self-pay | Admitting: Family

## 2017-12-18 VITALS — BP 98/70 | HR 85 | Temp 97.1°F | Ht 66.0 in | Wt 170.0 lb

## 2017-12-18 DIAGNOSIS — J209 Acute bronchitis, unspecified: Secondary | ICD-10-CM | POA: Diagnosis not present

## 2017-12-18 DIAGNOSIS — J029 Acute pharyngitis, unspecified: Secondary | ICD-10-CM

## 2017-12-18 LAB — CULTURE, GROUP A STREP

## 2017-12-18 LAB — RAPID STREP SCREEN (MED CTR MEBANE ONLY): Strep Gp A Ag, IA W/Reflex: NEGATIVE

## 2017-12-18 MED ORDER — PREDNISONE 5 MG (21) PO TBPK: ORAL_TABLET | ORAL | 0 refills | Status: DC

## 2017-12-18 MED ORDER — PREDNISONE 10 MG (21) PO TBPK: ORAL_TABLET | ORAL | 0 refills | Status: DC

## 2017-12-18 MED ORDER — HYDROCODONE-HOMATROPINE 5-1.5 MG/5ML PO SYRP: 5.0000 mL | ORAL_SOLUTION | Freq: Three times a day (TID) | ORAL | 0 refills | Status: DC | PRN

## 2017-12-18 NOTE — Addendum Note (Signed)
Addended by: Shelbie Ammons on: 12/18/2017 12:16 PM   Modules accepted: Orders

## 2017-12-18 NOTE — Progress Notes (Signed)
Subjective:    Patient ID: Jenna Smith, female    DOB: 04/13/1951, 67 y.o.   MRN: [id number]  Sore Throat   This is a new problem. The current episode started in the past 7 days. The problem has been waxing and waning. There has been no fever. Associated symptoms include congestion, coughing, a hoarse voice and swollen glands. Pertinent negatives include no ear pain, headaches or trouble swallowing. She has tried acetaminophen and NSAIDs for the symptoms. The treatment provided mild relief.  Cough  This is a new problem. The current episode started in the past 7 days. The problem has been waxing and waning. The problem occurs every few minutes. The cough is non-productive. Pertinent negatives include no ear pain or headaches.      Review of Systems  HENT: Positive for congestion and hoarse voice. Negative for ear pain and trouble swallowing.   Respiratory: Positive for cough.   Neurological: Negative for headaches.  All other systems reviewed and are negative.      Objective:   Physical Exam  Constitutional: She is oriented to person, place, and time. She appears well-developed and well-nourished. No distress.  HENT:  Head: Normocephalic and atraumatic.  Right Ear: External ear normal.  Left Ear: External ear normal.  Nose: Mucosal edema and rhinorrhea present.  Mouth/Throat: Posterior oropharyngeal erythema present.  Eyes: Pupils are equal, round, and reactive to light.  Neck: Normal range of motion. Neck supple. No thyromegaly present.  Cardiovascular: Normal rate, regular rhythm, normal heart sounds and intact distal pulses.  No murmur heard. Pulmonary/Chest: Effort normal and breath sounds normal. No respiratory distress. She has no wheezes.  Intermittent nonproductive   Abdominal: Soft. Bowel sounds are normal. She exhibits no distension. There is no tenderness.  Musculoskeletal: Normal range of motion. She exhibits no edema or tenderness.  Neurological: She is  alert and oriented to person, place, and time.  Skin: Skin is warm and dry.  Psychiatric: She has a normal mood and affect. Her behavior is normal. Judgment and thought content normal.  Vitals reviewed.    BP 98/70   Pulse 85   Temp (!) 97.1 F (36.2 C) (Oral)   Ht 5\' 6"  (1.676 m)   Wt 170 lb (77.1 kg)   BMI 27.44 kg/m      Assessment & Plan:  1. Sore throat - Rapid Strep A  2. Acute bronchitis, unspecified organism - Take meds as prescribed - Use a cool mist humidifier  -Use saline nose sprays frequently -Force fluids -For any cough or congestion  Use plain Mucinex- regular strength or max strength is fine -For fever or aces or pains- take tylenol or ibuprofen. -Throat lozenges if help -New toothbrush in 3 days - predniSONE (STERAPRED UNI-PAK 21 TAB) 5 MG (21) TBPK tablet; Use as directed  Dispense: 21 tablet; Refill: 0 - HYDROcodone-homatropine (HYCODAN) 5-1.5 MG/5ML syrup; Take 5 mLs by mouth every 8 (eight) hours as needed for cough.  Dispense: 240 mL; Refill: 0     Jannifer Rodney, FNP

## 2017-12-18 NOTE — Patient Instructions (Signed)

## 2017-12-19 ENCOUNTER — Encounter: Payer: BLUE CROSS/BLUE SHIELD | Admitting: *Deleted

## 2017-12-22 ENCOUNTER — Emergency Department (HOSPITAL_COMMUNITY): Payer: BLUE CROSS/BLUE SHIELD

## 2017-12-22 ENCOUNTER — Encounter (HOSPITAL_COMMUNITY): Payer: Self-pay | Admitting: Cardiology

## 2017-12-22 ENCOUNTER — Encounter: Payer: BLUE CROSS/BLUE SHIELD | Admitting: Physical Therapy

## 2017-12-22 ENCOUNTER — Observation Stay (HOSPITAL_COMMUNITY)
Admission: EM | Admit: 2017-12-22 | Discharge: 2017-12-24 | Disposition: A | Discharge status: Home / Self Care | Payer: BLUE CROSS/BLUE SHIELD | Attending: Family Medicine | Admitting: Family Medicine

## 2017-12-22 ENCOUNTER — Other Ambulatory Visit: Payer: Self-pay

## 2017-12-22 DIAGNOSIS — D649 Anemia, unspecified: Secondary | ICD-10-CM | POA: Diagnosis present

## 2017-12-22 DIAGNOSIS — R55 Syncope and collapse: Principal | ICD-10-CM | POA: Insufficient documentation

## 2017-12-22 DIAGNOSIS — R4182 Altered mental status, unspecified: Secondary | ICD-10-CM | POA: Insufficient documentation

## 2017-12-22 DIAGNOSIS — Z7982 Long term (current) use of aspirin: Secondary | ICD-10-CM | POA: Insufficient documentation

## 2017-12-22 DIAGNOSIS — N390 Urinary tract infection, site not specified: Secondary | ICD-10-CM | POA: Insufficient documentation

## 2017-12-22 DIAGNOSIS — S020XXA Fracture of vault of skull, initial encounter for closed fracture: Secondary | ICD-10-CM | POA: Diagnosis not present

## 2017-12-22 DIAGNOSIS — Z91040 Latex allergy status: Secondary | ICD-10-CM | POA: Diagnosis not present

## 2017-12-22 DIAGNOSIS — S06361A Traumatic hemorrhage of cerebrum, unspecified, with loss of consciousness of 30 minutes or less, initial encounter: Secondary | ICD-10-CM | POA: Insufficient documentation

## 2017-12-22 DIAGNOSIS — Y999 Unspecified external cause status: Secondary | ICD-10-CM | POA: Diagnosis not present

## 2017-12-22 DIAGNOSIS — E876 Hypokalemia: Secondary | ICD-10-CM | POA: Diagnosis not present

## 2017-12-22 DIAGNOSIS — Y939 Activity, unspecified: Secondary | ICD-10-CM | POA: Insufficient documentation

## 2017-12-22 DIAGNOSIS — I619 Nontraumatic intracerebral hemorrhage, unspecified: Secondary | ICD-10-CM | POA: Diagnosis present

## 2017-12-22 DIAGNOSIS — Y92512 Supermarket, store or market as the place of occurrence of the external cause: Secondary | ICD-10-CM | POA: Diagnosis not present

## 2017-12-22 DIAGNOSIS — S062X1A Diffuse traumatic brain injury with loss of consciousness of 30 minutes or less, initial encounter: Secondary | ICD-10-CM

## 2017-12-22 DIAGNOSIS — X58XXXA Exposure to other specified factors, initial encounter: Secondary | ICD-10-CM | POA: Diagnosis not present

## 2017-12-22 DIAGNOSIS — R569 Unspecified convulsions: Secondary | ICD-10-CM

## 2017-12-22 HISTORY — DX: Anemia, unspecified: D64.9

## 2017-12-22 LAB — COMPREHENSIVE METABOLIC PANEL
ALBUMIN: 3.5 g/dL (ref 3.5–5.0)
ALT: 13 U/L — ABNORMAL LOW (ref 14–54)
ANION GAP: 10 (ref 5–15)
AST: 14 U/L — ABNORMAL LOW (ref 15–41)
Alkaline Phosphatase: 114 U/L (ref 38–126)
BILIRUBIN TOTAL: 0.2 mg/dL — AB (ref 0.3–1.2)
BUN: 25 mg/dL — ABNORMAL HIGH (ref 6–20)
CALCIUM: 9.1 mg/dL (ref 8.9–10.3)
CO2: 27 mmol/L (ref 22–32)
Chloride: 100 mmol/L — ABNORMAL LOW (ref 101–111)
Creatinine, Ser: 0.77 mg/dL (ref 0.44–1.00)
GFR calc non Af Amer: >60 mL/min (ref >60)
GLUCOSE: 101 mg/dL — AB (ref 65–99)
POTASSIUM: 3.3 mmol/L — AB (ref 3.5–5.1)
Sodium: 137 mmol/L (ref 135–145)
TOTAL PROTEIN: 6.9 g/dL (ref 6.5–8.1)

## 2017-12-22 LAB — URINALYSIS, ROUTINE W REFLEX MICROSCOPIC
Glucose, UA: NEGATIVE mg/dL
KETONES UR: NEGATIVE mg/dL
Leukocytes, UA: NEGATIVE
Nitrite: NEGATIVE
PH: 5 (ref 5.0–8.0)
PROTEIN: 100 mg/dL — AB
Specific Gravity, Urine: 1.029 (ref 1.005–1.030)

## 2017-12-22 LAB — CBC WITH DIFFERENTIAL/PLATELET
BASOS ABS: 0 10*3/uL (ref 0.0–0.1)
BASOS PCT: 0 %
EOS ABS: 0 10*3/uL (ref 0.0–0.7)
Eosinophils Relative: 0 %
HCT: 36 % (ref 36.0–46.0)
HEMOGLOBIN: 11.4 g/dL — AB (ref 12.0–15.0)
LYMPHS PCT: 11 %
Lymphs Abs: 1.3 10*3/uL (ref 0.7–4.0)
MCH: 27.4 pg (ref 26.0–34.0)
MCHC: 31.7 g/dL (ref 30.0–36.0)
MCV: 86.5 fL (ref 78.0–100.0)
MONO ABS: 1.1 10*3/uL — AB (ref 0.1–1.0)
Monocytes Relative: 9 %
NEUTROS PCT: 80 %
Neutro Abs: 9.8 10*3/uL — ABNORMAL HIGH (ref 1.7–7.7)
PLATELETS: 274 10*3/uL (ref 150–400)
RBC: 4.16 MIL/uL (ref 3.87–5.11)
RDW: 13.6 % (ref 11.5–15.5)
WBC: 12.2 10*3/uL — AB (ref 4.0–10.5)

## 2017-12-22 LAB — LIPASE, BLOOD: Lipase: 19 U/L (ref 11–51)

## 2017-12-22 LAB — RAPID URINE DRUG SCREEN, HOSP PERFORMED
AMPHETAMINES: NOT DETECTED
BENZODIAZEPINES: NOT DETECTED
Barbiturates: NOT DETECTED
COCAINE: NOT DETECTED
OPIATES: POSITIVE — AB
Tetrahydrocannabinol: NOT DETECTED

## 2017-12-22 LAB — ETHANOL

## 2017-12-22 LAB — MAGNESIUM: Magnesium: 2.1 mg/dL (ref 1.7–2.4)

## 2017-12-22 MED ORDER — SODIUM CHLORIDE 0.9 % IV SOLN
INTRAVENOUS | Status: DC
  Administered 2017-12-22: 14:00:00 via INTRAVENOUS

## 2017-12-22 MED ORDER — LORATADINE 10 MG PO TABS
10.0000 mg | ORAL_TABLET | Freq: Every day | ORAL | Status: DC
Start: 2017-12-23 — End: 2017-12-23
  Filled 2017-12-22: qty 1

## 2017-12-22 MED ORDER — POTASSIUM CHLORIDE IN NACL 20-0.9 MEQ/L-% IV SOLN
INTRAVENOUS | Status: AC
  Administered 2017-12-22: 23:00:00 via INTRAVENOUS

## 2017-12-22 MED ORDER — CIPROFLOXACIN IN D5W 400 MG/200ML IV SOLN
400.0000 mg | Freq: Once | INTRAVENOUS | Status: AC
  Administered 2017-12-22: 400 mg via INTRAVENOUS
  Filled 2017-12-22: qty 200

## 2017-12-22 MED ORDER — METHOCARBAMOL 500 MG PO TABS: 500.0000 mg | ORAL_TABLET | Freq: Four times a day (QID) | ORAL | Status: DC | PRN

## 2017-12-22 MED ORDER — MORPHINE SULFATE (PF) 2 MG/ML IV SOLN
0.5000 mg | INTRAVENOUS | Status: DC | PRN
  Administered 2017-12-23: 0.5 mg via INTRAVENOUS
  Filled 2017-12-22: qty 1

## 2017-12-22 MED ORDER — CIPROFLOXACIN HCL 250 MG PO TABS
500.0000 mg | ORAL_TABLET | Freq: Two times a day (BID) | ORAL | Status: DC
  Administered 2017-12-23 – 2017-12-24 (×3): 500 mg via ORAL
  Filled 2017-12-22 (×4): qty 2

## 2017-12-22 MED ORDER — MORPHINE SULFATE (PF) 2 MG/ML IV SOLN
1.0000 mg | Freq: Once | INTRAVENOUS | Status: AC
  Administered 2017-12-22: 1 mg via INTRAVENOUS
  Filled 2017-12-22: qty 1

## 2017-12-22 MED ORDER — ONDANSETRON HCL 4 MG/2ML IJ SOLN
4.0000 mg | Freq: Once | INTRAMUSCULAR | Status: AC
  Administered 2017-12-22: 4 mg via INTRAVENOUS
  Filled 2017-12-22: qty 2

## 2017-12-22 MED ORDER — ACETAMINOPHEN 650 MG RE SUPP: 650.0000 mg | Freq: Four times a day (QID) | RECTAL | Status: DC | PRN

## 2017-12-22 MED ORDER — ACETAMINOPHEN 325 MG PO TABS: 650.0000 mg | ORAL_TABLET | Freq: Four times a day (QID) | ORAL | Status: DC | PRN

## 2017-12-22 NOTE — ED Notes (Signed)
sz pads placed on ED stretcher.

## 2017-12-22 NOTE — ED Notes (Signed)
Patient to Radiology

## 2017-12-22 NOTE — ED Notes (Signed)
Patient to radiology.

## 2017-12-22 NOTE — ED Notes (Signed)
Patient to MRI.

## 2017-12-22 NOTE — Progress Notes (Signed)
Pharmacy Antibiotic Note  Jenna Smith is a 67 y.o. female admitted on 12/22/2017 with UTI.  Pharmacy has been consulted for Cipro dosing.  Plan: Cipro 400mg  IV x 1, then 500mg  PO BID Dose stable for age, weight, renal function and indication. No pharmacokinetic monitoring needed. Sign off.   Height: 5\' 6"  (167.6 cm) Weight: 170 lb (77.1 kg) IBW/kg (Calculated) : 59.3  Temp (24hrs), Avg:97.4 F (36.3 C), Min:97.4 F (36.3 C), Max:97.4 F (36.3 C)  Recent Labs  Lab 12/22/17 1326  WBC 12.2*  CREATININE 0.77    Estimated Creatinine Clearance: 74.5 mL/min (by C-G formula based on SCr of 0.77 mg/dL).    Allergies  Allergen Reactions  . Indomethacin Swelling    Face swells up  . Latex Hives  . Neosporin [Neomycin-Bacitracin Zn-Polymyx] Hives  . Penicillins Hives    Has patient had a PCN reaction causing immediate rash, facial/tongue/throat swelling, SOB or lightheadedness with hypotension: no Has patient had a PCN reaction causing severe rash involving mucus membranes or skin necrosis: no Has patient had a PCN reaction that required hospitalization: no Has patient had a PCN reaction occurring within the last 10 years: no If all of the above answers are "NO", then may proceed with Cephalosporin use.     Antimicrobials this admission: Cipro 2/25 >>    >>   Dose adjustments this admission: n/a   Microbiology results:  BCx:  2/28 UCx: pending   Sputum:    MRSA PCR:   Thank you for allowing pharmacy to be a part of this patient's care.  Pricilla Larsson 12/22/2017 7:13 PM

## 2017-12-22 NOTE — ED Triage Notes (Signed)
Witnessed syncopal episode/ seizure activity at [place] today.  Per employee at [facility] pt hit head when she fell.  Then had seizure activity.  CBG 72.  Pt postictal  with EMS.  Alert upon arrival to the ED.  Pt has no seizure history.

## 2017-12-22 NOTE — ED Provider Notes (Signed)
[facility] EMERGENCY DEPARTMENT Provider Note   CSN: [id number] Arrival date & time: 12/22/17  1046     History   Chief Complaint Chief Complaint  Patient presents with  . Seizures    HPI Verdis A Leanos is a 67 y.o. female.  Patient brought in by EMS.  Patient was at [facility] had what was probably a syncopal episode and may be some seizure activity.  Patient collapsed at the right side of her head.  Bystanders said that after the fall there was seizure activity.  EMS felt she was postictal.  Blood sugar was 72.  Patient alert and normal upon arrival to the emergency department.  Patient status post right knee replacement surgery 4 weeks ago.  Has been also on steroids for bronchitis not sleeping well and is on pain medicine as well.  Patient feels that she was tired and may have just passed out.  No known history of seizures.  Patient's only complaint is pain to the right side of her head.  And she is also worried that she injured her knee replacement.  Patient states the last thing she remembers is that her vision was getting blurry and then the next thing she remembers waking up on the floor.  She denies any incontinence she did not bite her tongue.      Past Medical History:  Diagnosis Date  . Allergy   . Anemia 12/22/2017    Patient Active Problem List   Diagnosis Date Noted  . Syncope 12/22/2017  . Hypokalemia 12/22/2017  . Anemia 12/22/2017  . 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 Tendonitis of knee, right 06/11/2017  . Obesity (BMI 30-39.9) 06/21/2016  . Allergic rhinitis 02/26/2016  . Right knee pain 02/26/2016    Past Surgical History:  Procedure Laterality Date  . BREAST SURGERY Bilateral May 2001   Breast reduction  . COSMETIC SURGERY  September 2012   Face lift  . TONSILLECTOMY    . 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   Procedure: RIGHT TOTAL KNEE ARTHROPLASTY;  Surgeon: [name], MD;  Location: [facility] ORS;  Service: Orthopedics;   Laterality: Right;    OB History    No data available       Home Medications    Prior to Admission medications   Medication Sig Start Date End Date Taking? Authorizing Provider  aspirin EC 325 MG tablet Take 1 tablet (325 mg total) by mouth 2 (two) times daily. 11/20/17  Yes Constable, Amber, PA-C  cetirizine (ZYRTEC) 10 MG tablet Take 10 mg by mouth daily as needed for allergies.    Yes [provider]  HYDROcodone-acetaminophen (NORCO/VICODIN) 5-325 MG tablet Take 1-2 tablets by mouth every 4 (four) hours as needed for moderate pain ((score 4 to 6)). 11/20/17  Yes Constable, Amber, PA-C  HYDROcodone-homatropine (HYCODAN) 5-1.5 MG/5ML syrup Take 5 mLs by mouth every 8 (eight) hours as needed for cough. 12/18/17  Yes Hawks, Christy A, FNP  methocarbamol (ROBAXIN) 500 MG tablet Take 1 tablet (500 mg total) by mouth every 6 (six) hours as needed for muscle spasms. 11/20/17  Yes Constable, Amber, PA-C  predniSONE (STERAPRED UNI-PAK 21 TAB) 5 MG (21) TBPK tablet Use as directed 12/18/17  Yes Hawks, Theador Hawthorne, FNP    Family History Family History  Problem Relation Age of Onset  . Aneurysm Mother        passed away at age 83  . Cancer Father        passed away  after Prostate cancer metastesize  . Cancer Sister        she passed away 68 due to Breast Cancer  . Stroke Sister   . Aneurysm Sister     Social History Social History   Tobacco Use  . Smoking status: Former Smoker    Packs/day: 0.30    Years: 15.00    Pack years: 4.50    Types: Cigarettes    Last attempt to quit: 10/28/1984    Years since quitting: 33.1  . Smokeless tobacco: Never Used  . Tobacco comment: smoked "off and on" x 15 yrs  Substance Use Topics  . Alcohol use: No    Alcohol/week: 0.0 oz    Comment: Holidays - occasional wine  . Drug use: No     Allergies   Indomethacin; Latex; Neosporin [neomycin-bacitracin zn-polymyx]; and Penicillins   Review of Systems Review of Systems  Constitutional:  Negative for fever.  HENT: Negative for congestion.   Eyes: Positive for visual disturbance.  Respiratory: Negative for shortness of breath.   Cardiovascular: Negative for chest pain.  Gastrointestinal: Negative for abdominal pain.  Genitourinary: Negative for dysuria.  Musculoskeletal: Positive for joint swelling. Negative for back pain and neck pain.  Neurological: Positive for seizures, syncope and headaches.  Hematological: Does not bruise/bleed easily.  Psychiatric/Behavioral: Negative for confusion.     Physical Exam Updated Vital Signs BP (!) 116/55   Pulse 67   Temp (!) 97.4 F (36.3 C) (Oral)   Resp (!) 9   Ht 1.676 m (5\' 6" )   Wt 77.1 kg (170 lb)   SpO2 100%   BMI 27.44 kg/m   Physical Exam  Constitutional: She is oriented to person, place, and time. She appears well-developed and well-nourished. No distress.  HENT:  Head: Normocephalic.  Mouth/Throat: Oropharynx is clear and moist.  Swelling and tenderness to the right side of the head.  No step-offs.  No active bleeding.  Eyes: Conjunctivae and EOM are normal. Pupils are equal, round, and reactive to light.  Neck: Normal range of motion. Neck supple.  No posterior cervical tenderness to palpation.  Good range of motion.  Cardiovascular: Normal rate, regular rhythm and normal heart sounds.  Pulmonary/Chest: Effort normal and breath sounds normal. No respiratory distress.  Abdominal: Soft. Bowel sounds are normal. There is no tenderness.  Musculoskeletal: Normal range of motion. She exhibits edema.  Surgical wound to right anterior knee.  No significant redness.  No obvious deformity.  Neurological: She is alert and oriented to person, place, and time. No cranial nerve deficit or sensory deficit. She exhibits normal muscle tone. Coordination normal.  Skin: Skin is warm. Capillary refill takes less than 2 seconds.  Nursing note and vitals reviewed.    ED Treatments / Results  Labs (all labs ordered are  listed, but only abnormal results are displayed) Labs Reviewed  COMPREHENSIVE METABOLIC PANEL - Abnormal; Notable for the following components:      Result Value   Potassium 3.3 (*)    Chloride 100 (*)    Glucose, Bld 101 (*)    BUN 25 (*)    AST 14 (*)    ALT 13 (*)    Total Bilirubin 0.2 (*)    All other components within normal limits  CBC WITH DIFFERENTIAL/PLATELET - Abnormal; Notable for the following components:   WBC 12.2 (*)    Hemoglobin 11.4 (*)    Neutro Abs 9.8 (*)    Monocytes Absolute 1.1 (*)    All  other components within normal limits  RAPID URINE DRUG SCREEN, HOSP PERFORMED - Abnormal; Notable for the following components:   Opiates POSITIVE (*)    All other components within normal limits  URINALYSIS, ROUTINE W REFLEX MICROSCOPIC - Abnormal; Notable for the following components:   APPearance CLOUDY (*)    Hgb urine dipstick MODERATE (*)    Bilirubin Urine SMALL (*)    Protein, ur 100 (*)    Bacteria, UA RARE (*)    Squamous Epithelial / LPF 0-5 (*)    All other components within normal limits  LIPASE, BLOOD  ETHANOL  MAGNESIUM    EKG  EKG Interpretation  Date/Time:  Monday December 22 2017 10:50:33 EST Ventricular Rate:  82 PR Interval:    QRS Duration: 101 QT Interval:  409 QTC Calculation: 478 R Axis:   70 Text Interpretation:  Sinus rhythm Low voltage, precordial leads Minimal ST elevation, inferior leads Confirmed by Fredia Sorrow [phone]) on 12/22/2017 1:24:15 PM       Radiology Dg Chest 2 View  Result Date: 12/22/2017 CLINICAL DATA:  Syncope. EXAM: CHEST  2 VIEW COMPARISON:  Radiographs of November 12, 2017. FINDINGS: The heart size and mediastinal contours are within normal limits. Both lungs are clear. No pneumothorax or pleural effusion is noted. The visualized skeletal structures are unremarkable. IMPRESSION: No active cardiopulmonary disease. Electronically Signed   By: Marijo Conception, M.D.   On: 12/22/2017 13:58   Ct Head Wo  Contrast  Addendum Date: 12/22/2017   ADDENDUM REPORT: 12/22/2017 15:54 ADDENDUM: Study discussed by telephone with Dr. Fredia Sorrow on 12/22/2017 at 1543 hrs. Electronically Signed   By: Genevie Ann M.D.   On: 12/22/2017 15:54   Result Date: 12/22/2017 CLINICAL DATA:  67 year old female with witnessed syncope, fall with head injury and subsequent seizure activity of [employer] today. EXAM: CT HEAD WITHOUT CONTRAST TECHNIQUE: Contiguous axial images were obtained from the base of the skull through the vertex without intravenous contrast. COMPARISON:  Head CT without contrast 01/16/2016. FINDINGS: Brain: There is a curvilinear area of abnormal hyperdensity along the left anterior operculum subcortical white matter, new since 2017 and best demonstrated on series 4, image 27. No surrounding edema. No regional mass effect. The overlying cortex appears to remain normal. No other intracranial hemorrhage identified. No extra-axial blood or collection. No intracranial mass effect. No midline shift. Bilateral gray-white matter differentiation remains normal. No acute cortically based infarct. No ventriculomegaly. Vascular: Dominant distal left vertebral artery. No suspicious intracranial vascular hyperdensity. Skull: Subtle nondisplaced linear right lateral skull fracture best seen on series 5, image 7. Stable skull elsewhere with no other acute osseous abnormality identified. Sinuses/Orbits: Clear. Resolved sinus bubbly opacity seen in 2017. Tympanic cavities and mastoids remain clear. Other: Broad-based right superior convexity scalp hematoma measuring up to 7 millimeters in thickness. Suspicion of underlying subtle nondisplaced skull fracture (series 3, image 47 and sagittal image 7). Other scalp soft tissues, visible orbits soft tissues and visible deep soft tissue spaces of the face appear normal. IMPRESSION: 1. Positive for combination nondisplaced right lateral skull fracture, and small curvilinear acute hemorrhage in  the contralateral left hemisphere subcortical white matter. Perhaps this represents an unusual Coup-Contrecoup injury with white matter shear hemorrhage, but consider also an acute hemorrhagic lacunar infarct. Follow-up brain MRI (noncontrast should suffice) may be valuable. 2. Subtle nondisplaced right lateral temporal/parietal skull fracture with overlying broad-based scalp hematoma. Electronically Signed: By: Genevie Ann M.D. On: 12/22/2017 15:37   Mr Hal Neer  Contrast  Result Date: 12/22/2017 CLINICAL DATA:  Altered mental status. Fall with intracranial hemorrhage EXAM: MRI HEAD WITHOUT CONTRAST MRA HEAD WITHOUT CONTRAST TECHNIQUE: Multiplanar, multiecho pulse sequences of the brain and surrounding structures were obtained without intravenous contrast. Angiographic images of the head were obtained using MRA technique without contrast. COMPARISON:  None. Head CT 12/22/2017 FINDINGS: MRI HEAD FINDINGS Brain: The midline structures are normal. Crescentic focus of hemorrhage within the subcortical left frontal operculum white matter. No evidence of acute infarct. The brain parenchymal signal is normal. No mass lesion. No chronic microhemorrhage or cerebral amyloid angiopathy. No hydrocephalus, age advanced atrophy or lobar predominant volume loss. No dural abnormality or extra-axial collection. Skull and upper cervical spine: The visualized skull base, calvarium, upper cervical spine and extracranial soft tissues are normal. No marrow edema. Sinuses/Orbits: No fluid levels or advanced mucosal thickening. No mastoid effusion. Normal orbits. MRA HEAD FINDINGS Intracranial internal carotid arteries: Normal. Anterior cerebral arteries: Normal. Middle cerebral arteries: Normal. Posterior communicating arteries: Present bilaterally. Posterior cerebral arteries: Normal. Basilar artery: Normal. Vertebral arteries: Left dominant. Normal. Superior cerebellar arteries: Normal. Inferior cerebellar arteries are poorly  visualized. IMPRESSION: 1. Unchanged size of intraparenchymal hemorrhage in the left frontal operculum white matter. No new mass effect or new site of hemorrhage. 2. No emergent large vessel occlusion.  No aneurysm or AVM. Electronically Signed   By: Ulyses Jarred M.D.   On: 12/22/2017 17:17   Mr Brain Wo Contrast (neuro Protocol)  Result Date: 12/22/2017 CLINICAL DATA:  Altered mental status. Fall with intracranial hemorrhage EXAM: MRI HEAD WITHOUT CONTRAST MRA HEAD WITHOUT CONTRAST TECHNIQUE: Multiplanar, multiecho pulse sequences of the brain and surrounding structures were obtained without intravenous contrast. Angiographic images of the head were obtained using MRA technique without contrast. COMPARISON:  None. Head CT 12/22/2017 FINDINGS: MRI HEAD FINDINGS Brain: The midline structures are normal. Crescentic focus of hemorrhage within the subcortical left frontal operculum white matter. No evidence of acute infarct. The brain parenchymal signal is normal. No mass lesion. No chronic microhemorrhage or cerebral amyloid angiopathy. No hydrocephalus, age advanced atrophy or lobar predominant volume loss. No dural abnormality or extra-axial collection. Skull and upper cervical spine: The visualized skull base, calvarium, upper cervical spine and extracranial soft tissues are normal. No marrow edema. Sinuses/Orbits: No fluid levels or advanced mucosal thickening. No mastoid effusion. Normal orbits. MRA HEAD FINDINGS Intracranial internal carotid arteries: Normal. Anterior cerebral arteries: Normal. Middle cerebral arteries: Normal. Posterior communicating arteries: Present bilaterally. Posterior cerebral arteries: Normal. Basilar artery: Normal. Vertebral arteries: Left dominant. Normal. Superior cerebellar arteries: Normal. Inferior cerebellar arteries are poorly visualized. IMPRESSION: 1. Unchanged size of intraparenchymal hemorrhage in the left frontal operculum white matter. No new mass effect or new site of  hemorrhage. 2. No emergent large vessel occlusion.  No aneurysm or AVM. Electronically Signed   By: Ulyses Jarred M.D.   On: 12/22/2017 17:17   Dg Knee Complete 4 Views Right  Result Date: 12/22/2017 CLINICAL DATA:  Right knee pain after fall at [employer] EXAM: RIGHT KNEE - COMPLETE 4+ VIEW COMPARISON:  07/17/2017 FINDINGS: Previous right total knee arthroplasty. No evidence of fracture, dislocation, or joint effusion. No evidence of arthropathy or other focal bone abnormality. Soft tissues are unremarkable. IMPRESSION: 1. No acute findings. 2. Prior right total knee arthroplasty. Electronically Signed   By: Kerby Moors M.D.   On: 12/22/2017 15:24    Procedures Procedures (including critical care time)  Medications Ordered in ED Medications  0.9 %  sodium chloride  infusion ( Intravenous New Bag/Given 12/22/17 1357)  ondansetron (ZOFRAN) injection 4 mg (4 mg Intravenous Given 12/22/17 1724)     Initial Impression / Assessment and Plan / ED Course  I have reviewed the triage vital signs and the nursing notes.  Pertinent labs & imaging results that were available during my care of the patient were reviewed by me and considered in my medical decision making (see chart for details).    Patient's head CT showed evidence of a right parietal temporal area skull fracture.  On the left side there is evidence of a intraparenchymal hemorrhage with possible contrecoup type injury.  Recommended MRI so MRI was done.  MRI confirmed that is most likely a contusion on that side.  Discussed with neurosurgery on-call, Dr. Vernice Jefferson, and he says patient is safe for admission here recommends head CT in the morning if it showing worsening condition to contact him.  Discussed with hospitalist Dr. Maudie Mercury.  He will admit here.  Lab workup without significant abnormalities.  Patient requires admission for unexplained syncopal episode.  Although could be related to her pain medicine lack of sleep.  Not clear whether  there was seizure activity or not.  Is possible this could be related to the syncopal episode and hitting her head.  Will be admitted for cardiac monitoring.  X-rays of her knee replacement without any acute findings.  Patient without any low back pain upper back pain or any neck pain.  Patient alert no neuro deficits.   Final Clinical Impressions(s) / ED Diagnoses   Final diagnoses:  Syncope, unspecified syncope type  Seizure-like activity ([place])  Closed fracture of parietal bone, initial encounter ([place])  Contusion of brain with loss of consciousness of 30 minutes or less, initial encounter [facility])    ED Discharge Orders    None       Fredia Sorrow, MD 12/22/17 1924

## 2017-12-22 NOTE — ED Notes (Signed)
Patient given IV zofran.

## 2017-12-22 NOTE — H&P (Signed)
TRH H&P   Patient Demographics:    Jenna Smith, is a 67 y.o. female  MRN: [id number]   DOB - [birth year]  Admit Date - 12/22/2017  Outpatient Primary MD for the patient is Raliegh Ip, DO  Referring MD/NP/PA: Idalia Needle  Outpatient Specialists:   Patient coming from:  [facility]  Chief Complaint  Patient presents with  . Seizures      HPI:    Jenna Smith  is a 67 y.o. female, w recent surgery for RTKA apparently was at [facility] and had a syncopal episode,  ?witnessess saw her twitching according to ED physician after hitting her head  However no tongue lac or incontinence. Pt was brought to ED for evaluation.   In ED,  CXR IMPRESSION: No active cardiopulmonary disease.  R knee IMPRESSION: 1. No acute findings. 2. Prior right total knee arthroplasty.  CT brain IMPRESSION: 1. Positive for combination nondisplaced right lateral skull fracture, and small curvilinear acute hemorrhage in the contralateral left hemisphere subcortical white matter. Perhaps this represents an unusual Coup-Contrecoup injury with white matter shear hemorrhage, but consider also an acute hemorrhagic lacunar infarct. Follow-up brain MRI (noncontrast should suffice) may be valuable. 2. Subtle nondisplaced right lateral temporal/parietal skull fracture with overlying broad-based scalp hematoma.  MRI/ MRA brain IMPRESSION: 1. Unchanged size of intraparenchymal hemorrhage in the left frontal operculum white matter. No new mass effect or new site of hemorrhage. 2. No emergent large vessel occlusion.  No aneurysm or AVM.   Neurosurgery Dutch Quint apparently consulted by ED, and said that surgery not warranted and would repeat CT brain tomorrow, please call if any change/ worsening but otherwise can f/u as outpatient   Na 137, K 3.3,  Bun 25, Creatinine 0.77 Ast 14,  Alt 13 Wbc 12.2, Hgb 11.4, Plt 274  UDS opiate positive Urinalysis Wbc 6-30, Rbc 6-30  Pt will be admitted for syncope and uti, and hypokalemia    Review of systems:    In addition to the HPI above, No Fever-chills, No changes with Vision or hearing, No problems swallowing food or Liquids, No Chest pain, Cough or Shortness of Breath, No Abdominal pain, No Nausea or Vommitting, Bowel movements are regular, No Blood in stool or Urine, No dysuria, No new skin rashes or bruises, No new joints pains-aches,  No new weakness, tingling, numbness in any extremity, No recent weight gain or loss, No polyuria, polydypsia or polyphagia, No significant Mental Stressors.  A full 10 point Review of Systems was done, except as stated above, all other Review of Systems were negative.   With Past History of the following :    Past Medical History:  Diagnosis Date  . Allergy   . Anemia 12/22/2017      Past Surgical History:  Procedure Laterality Date  . BREAST SURGERY Bilateral May 2001   Breast reduction  . COSMETIC SURGERY  September  2012   Face lift  . TONSILLECTOMY    . 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 Smoking status: Former Smoker    Packs/day: 0.30    Years: 15.00    Pack years: 4.50    Types: Cigarettes    Last attempt to quit: 10/28/1984    Years since quitting: 33.1  . Smokeless tobacco: Never Used  . Tobacco comment: smoked "off and on" x 15 yrs  Substance Use Topics  . Alcohol use: No    Alcohol/week: 0.0 oz    Comment: Holidays - occasional wine     Lives - at home  Mobility - walks by self     Family History :     Family History  Problem Relation Age of Onset  . Aneurysm Mother        passed away at age 69  . Cancer Father        passed away  after Prostate cancer metastesize  . Cancer Sister        she passed away 70 due to Breast Cancer  . Stroke Sister   . Aneurysm Sister       Home Medications:   Prior to Admission medications   Medication Sig Start Date End Date Taking? Authorizing Provider  aspirin EC 325 MG tablet Take 1 tablet (325 mg total) by mouth 2 (two) times daily. 11/20/17  Yes Constable, Amber, PA-C  cetirizine (ZYRTEC) 10 MG tablet Take 10 mg by mouth daily as needed for allergies.    Yes [provider]  HYDROcodone-acetaminophen (NORCO/VICODIN) 5-325 MG tablet Take 1-2 tablets by mouth every 4 (four) hours as needed for moderate pain ((score 4 to 6)). 11/20/17  Yes Constable, Amber, PA-C  HYDROcodone-homatropine (HYCODAN) 5-1.5 MG/5ML syrup Take 5 mLs by mouth every 8 (eight) hours as needed for cough. 12/18/17  Yes Hawks, Christy A, FNP  methocarbamol (ROBAXIN) 500 MG tablet Take 1 tablet (500 mg total) by mouth every 6 (six) hours as needed for muscle spasms. 11/20/17  Yes Constable, Amber, PA-C  predniSONE (STERAPRED UNI-PAK 21 TAB) 5 MG (21) TBPK tablet Use as directed 12/18/17  Yes Hawks, Edilia Bo, FNP     Allergies:     Allergies  Allergen Reactions  . Indomethacin Swelling    Face swells up  . Latex Hives  . Neosporin [Neomycin-Bacitracin Zn-Polymyx] Hives  . Penicillins Hives    Has patient had a PCN reaction causing immediate rash, facial/tongue/throat swelling, SOB or lightheadedness with hypotension: no Has patient had a PCN reaction causing severe rash involving mucus membranes or skin necrosis: no Has patient had a PCN reaction that required hospitalization: no Has patient had a PCN reaction occurring within the last 10 years: no If all of the above answers are "NO", then may proceed with Cephalosporin use.      Physical Exam:   Vitals  Blood pressure (!) 116/55, pulse 67, temperature (!) 97.4 F (36.3 C), temperature source Oral, resp. rate (!) 9, height 5\' 6"  (1.676 m),  weight 77.1 kg (170 lb), SpO2 100 %.   1. General  lying in bed in NAD,    2. Normal affect and insight, Not Suicidal or Homicidal, Awake Alert, Oriented X 3.  3. No F.N deficits, ALL C.Nerves Intact,  Strength 5/5 all 4 extremities, Sensation intact all 4 extremities, Plantars down going.  4. Ears and Eyes appear Normal, Conjunctivae clear, PERRLA. Moist Oral Mucosa.  5. Supple Neck, No JVD, No cervical lymphadenopathy appriciated, No Carotid Bruits.  6. Symmetrical Chest wall movement, Good air movement bilaterally, CTAB.  7. RRR, No Gallops, Rubs or Murmurs, No Parasternal Heave.  8. Positive Bowel Sounds, Abdomen Soft, No tenderness, No organomegaly appriciated,No rebound -guarding or rigidity.  9.  No Cyanosis, Normal Skin Turgor, No Skin Rash or Bruise.  10. Good muscle tone,  joints appear normal , no effusions, Normal ROM.  11. No Palpable Lymph Nodes in Neck or Axillae  Slight bump over the right posterior skull, no bleeding   Data Review:    CBC Recent Labs  Lab 12/22/17 1326  WBC 12.2*  HGB 11.4*  HCT 36.0  PLT 274  MCV 86.5  MCH 27.4  MCHC 31.7  RDW 13.6  LYMPHSABS 1.3  MONOABS 1.1*  EOSABS 0.0  BASOSABS 0.0   ------------------------------------------------------------------------------------------------------------------  Chemistries  Recent Labs  Lab 12/22/17 1326  NA 137  K 3.3*  CL 100*  CO2 27  GLUCOSE 101*  BUN 25*  CREATININE 0.77  CALCIUM 9.1  MG 2.1  AST 14*  ALT 13*  ALKPHOS 114  BILITOT 0.2*   ------------------------------------------------------------------------------------------------------------------ estimated creatinine clearance is 74.5 mL/min (by C-G formula based on SCr of 0.77 mg/dL). ------------------------------------------------------------------------------------------------------------------ No results for input(s): TSH, T4TOTAL, T3FREE, THYROIDAB in the last 72 hours.  Invalid input(s):  FREET3  Coagulation profile No results for input(s): INR, PROTIME in the last 168 hours. ------------------------------------------------------------------------------------------------------------------- No results for input(s): DDIMER in the last 72 hours. -------------------------------------------------------------------------------------------------------------------  Cardiac Enzymes No results for input(s): CKMB, TROPONINI, MYOGLOBIN in the last 168 hours.  Invalid input(s): CK ------------------------------------------------------------------------------------------------------------------ No results found for: BNP   ---------------------------------------------------------------------------------------------------------------  Urinalysis    Component Value Date/Time   COLORURINE YELLOW 12/22/2017 1430   APPEARANCEUR CLOUDY (A) 12/22/2017 1430   LABSPEC 1.029 12/22/2017 1430   PHURINE 5.0 12/22/2017 1430   GLUCOSEU NEGATIVE 12/22/2017 1430   HGBUR MODERATE (A) 12/22/2017 1430   BILIRUBINUR SMALL (A) 12/22/2017 1430   KETONESUR NEGATIVE 12/22/2017 1430   PROTEINUR 100 (A) 12/22/2017 1430   NITRITE NEGATIVE 12/22/2017 1430   LEUKOCYTESUR NEGATIVE 12/22/2017 1430    ----------------------------------------------------------------------------------------------------------------   Imaging Results:    Dg Chest 2 View  Result Date: 12/22/2017 CLINICAL DATA:  Syncope. EXAM: CHEST  2 VIEW COMPARISON:  Radiographs of November 12, 2017. FINDINGS: The heart size and mediastinal contours are within normal limits. Both lungs are clear. No pneumothorax or pleural effusion is noted. The visualized skeletal structures are unremarkable. IMPRESSION: No active cardiopulmonary disease. Electronically Signed   By: Lupita Raider, M.D.   On: 12/22/2017 13:58   Ct Head Wo Contrast  Addendum Date: 12/22/2017   ADDENDUM REPORT: 12/22/2017 15:54 ADDENDUM: Study discussed by telephone with Dr.  Vanetta Mulders on 12/22/2017 at 1543 hrs. Electronically Signed   By: Odessa Fleming M.D.   On: 12/22/2017 15:54   Result Date: 12/22/2017 CLINICAL DATA:  67 year old female with witnessed syncope, fall with head injury and subsequent seizure activity of [employer] today. EXAM: CT HEAD WITHOUT CONTRAST TECHNIQUE: Contiguous axial images were obtained from the base of the skull through the vertex without intravenous contrast. COMPARISON:  Head CT without contrast 01/16/2016. FINDINGS: Brain: There is a curvilinear area of abnormal hyperdensity along the left anterior operculum subcortical white matter, new since 2017 and best demonstrated  on series 4, image 27. No surrounding edema. No regional mass effect. The overlying cortex appears to remain normal. No other intracranial hemorrhage identified. No extra-axial blood or collection. No intracranial mass effect. No midline shift. Bilateral gray-white matter differentiation remains normal. No acute cortically based infarct. No ventriculomegaly. Vascular: Dominant distal left vertebral artery. No suspicious intracranial vascular hyperdensity. Skull: Subtle nondisplaced linear right lateral skull fracture best seen on series 5, image 7. Stable skull elsewhere with no other acute osseous abnormality identified. Sinuses/Orbits: Clear. Resolved sinus bubbly opacity seen in 2017. Tympanic cavities and mastoids remain clear. Other: Broad-based right superior convexity scalp hematoma measuring up to 7 millimeters in thickness. Suspicion of underlying subtle nondisplaced skull fracture (series 3, image 47 and sagittal image 7). Other scalp soft tissues, visible orbits soft tissues and visible deep soft tissue spaces of the face appear normal. IMPRESSION: 1. Positive for combination nondisplaced right lateral skull fracture, and small curvilinear acute hemorrhage in the contralateral left hemisphere subcortical white matter. Perhaps this represents an unusual Coup-Contrecoup injury  with white matter shear hemorrhage, but consider also an acute hemorrhagic lacunar infarct. Follow-up brain MRI (noncontrast should suffice) may be valuable. 2. Subtle nondisplaced right lateral temporal/parietal skull fracture with overlying broad-based scalp hematoma. Electronically Signed: By: Odessa Fleming M.D. On: 12/22/2017 15:37   Mr Maxine Glenn Head Wo Contrast  Result Date: 12/22/2017 CLINICAL DATA:  Altered mental status. Fall with intracranial hemorrhage EXAM: MRI HEAD WITHOUT CONTRAST MRA HEAD WITHOUT CONTRAST TECHNIQUE: Multiplanar, multiecho pulse sequences of the brain and surrounding structures were obtained without intravenous contrast. Angiographic images of the head were obtained using MRA technique without contrast. COMPARISON:  None. Head CT 12/22/2017 FINDINGS: MRI HEAD FINDINGS Brain: The midline structures are normal. Crescentic focus of hemorrhage within the subcortical left frontal operculum white matter. No evidence of acute infarct. The brain parenchymal signal is normal. No mass lesion. No chronic microhemorrhage or cerebral amyloid angiopathy. No hydrocephalus, age advanced atrophy or lobar predominant volume loss. No dural abnormality or extra-axial collection. Skull and upper cervical spine: The visualized skull base, calvarium, upper cervical spine and extracranial soft tissues are normal. No marrow edema. Sinuses/Orbits: No fluid levels or advanced mucosal thickening. No mastoid effusion. Normal orbits. MRA HEAD FINDINGS Intracranial internal carotid arteries: Normal. Anterior cerebral arteries: Normal. Middle cerebral arteries: Normal. Posterior communicating arteries: Present bilaterally. Posterior cerebral arteries: Normal. Basilar artery: Normal. Vertebral arteries: Left dominant. Normal. Superior cerebellar arteries: Normal. Inferior cerebellar arteries are poorly visualized. IMPRESSION: 1. Unchanged size of intraparenchymal hemorrhage in the left frontal operculum white matter. No new  mass effect or new site of hemorrhage. 2. No emergent large vessel occlusion.  No aneurysm or AVM. Electronically Signed   By: Deatra Robinson M.D.   On: 12/22/2017 17:17   Mr Brain Wo Contrast (neuro Protocol)  Result Date: 12/22/2017 CLINICAL DATA:  Altered mental status. Fall with intracranial hemorrhage EXAM: MRI HEAD WITHOUT CONTRAST MRA HEAD WITHOUT CONTRAST TECHNIQUE: Multiplanar, multiecho pulse sequences of the brain and surrounding structures were obtained without intravenous contrast. Angiographic images of the head were obtained using MRA technique without contrast. COMPARISON:  None. Head CT 12/22/2017 FINDINGS: MRI HEAD FINDINGS Brain: The midline structures are normal. Crescentic focus of hemorrhage within the subcortical left frontal operculum white matter. No evidence of acute infarct. The brain parenchymal signal is normal. No mass lesion. No chronic microhemorrhage or cerebral amyloid angiopathy. No hydrocephalus, age advanced atrophy or lobar predominant volume loss. No dural abnormality or extra-axial collection. Skull and  upper cervical spine: The visualized skull base, calvarium, upper cervical spine and extracranial soft tissues are normal. No marrow edema. Sinuses/Orbits: No fluid levels or advanced mucosal thickening. No mastoid effusion. Normal orbits. MRA HEAD FINDINGS Intracranial internal carotid arteries: Normal. Anterior cerebral arteries: Normal. Middle cerebral arteries: Normal. Posterior communicating arteries: Present bilaterally. Posterior cerebral arteries: Normal. Basilar artery: Normal. Vertebral arteries: Left dominant. Normal. Superior cerebellar arteries: Normal. Inferior cerebellar arteries are poorly visualized. IMPRESSION: 1. Unchanged size of intraparenchymal hemorrhage in the left frontal operculum white matter. No new mass effect or new site of hemorrhage. 2. No emergent large vessel occlusion.  No aneurysm or AVM. Electronically Signed   By: Deatra Robinson M.D.    On: 12/22/2017 17:17   Dg Knee Complete 4 Views Right  Result Date: 12/22/2017 CLINICAL DATA:  Right knee pain after fall at [employer] EXAM: RIGHT KNEE - COMPLETE 4+ VIEW COMPARISON:  07/17/2017 FINDINGS: Previous right total knee arthroplasty. No evidence of fracture, dislocation, or joint effusion. No evidence of arthropathy or other focal bone abnormality. Soft tissues are unremarkable. IMPRESSION: 1. No acute findings. 2. Prior right total knee arthroplasty. Electronically Signed   By: Signa Kell M.D.   On: 12/22/2017 15:24    EKG nsr at 82, nl axis,      Assessment & Plan:    Principal Problem:   Syncope Active Problems:   Hypokalemia   Anemia    Syncope Tele Trop I q6h x3 Carotid ultrasound Cardiac echo EEG  Intraparenchymal hemorrhage brain Repeat CT brain tomorrow Please make sure has follow up with neuosurgery, outpatient  UTI Await urine culture Cipro pharmacy to dose  ro Hypokalemia Replete Check cmp in am  ANemia Check cbc in am   DVT Prophylaxis  SCDs   AM Labs Ordered, also please review Full Orders  Family Communication: Admission, patients condition and plan of care including tests being ordered have been discussed with the patient  who indicate understanding and agree with the plan and Code Status.  Code Status FULL CODE  Likely DC to  home  Condition GUARDED    Consults called:  none  Admission status:  observation  Time spent in minutes : 45   Pearson Grippe M.D on 12/22/2017 at 6:32 PM  Between 7am to 7pm - Pager - [phone]  . After 7pm go to [email or web address] - password [facility]  [facility] - Office  [phone]

## 2017-12-23 ENCOUNTER — Observation Stay (HOSPITAL_COMMUNITY)
Admit: 2017-12-23 | Discharge: 2017-12-23 | Disposition: A | Discharge status: Home / Self Care | Payer: BLUE CROSS/BLUE SHIELD | Attending: Internal Medicine | Admitting: Internal Medicine

## 2017-12-23 ENCOUNTER — Observation Stay (HOSPITAL_BASED_OUTPATIENT_CLINIC_OR_DEPARTMENT_OTHER): Payer: BLUE CROSS/BLUE SHIELD

## 2017-12-23 ENCOUNTER — Observation Stay (HOSPITAL_COMMUNITY): Payer: BLUE CROSS/BLUE SHIELD

## 2017-12-23 DIAGNOSIS — R55 Syncope and collapse: Principal | ICD-10-CM

## 2017-12-23 DIAGNOSIS — I619 Nontraumatic intracerebral hemorrhage, unspecified: Secondary | ICD-10-CM | POA: Diagnosis not present

## 2017-12-23 DIAGNOSIS — I340 Nonrheumatic mitral (valve) insufficiency: Secondary | ICD-10-CM | POA: Diagnosis not present

## 2017-12-23 DIAGNOSIS — E876 Hypokalemia: Secondary | ICD-10-CM | POA: Diagnosis not present

## 2017-12-23 DIAGNOSIS — D649 Anemia, unspecified: Secondary | ICD-10-CM | POA: Diagnosis not present

## 2017-12-23 LAB — ECHOCARDIOGRAM COMPLETE
CHL CUP MV DEC (S): 232
CHL CUP TV REG PEAK VELOCITY: 222 cm/s
E/e' ratio: 10.74
EWDT: 232 ms
FS: 42 % (ref 28–44)
HEIGHTINCHES: 66 in
IV/PV OW: 0.9
LA diam end sys: 33 mm
LA vol A4C: 52.8 ml
LADIAMINDEX: 1.72 cm/m2
LASIZE: 33 mm
LAVOL: 51.9 mL
LAVOLIN: 27 mL/m2
LV E/e' medial: 10.74
LV e' LATERAL: 9.68 cm/s
LV sys vol: 18 mL (ref 14–42)
LVDIAVOL: 56 mL (ref 46–106)
LVDIAVOLIN: 29 mL/m2
LVEEAVG: 10.74
LVOT VTI: 32.5 cm
LVOT area: 2.27 cm2
LVOT peak grad rest: 9 mmHg
LVOTD: 17 mm
LVOTPV: 148 cm/s
LVOTSV: 74 mL
LVSYSVOLIN: 9 mL/m2
Lateral S' vel: 12.1 cm/s
MV pk A vel: 85.7 m/s
MV pk E vel: 104 m/s
MVPG: 4 mmHg
PW: 9.48 mm — AB (ref 0.6–1.1)
RV TAPSE: 25 mm
RV sys press: 28 mmHg
Simpson's disk: 68
Stroke v: 38 ml
TDI e' lateral: 9.68
TDI e' medial: 9.46
TR max vel: 222 cm/s
WEIGHTICAEL: 2747.81 [oz_av]

## 2017-12-23 LAB — COMPREHENSIVE METABOLIC PANEL
ALK PHOS: 115 U/L (ref 38–126)
ALT: 13 U/L — ABNORMAL LOW (ref 14–54)
ANION GAP: 9 (ref 5–15)
AST: 12 U/L — ABNORMAL LOW (ref 15–41)
Albumin: 3.2 g/dL — ABNORMAL LOW (ref 3.5–5.0)
BILIRUBIN TOTAL: 0.2 mg/dL — AB (ref 0.3–1.2)
BUN: 15 mg/dL (ref 6–20)
CALCIUM: 8.9 mg/dL (ref 8.9–10.3)
CO2: 27 mmol/L (ref 22–32)
Chloride: 104 mmol/L (ref 101–111)
Creatinine, Ser: 0.82 mg/dL (ref 0.44–1.00)
GFR calc Af Amer: >60 mL/min (ref >60)
Glucose, Bld: 85 mg/dL (ref 65–99)
Potassium: 3.8 mmol/L (ref 3.5–5.1)
Sodium: 140 mmol/L (ref 135–145)
TOTAL PROTEIN: 6.3 g/dL — AB (ref 6.5–8.1)

## 2017-12-23 LAB — POTASSIUM: Potassium: 3.6 mmol/L (ref 3.5–5.1)

## 2017-12-23 LAB — CBC
HCT: 35 % — ABNORMAL LOW (ref 36.0–46.0)
Hemoglobin: 11 g/dL — ABNORMAL LOW (ref 12.0–15.0)
MCH: 27.5 pg (ref 26.0–34.0)
MCHC: 31.4 g/dL (ref 30.0–36.0)
MCV: 87.5 fL (ref 78.0–100.0)
Platelets: 271 10*3/uL (ref 150–400)
RBC: 4 MIL/uL (ref 3.87–5.11)
RDW: 14 % (ref 11.5–15.5)
WBC: 7.9 10*3/uL (ref 4.0–10.5)

## 2017-12-23 LAB — TROPONIN I
TROPONIN I: 0.05 ng/mL — AB (ref <0.03)
TROPONIN I: 0.08 ng/mL — AB (ref <0.03)
Troponin I: 0.04 ng/mL (ref <0.03)

## 2017-12-23 LAB — MRSA PCR SCREENING: MRSA by PCR: NEGATIVE

## 2017-12-23 MED ORDER — ONDANSETRON HCL 4 MG/2ML IJ SOLN
4.0000 mg | Freq: Once | INTRAMUSCULAR | Status: AC
  Administered 2017-12-23: 4 mg via INTRAVENOUS
  Filled 2017-12-23: qty 2

## 2017-12-23 NOTE — Progress Notes (Signed)
[facility] (Dr.Doonquah's office) notified and message left with Miles Costain (office manager) regarding new neurology referral for patient.

## 2017-12-23 NOTE — Progress Notes (Signed)
PROGRESS NOTE    Jenna Smith  NWG:[id number]  DOB: 09/20/1951  DOA: 12/22/2017 PCP: Raliegh Ip, DO   Brief Admission Hx: Jenna Smith  is a 67 y.o. female, w recent surgery for RTKA apparently was at [facility] and had a syncopal episode,  ?witnessess saw her twitching according to ED physician after hitting her head  However no tongue lac or incontinence. Pt was brought to ED for evaluation.  The patient feels like this is all medication related as she was taking prednisone and opioids at the same time.  She now has a small intraparenchymal brain bleed secondary to the fall and head injury.  Neurosurgery was consulted in the ED and recommended repeat CT scan testing the following day.  MDM/Assessment & Plan:   1. Syncopal episode-witnessed in a [employer].  The patient is being worked up and receiving a cardiac echocardiogram and carotid ultrasound.  Trending troponins which have been negative.  EEG is pending.  There was some concern for possible seizure however patient believes that this is all secondary to medications as she was taking prednisone and opioids which she normally does not take any medications at all.  Neurology has been consulted.  Repeat CT has been ordered and pending. 2. UTI-Cipro ordered and following urine cultures. 3. Intraparenchymal hemorrhage in the brain- CT brain pending for repeat and follow-up.  Neurology has been consulted as well.  The patient will need to have outpatient neurosurgery follow-up with Dr. Dutch Quint. 4. Hypokalemia-replete eating and will follow and repeat testing.  Check magnesium. 5. Anemia, unspecified- repeat CBC stable.  DVT prophylaxis: SCDs Code Status: Full Family Communication: Husband Disposition Plan: Home tomorrow if stable   Consultants:  Neurology telephone call neurosurgery  Procedures:  Echocardiogram, carotid Dopplers, CT scan, MRI  Subjective: The patient feels dizzy otherwise feels like she is  getting back to her baseline.  She really wants to go home.  Objective: Vitals:   12/23/17 0800 12/23/17 0900 12/23/17 0935 12/23/17 1000  BP: 126/77   125/70  Pulse: 73 70 90 77  Resp: (!) 21 13 14 16   Temp:      TempSrc:      SpO2: 94% 95% 99% 97%  Weight:      Height:        Intake/Output Summary (Last 24 hours) at 12/23/2017 1113 Last data filed at 12/23/2017 0558 Gross per 24 hour  Intake -  Output 1400 ml  Net -1400 ml   Filed Weights   12/22/17 1051 12/22/17 2256 12/23/17 0500  Weight: 77.1 kg (170 lb) 77.6 kg (171 lb 1.2 oz) 77.9 kg (171 lb 11.8 oz)     REVIEW OF SYSTEMS  As per history otherwise all reviewed and reported negative  Exam:  General exam: Awake, alert, no distress, cooperative and pleasant, normal speech patterns. Respiratory system: Clear. No increased work of breathing. Cardiovascular system: S1 & S2 heard, RRR. No JVD, murmurs, gallops, clicks or pedal edema. Gastrointestinal system: Abdomen is nondistended, soft and nontender. Normal bowel sounds heard. Central nervous system: Alert and oriented. No focal neurological deficits. Extremities: no CCE.  Data Reviewed: Basic Metabolic Panel: Recent Labs  Lab 12/22/17 1326 12/22/17 2330 12/23/17 0459  NA 137  --  140  K 3.3* 3.6 3.8  CL 100*  --  104  CO2 27  --  27  GLUCOSE 101*  --  85  BUN 25*  --  15  CREATININE 0.77  --  0.82  CALCIUM 9.1  --  8.9  MG 2.1  --   --    Liver Function Tests: Recent Labs  Lab 12/22/17 1326 12/23/17 0459  AST 14* 12*  ALT 13* 13*  ALKPHOS 114 115  BILITOT 0.2* 0.2*  PROT 6.9 6.3*  ALBUMIN 3.5 3.2*   Recent Labs  Lab 12/22/17 1326  LIPASE 19   No results for input(s): AMMONIA in the last 168 hours. CBC: Recent Labs  Lab 12/22/17 1326 12/23/17 0459  WBC 12.2* 7.9  NEUTROABS 9.8*  --   HGB 11.4* 11.0*  HCT 36.0 35.0*  MCV 86.5 87.5  PLT 274 271   Cardiac Enzymes: Recent Labs  Lab 12/22/17 2336 12/23/17 0459  TROPONINI 0.08*  0.05*   CBG (last 3)  No results for input(s): GLUCAP in the last 72 hours. Recent Results (from the past 240 hour(s))  Rapid Strep Screen (Not at [facility])     Status: None   Collection Time: 12/18/17 12:33 PM  Result Value Ref Range Status   Strep Gp A Ag, IA W/Reflex Negative Negative Final  Culture, Group A Strep     Status: None   Collection Time: 12/18/17 12:33 PM  Result Value Ref Range Status   Strep A Culture CANCELED      Comment: Test not performed  Result canceled by the ancillary.   MRSA PCR Screening     Status: None   Collection Time: 12/22/17 10:37 PM  Result Value Ref Range Status   MRSA by PCR NEGATIVE NEGATIVE Final    Comment:        The GeneXpert MRSA Assay (FDA approved for NASAL specimens only), is one component of a comprehensive MRSA colonization surveillance program. It is not intended to diagnose MRSA infection nor to guide or monitor treatment for MRSA infections. Performed at [facility], [address] [place], [place] [address]      Studies: Dg Chest 2 View  Result Date: 12/22/2017 CLINICAL DATA:  Syncope. EXAM: CHEST  2 VIEW COMPARISON:  Radiographs of November 12, 2017. FINDINGS: The heart size and mediastinal contours are within normal limits. Both lungs are clear. No pneumothorax or pleural effusion is noted. The visualized skeletal structures are unremarkable. IMPRESSION: No active cardiopulmonary disease. Electronically Signed   By: Lupita Raider, M.D.   On: 12/22/2017 13:58   Ct Head Wo Contrast  Addendum Date: 12/22/2017   ADDENDUM REPORT: 12/22/2017 15:54 ADDENDUM: Study discussed by telephone with Dr. Vanetta Mulders on 12/22/2017 at 1543 hrs. Electronically Signed   By: Odessa Fleming M.D.   On: 12/22/2017 15:54   Result Date: 12/22/2017 CLINICAL DATA:  67 year old female with witnessed syncope, fall with head injury and subsequent seizure activity of [employer] today. EXAM: CT HEAD WITHOUT CONTRAST TECHNIQUE: Contiguous axial images were  obtained from the base of the skull through the vertex without intravenous contrast. COMPARISON:  Head CT without contrast 01/16/2016. FINDINGS: Brain: There is a curvilinear area of abnormal hyperdensity along the left anterior operculum subcortical white matter, new since 2017 and best demonstrated on series 4, image 27. No surrounding edema. No regional mass effect. The overlying cortex appears to remain normal. No other intracranial hemorrhage identified. No extra-axial blood or collection. No intracranial mass effect. No midline shift. Bilateral gray-white matter differentiation remains normal. No acute cortically based infarct. No ventriculomegaly. Vascular: Dominant distal left vertebral artery. No suspicious intracranial vascular hyperdensity. Skull: Subtle nondisplaced linear right lateral skull fracture best seen on series 5, image 7. Stable skull elsewhere with no other  acute osseous abnormality identified. Sinuses/Orbits: Clear. Resolved sinus bubbly opacity seen in 2017. Tympanic cavities and mastoids remain clear. Other: Broad-based right superior convexity scalp hematoma measuring up to 7 millimeters in thickness. Suspicion of underlying subtle nondisplaced skull fracture (series 3, image 47 and sagittal image 7). Other scalp soft tissues, visible orbits soft tissues and visible deep soft tissue spaces of the face appear normal. IMPRESSION: 1. Positive for combination nondisplaced right lateral skull fracture, and small curvilinear acute hemorrhage in the contralateral left hemisphere subcortical white matter. Perhaps this represents an unusual Coup-Contrecoup injury with white matter shear hemorrhage, but consider also an acute hemorrhagic lacunar infarct. Follow-up brain MRI (noncontrast should suffice) may be valuable. 2. Subtle nondisplaced right lateral temporal/parietal skull fracture with overlying broad-based scalp hematoma. Electronically Signed: By: Odessa Fleming M.D. On: 12/22/2017 15:37   Mr  Maxine Glenn Head Wo Contrast  Result Date: 12/22/2017 CLINICAL DATA:  Altered mental status. Fall with intracranial hemorrhage EXAM: MRI HEAD WITHOUT CONTRAST MRA HEAD WITHOUT CONTRAST TECHNIQUE: Multiplanar, multiecho pulse sequences of the brain and surrounding structures were obtained without intravenous contrast. Angiographic images of the head were obtained using MRA technique without contrast. COMPARISON:  None. Head CT 12/22/2017 FINDINGS: MRI HEAD FINDINGS Brain: The midline structures are normal. Crescentic focus of hemorrhage within the subcortical left frontal operculum white matter. No evidence of acute infarct. The brain parenchymal signal is normal. No mass lesion. No chronic microhemorrhage or cerebral amyloid angiopathy. No hydrocephalus, age advanced atrophy or lobar predominant volume loss. No dural abnormality or extra-axial collection. Skull and upper cervical spine: The visualized skull base, calvarium, upper cervical spine and extracranial soft tissues are normal. No marrow edema. Sinuses/Orbits: No fluid levels or advanced mucosal thickening. No mastoid effusion. Normal orbits. MRA HEAD FINDINGS Intracranial internal carotid arteries: Normal. Anterior cerebral arteries: Normal. Middle cerebral arteries: Normal. Posterior communicating arteries: Present bilaterally. Posterior cerebral arteries: Normal. Basilar artery: Normal. Vertebral arteries: Left dominant. Normal. Superior cerebellar arteries: Normal. Inferior cerebellar arteries are poorly visualized. IMPRESSION: 1. Unchanged size of intraparenchymal hemorrhage in the left frontal operculum white matter. No new mass effect or new site of hemorrhage. 2. No emergent large vessel occlusion.  No aneurysm or AVM. Electronically Signed   By: Deatra Robinson M.D.   On: 12/22/2017 17:17   Mr Brain Wo Contrast (neuro Protocol)  Result Date: 12/22/2017 CLINICAL DATA:  Altered mental status. Fall with intracranial hemorrhage EXAM: MRI HEAD WITHOUT  CONTRAST MRA HEAD WITHOUT CONTRAST TECHNIQUE: Multiplanar, multiecho pulse sequences of the brain and surrounding structures were obtained without intravenous contrast. Angiographic images of the head were obtained using MRA technique without contrast. COMPARISON:  None. Head CT 12/22/2017 FINDINGS: MRI HEAD FINDINGS Brain: The midline structures are normal. Crescentic focus of hemorrhage within the subcortical left frontal operculum white matter. No evidence of acute infarct. The brain parenchymal signal is normal. No mass lesion. No chronic microhemorrhage or cerebral amyloid angiopathy. No hydrocephalus, age advanced atrophy or lobar predominant volume loss. No dural abnormality or extra-axial collection. Skull and upper cervical spine: The visualized skull base, calvarium, upper cervical spine and extracranial soft tissues are normal. No marrow edema. Sinuses/Orbits: No fluid levels or advanced mucosal thickening. No mastoid effusion. Normal orbits. MRA HEAD FINDINGS Intracranial internal carotid arteries: Normal. Anterior cerebral arteries: Normal. Middle cerebral arteries: Normal. Posterior communicating arteries: Present bilaterally. Posterior cerebral arteries: Normal. Basilar artery: Normal. Vertebral arteries: Left dominant. Normal. Superior cerebellar arteries: Normal. Inferior cerebellar arteries are poorly visualized. IMPRESSION: 1. Unchanged size of  intraparenchymal hemorrhage in the left frontal operculum white matter. No new mass effect or new site of hemorrhage. 2. No emergent large vessel occlusion.  No aneurysm or AVM. Electronically Signed   By: Deatra Robinson M.D.   On: 12/22/2017 17:17   Dg Knee Complete 4 Views Right  Result Date: 12/22/2017 CLINICAL DATA:  Right knee pain after fall at [employer] EXAM: RIGHT KNEE - COMPLETE 4+ VIEW COMPARISON:  07/17/2017 FINDINGS: Previous right total knee arthroplasty. No evidence of fracture, dislocation, or joint effusion. No evidence of arthropathy or  other focal bone abnormality. Soft tissues are unremarkable. IMPRESSION: 1. No acute findings. 2. Prior right total knee arthroplasty. Electronically Signed   By: Signa Kell M.D.   On: 12/22/2017 15:24    Scheduled Meds: . ciprofloxacin  500 mg Oral BID  . loratadine  10 mg Oral Daily   Continuous Infusions:  Principal Problem:   Syncope Active Problems:   Hypokalemia   Anemia  Critical Care Time spent: 33 minutes  Standley Dakins, MD, FAAFP [facility] Pager [phone]  If 7PM-7AM, please contact night-coverage [email or web address] Password [email or web address] 12/23/2017, 11:13 AM    LOS: 0 days

## 2017-12-23 NOTE — Progress Notes (Signed)
Trop of 0.08 called by lab. MD notified via text page at [place].

## 2017-12-23 NOTE — Consult Note (Signed)
[place] A. Merlene Laughter, MD     [email or web address]          Jenna Smith is an 67 y.o. female.   ASSESSMENT/PLAN: 1. Syncopal episode resulting in head injury and the right parenchymal hemorrhage that is small to moderate in size.  The mechanism of the syncopal episodes seems to be medication related.  The semiology does not suggest seizure.  An EEG will be obtained.  2.  Stable left parenchymal hemorrhage involve the operculum.     The patient is a 67 year old white female who recently underwent right knee replacement surgery.  She was given postoperative medications.  It appears that she developed respiratory symptoms and was given prednisone in addition to her medications which she was prescribed postoperatively.  She was given pain medication a muscle relaxer for her symptoms postoperatively.  The prednisone was started and the she felt dizzy and lightheaded.  She was in [place] shopping when she apparently fell to the ground.  She denies any focal numbness or weakness.  She denies chest pain, palpitation or shortness of breath.  It appears that she did hit her head on the right side.  Imaging shows intracranial hemorrhage.  The patient does not report any bladder or bowel incontinence.  There is question of possible shaking activity.  She denies any trauma to the oral cavity or tongue biting.   She tells me she has had untoward reaction to prednisone before.  There is no history of seizures.  There is no family history of seizures. The review of systems otherwise negative.   GENERAL:   She is currently doing well in no acute distress.  HEENT:   There is mild soreness on palpation involving the right parietal region although I do not palpate any clear injuries or swelling.  ABDOMEN: soft  EXTREMITIES: No edema ; she has significant soreness to palpation of the right knee where she recently underwent surgery.   BACK:  Normal  SKIN: Normal by inspection.      MENTAL STATUS: Alert and oriented. Speech, language and cognition are generally intact. Judgment and insight normal.   CRANIAL NERVES: Pupils are equal, round and reactive to light and accomodation; extra ocular movements are full, there is no significant nystagmus; visual fields are full; upper and lower facial muscles are normal in strength and symmetric, there is no flattening of the nasolabial folds; tongue is midline; uvula is midline; shoulder elevation is normal.  MOTOR: Normal tone, bulk and strength; no pronator drift.  COORDINATION: Left finger to nose is normal, right finger to nose is normal, No rest tremor; no intention tremor; no postural tremor; no bradykinesia.  REFLEXES: Deep tendon reflexes are symmetrical and normal.   SENSATION: Normal to light touch, temperature, and pain.      Blood pressure 122/76, pulse 78, temperature 97.7 F (36.5 C), temperature source Oral, resp. rate 17, height '5\' 6"'$  (1.676 m), weight 171 lb 11.8 oz (77.9 kg), SpO2 95 %.  Past Medical History:  Diagnosis Date  . Allergy   . Anemia 12/22/2017    Past Surgical History:  Procedure Laterality Date  . BREAST SURGERY Bilateral May 2001   Breast reduction  . COSMETIC SURGERY  September 2012   Face lift  . TONSILLECTOMY    . 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 Aneurysm Mother        passed away at age 6  . Cancer Father        passed away after Prostate cancer metastesize  . Cancer Sister        she passed away 50 due to Breast Cancer  . Stroke Sister   . Aneurysm Sister     Social History:  reports that she quit smoking about 33 years ago. Her smoking use included cigarettes. She has a 4.50 pack-year smoking history. she has never used smokeless tobacco. She reports that she does not drink  alcohol or use drugs.  Allergies:  Allergies  Allergen Reactions  . Indomethacin Swelling    Face swells up  . Latex Hives  . Neosporin [Neomycin-Bacitracin Zn-Polymyx] Hives  . Penicillins Hives    Has patient had a PCN reaction causing immediate rash, facial/tongue/throat swelling, SOB or lightheadedness with hypotension: no Has patient had a PCN reaction causing severe rash involving mucus membranes or skin necrosis: no Has patient had a PCN reaction that required hospitalization: no Has patient had a PCN reaction occurring within the last 10 years: no If all of the above answers are "NO", then may proceed with Cephalosporin use.     Medications: Prior to Admission medications   Medication Sig Start Date End Date Taking? Authorizing Provider  aspirin EC 325 MG tablet Take 1 tablet (325 mg total) by mouth 2 (two) times daily. 11/20/17  Yes Constable, Amber, PA-C  cetirizine (ZYRTEC) 10 MG tablet Take 10 mg by mouth daily as needed for allergies.    Yes [provider]  HYDROcodone-acetaminophen (NORCO/VICODIN) 5-325 MG tablet Take 1-2 tablets by mouth every 4 (four) hours as needed for moderate pain ((score 4 to 6)). 11/20/17  Yes Constable, Amber, PA-C  HYDROcodone-homatropine (HYCODAN) 5-1.5 MG/5ML syrup Take 5 mLs by mouth every 8 (eight) hours as needed for cough. 12/18/17  Yes Hawks, Christy A, FNP  methocarbamol (ROBAXIN) 500 MG tablet Take 1 tablet (500 mg total) by mouth every 6 (six) hours as needed for muscle spasms. 11/20/17  Yes Constable, Amber, PA-C  predniSONE (STERAPRED UNI-PAK 21 TAB) 5 MG (21) TBPK tablet Use as directed 12/18/17  Yes Hawks, Christy A, FNP    Scheduled Meds: . ciprofloxacin  500 mg Oral BID  . loratadine  10 mg Oral Daily   Continuous Infusions: . 0.9 % NaCl with KCl 20 mEq / L 75 mL/hr at 12/22/17 2311   PRN Meds:.acetaminophen **OR** acetaminophen, methocarbamol, morphine injection     Results for orders placed or performed during  the hospital encounter of 12/22/17 (from the past 48 hour(s))  Comprehensive metabolic panel     Status: Abnormal   Collection Time: 12/22/17  1:26 PM  Result Value Ref Range   Sodium 137 135 - 145 mmol/L   Potassium 3.3 (L) 3.5 - 5.1 mmol/L   Chloride 100 (L) 101 - 111 mmol/L   CO2 27 22 - 32 mmol/L   Glucose, Bld 101 (H) 65 - 99 mg/dL   BUN 25 (H) 6 - 20 mg/dL   Creatinine, Ser 0.77 0.44 - 1.00 mg/dL   Calcium 9.1 8.9 - 10.3 mg/dL   Total Protein 6.9 6.5 - 8.1 g/dL   Albumin 3.5 3.5 - 5.0 g/dL   AST 14 (L) 15 - 41 U/L   ALT 13 (L) 14 - 54 U/L   Alkaline Phosphatase 114 38 - 126 U/L   Total Bilirubin 0.2 (L) 0.3 -  1.2 mg/dL   GFR calc non Af Amer >60 >60 mL/min   GFR calc Af Amer >60 >60 mL/min    Comment: (NOTE) The eGFR has been calculated using the CKD EPI equation. This calculation has not been validated in all clinical situations. eGFR's persistently <60 mL/min signify possible Chronic Kidney Disease.    Anion gap 10 5 - 15    Comment: Performed at [facility], [address] [place], [place] [address]  Lipase, blood     Status: None   Collection Time: 12/22/17  1:26 PM  Result Value Ref Range   Lipase 19 11 - 51 U/L    Comment: Performed at [facility], [address] [place], [place] [address]  CBC with Differential/Platelet     Status: Abnormal   Collection Time: 12/22/17  1:26 PM  Result Value Ref Range   WBC 12.2 (H) 4.0 - 10.5 K/uL   RBC 4.16 3.87 - 5.11 MIL/uL   Hemoglobin 11.4 (L) 12.0 - 15.0 g/dL   HCT 36.0 36.0 - 46.0 %   MCV 86.5 78.0 - 100.0 fL   MCH 27.4 26.0 - 34.0 pg   MCHC 31.7 30.0 - 36.0 g/dL   RDW 13.6 11.5 - 15.5 %   Platelets 274 150 - 400 K/uL   Neutrophils Relative % 80 %   Lymphocytes Relative 11 %   Monocytes Relative 9 %   Eosinophils Relative 0 %   Basophils Relative 0 %   Neutro Abs 9.8 (H) 1.7 - 7.7 K/uL   Lymphs Abs 1.3 0.7 - 4.0 K/uL   Monocytes Absolute 1.1 (H) 0.1 - 1.0 K/uL   Eosinophils Absolute 0.0 0.0 - 0.7 K/uL    Basophils Absolute 0.0 0.0 - 0.1 K/uL   RBC Morphology FEW BANDS     Comment: Performed at [facility], [address], [place], [place] [address]  Ethanol     Status: None   Collection Time: 12/22/17  1:26 PM  Result Value Ref Range   Alcohol, Ethyl (B) <10 <10 mg/dL    Comment:        LOWEST DETECTABLE LIMIT FOR SERUM ALCOHOL IS 10 mg/dL FOR MEDICAL PURPOSES ONLY Performed at [facility], [address] [place], [place] [address]   Magnesium     Status: None   Collection Time: 12/22/17  1:26 PM  Result Value Ref Range   Magnesium 2.1 1.7 - 2.4 mg/dL    Comment: Performed at [facility], [address], [place], [place] [address]  Rapid urine drug screen (hospital performed)     Status: Abnormal   Collection Time: 12/22/17  2:30 PM  Result Value Ref Range   Opiates POSITIVE (A) NONE DETECTED   Cocaine NONE DETECTED NONE DETECTED   Benzodiazepines NONE DETECTED NONE DETECTED   Amphetamines NONE DETECTED NONE DETECTED   Tetrahydrocannabinol NONE DETECTED NONE DETECTED   Barbiturates NONE DETECTED NONE DETECTED    Comment: (NOTE) DRUG SCREEN FOR MEDICAL PURPOSES ONLY.  IF CONFIRMATION IS NEEDED FOR ANY PURPOSE, NOTIFY LAB WITHIN 5 DAYS. LOWEST DETECTABLE LIMITS FOR URINE DRUG SCREEN Drug Class                     Cutoff (ng/mL) Amphetamine and metabolites    1000 Barbiturate and metabolites    200 Benzodiazepine                 594 Tricyclics and metabolites     300 Opiates and metabolites  300 Cocaine and metabolites        300 THC                            50 Performed at [facility], [address] [place], [place] [address]   Urinalysis, Routine w reflex microscopic     Status: Abnormal   Collection Time: 12/22/17  2:30 PM  Result Value Ref Range   Color, Urine YELLOW YELLOW   APPearance CLOUDY (A) CLEAR   Specific Gravity, Urine 1.029 1.005 - 1.030   pH 5.0 5.0 - 8.0   Glucose, UA NEGATIVE NEGATIVE mg/dL   Hgb urine dipstick MODERATE (A) NEGATIVE    Bilirubin Urine SMALL (A) NEGATIVE   Ketones, ur NEGATIVE NEGATIVE mg/dL   Protein, ur 100 (A) NEGATIVE mg/dL   Nitrite NEGATIVE NEGATIVE   Leukocytes, UA NEGATIVE NEGATIVE   RBC / HPF 6-30 0 - 5 RBC/hpf   WBC, UA 6-30 0 - 5 WBC/hpf   Bacteria, UA RARE (A) NONE SEEN   Squamous Epithelial / LPF 0-5 (A) NONE SEEN   Mucus PRESENT    Ca Oxalate Crys, UA PRESENT     Comment: Performed at [facility], [address] [place], [place] [address]  MRSA PCR Screening     Status: None   Collection Time: 12/22/17 10:37 PM  Result Value Ref Range   MRSA by PCR NEGATIVE NEGATIVE    Comment:        The GeneXpert MRSA Assay (FDA approved for NASAL specimens only), is one component of a comprehensive MRSA colonization surveillance program. It is not intended to diagnose MRSA infection nor to guide or monitor treatment for MRSA infections. Performed at [facility], [address], [place], [place] [address]   Potassium     Status: None   Collection Time: 12/22/17 11:30 PM  Result Value Ref Range   Potassium 3.6 3.5 - 5.1 mmol/L    Comment: Performed at [facility], [address] [place], [place] [address]  Troponin I (q 6hr x 3)     Status: Abnormal   Collection Time: 12/22/17 11:36 PM  Result Value Ref Range   Troponin I 0.08 (HH) <0.03 ng/mL    Comment: CRITICAL RESULT CALLED TO, READ BACK BY AND VERIFIED WITH: WAGONER,R @ 0115 ON 2.26.19 BY BOWMAN,L Performed at [facility], [address] [place], [place] [address]   CBC     Status: Abnormal   Collection Time: 12/23/17  4:59 AM  Result Value Ref Range   WBC 7.9 4.0 - 10.5 K/uL   RBC 4.00 3.87 - 5.11 MIL/uL   Hemoglobin 11.0 (L) 12.0 - 15.0 g/dL   HCT 35.0 (L) 36.0 - 46.0 %   MCV 87.5 78.0 - 100.0 fL   MCH 27.5 26.0 - 34.0 pg   MCHC 31.4 30.0 - 36.0 g/dL   RDW 14.0 11.5 - 15.5 %   Platelets 271 150 - 400 K/uL    Comment: Performed at [facility], [address] [place],  [address]    Studies/Results:  HEAD  CT FINDINGS: Brain: There is a curvilinear area of abnormal hyperdensity along the left anterior operculum subcortical white matter, new since 2017 and best demonstrated on series 4, image 27. No surrounding edema. No regional mass effect. The overlying cortex appears to remain normal.  No other intracranial hemorrhage identified. No extra-axial blood or collection. No intracranial mass effect. No midline shift. Bilateral gray-white matter  differentiation remains normal. No acute cortically based infarct. No ventriculomegaly.  Vascular: Dominant distal left vertebral artery. No suspicious intracranial vascular hyperdensity.  Skull: Subtle nondisplaced linear right lateral skull fracture best seen on series 5, image 7. Stable skull elsewhere with no other acute osseous abnormality identified.  Sinuses/Orbits: Clear. Resolved sinus bubbly opacity seen in 2017. Tympanic cavities and mastoids remain clear.  Other: Broad-based right superior convexity scalp hematoma measuring up to 7 millimeters in thickness. Suspicion of underlying subtle nondisplaced skull fracture (series 3, image 47 and sagittal image 7).  Other scalp soft tissues, visible orbits soft tissues and visible deep soft tissue spaces of the face appear normal.  IMPRESSION: 1. Positive for combination nondisplaced right lateral skull fracture, and small curvilinear acute hemorrhage in the contralateral left hemisphere subcortical white matter. Perhaps this represents an unusual Coup-Contrecoup injury with white matter shear hemorrhage, but consider also an acute hemorrhagic lacunar infarct. Follow-up brain MRI (noncontrast should suffice) may be valuable. 2. Subtle nondisplaced right lateral temporal/parietal skull fracture with overlying broad-based scalp hematoma.   BRAIN MRI MRA FINDINGS: MRI HEAD FINDINGS  Brain: The midline structures are normal. Crescentic focus of hemorrhage within the  subcortical left frontal operculum white matter. No evidence of acute infarct. The brain parenchymal signal is normal. No mass lesion. No chronic microhemorrhage or cerebral amyloid angiopathy. No hydrocephalus, age advanced atrophy or lobar predominant volume loss. No dural abnormality or extra-axial collection.  Skull and upper cervical spine: The visualized skull base, calvarium, upper cervical spine and extracranial soft tissues are normal. No marrow edema.  Sinuses/Orbits: No fluid levels or advanced mucosal thickening. No mastoid effusion. Normal orbits.  MRA HEAD FINDINGS  Intracranial internal carotid arteries: Normal.  Anterior cerebral arteries: Normal.  Middle cerebral arteries: Normal.  Posterior communicating arteries: Present bilaterally.  Posterior cerebral arteries: Normal.  Basilar artery: Normal.  Vertebral arteries: Left dominant. Normal.  Superior cerebellar arteries: Normal.  Inferior cerebellar arteries are poorly visualized.  IMPRESSION: 1. Unchanged size of intraparenchymal hemorrhage in the left frontal operculum white matter. No new mass effect or new site of hemorrhage. 2. No emergent large vessel occlusion.  No aneurysm or AVM.       The head CT scan and MRI scan reviewed in person.  There is increased signal involving the left upper limb region.  This is confirmed with MRI showing increased signal on DWI in the same location.  The SWI scan shows reduced signal.   Karyss Frese A. Merlene Laughter, M.D.  Diplomate, Tax adviser of Psychiatry and Neurology ( Neurology). 12/23/2017, 9:28 AM

## 2017-12-23 NOTE — Procedures (Signed)
  [place] A. Merlene Laughter, MD     [email or web address]           HISTORY: The patient is a 67 year old lady who presents with a syncopal episode associated with shaking events suspicious for seizures.  MEDICATIONS: Scheduled Meds: . ciprofloxacin  500 mg Oral BID   Continuous Infusions: PRN Meds:.acetaminophen **OR** acetaminophen, methocarbamol, morphine injection  Prior to Admission medications   Medication Sig Start Date End Date Taking? Authorizing Provider  aspirin EC 325 MG tablet Take 1 tablet (325 mg total) by mouth 2 (two) times daily. 11/20/17  Yes Constable, Amber, PA-C  cetirizine (ZYRTEC) 10 MG tablet Take 10 mg by mouth daily as needed for allergies.    Yes [provider]  HYDROcodone-acetaminophen (NORCO/VICODIN) 5-325 MG tablet Take 1-2 tablets by mouth every 4 (four) hours as needed for moderate pain ((score 4 to 6)). 11/20/17  Yes Constable, Amber, PA-C  HYDROcodone-homatropine (HYCODAN) 5-1.5 MG/5ML syrup Take 5 mLs by mouth every 8 (eight) hours as needed for cough. 12/18/17  Yes Hawks, Christy A, FNP  methocarbamol (ROBAXIN) 500 MG tablet Take 1 tablet (500 mg total) by mouth every 6 (six) hours as needed for muscle spasms. 11/20/17  Yes Constable, Amber, PA-C  predniSONE (STERAPRED UNI-PAK 21 TAB) 5 MG (21) TBPK tablet Use as directed 12/18/17  Yes Hawks, Christy A, FNP      ANALYSIS: A 16 channel recording using standard 10 20 measurements is conducted for 22 minutes.   There is a well-formed posterior dominant rhythm of 12 hertz which attenuates with eye-opening.  There is beta activity observed in the frontal areas.  Awake and drowsy activities are observed.  There is rhythm enter a spindles and K complexes seen infrequently.  Focal and lateral slowing are not observed.  Photic stimulation is carried out without abnormal changes in the background activity.  There is no epileptiform activity is observed.   IMPRESSION: 1.   This is a  normal recording of awake and drowsy states.      Herman Fiero A. Merlene Laughter, M.D.  Diplomate, Tax adviser of Psychiatry and Neurology ( Neurology).

## 2017-12-23 NOTE — Progress Notes (Signed)
EEG Completed; Results Pending. Dr Merlene Laughter notified.

## 2017-12-23 NOTE — Plan of Care (Signed)
  Health Behavior/Discharge Planning: Ability to manage health-related needs will improve 12/23/2017 2353 - Progressing by Cassandria Anger, RN   Clinical Measurements: Ability to maintain clinical measurements within normal limits will improve 12/23/2017 2353 - Progressing by Cassandria Anger, RN Will remain free from infection 12/23/2017 2353 - Progressing by Cassandria Anger, RN   Nutrition: Adequate nutrition will be maintained 12/23/2017 2353 - Progressing by Cassandria Anger, RN   Coping: Level of anxiety will decrease 12/23/2017 2353 - Progressing by Cassandria Anger, RN   Pain Managment: General experience of comfort will improve 12/23/2017 2353 - Progressing by Cassandria Anger, RN   Safety: Ability to remain free from injury will improve 12/23/2017 2353 - Progressing by Cassandria Anger, RN

## 2017-12-23 NOTE — Progress Notes (Signed)
*  PRELIMINARY RESULTS* Echocardiogram 2D Echocardiogram has been performed.  Jenna Smith 12/23/2017, 10:23 AM

## 2017-12-24 ENCOUNTER — Encounter: Payer: BLUE CROSS/BLUE SHIELD | Admitting: Physical Therapy

## 2017-12-24 DIAGNOSIS — E876 Hypokalemia: Secondary | ICD-10-CM | POA: Diagnosis not present

## 2017-12-24 DIAGNOSIS — R55 Syncope and collapse: Secondary | ICD-10-CM | POA: Diagnosis not present

## 2017-12-24 DIAGNOSIS — I619 Nontraumatic intracerebral hemorrhage, unspecified: Secondary | ICD-10-CM | POA: Diagnosis not present

## 2017-12-24 LAB — URINE CULTURE: Culture: <10000 — AB

## 2017-12-24 LAB — HIV ANTIBODY (ROUTINE TESTING W REFLEX): HIV SCREEN 4TH GENERATION: NONREACTIVE

## 2017-12-24 MED ORDER — NITROFURANTOIN MONOHYD MACRO 100 MG PO CAPS: 100.0000 mg | ORAL_CAPSULE | Freq: Two times a day (BID) | ORAL | 0 refills | Status: AC

## 2017-12-24 NOTE — Evaluation (Signed)
Physical Therapy Evaluation Patient Details Name: Jenna Smith MRN: [id number] DOB: May 08, 1951 Today's Date: 12/24/2017   History of Present Illness  Jenna Smith  is a 67 y.o. female, w recent surgery for RTKA apparently was [facility] and had a syncopal episode,  ?witnessess saw her twitching according to ED physician after hitting her head  However no tongue lac or incontinence. Pt was brought to ED for evaluation.     Clinical Impression  Patient functioning at baseline for functional mobility and gait.  Plan: discharge from physical therapy to nursing secondary to patient going home today.  Recommend resume HHPT or out-patient PT for right total knee replacement rehab.    Follow Up Recommendations Home health PT;Outpatient PT    Equipment Recommendations       Recommendations for Other Services       Precautions / Restrictions Precautions Precautions: None Restrictions Weight Bearing Restrictions: No      Mobility  Bed Mobility Overal bed mobility: Independent                Transfers Overall transfer level: Independent                  Ambulation/Gait Ambulation/Gait assistance: Modified independent (Device/Increase time) Ambulation Distance (Feet): 150 Feet Assistive device: None Gait Pattern/deviations: WFL(Within Functional Limits)   Gait velocity interpretation: at or above normal speed for age/gender General Gait Details: grossly WFL except occasional veering left, and antalgic stepping on RLE due to stiff knee, no loss of balance  Stairs            Wheelchair Mobility    Modified Rankin (Stroke Patients Only)       Balance Overall balance assessment: Mild deficits observed, not formally tested                                           Pertinent Vitals/Pain Pain Assessment: No/denies pain    Home Living Family/patient expects to be discharged to:: Private residence Living Arrangements:  Spouse/significant other Available Help at Discharge: Family Type of Home: House Home Access: Level entry     Home Layout: One level Home Equipment: Emergency planning/management officer - 2 wheels;Bedside commode;Cane - single point      Prior Function Level of Independence: Independent               Hand Dominance        Extremity/Trunk Assessment   Upper Extremity Assessment Upper Extremity Assessment: Overall WFL for tasks assessed    Lower Extremity Assessment Lower Extremity Assessment: Overall WFL for tasks assessed    Cervical / Trunk Assessment Cervical / Trunk Assessment: Normal  Communication   Communication: No difficulties  Cognition Arousal/Alertness: Awake/alert Behavior During Therapy: WFL for tasks assessed/performed Overall Cognitive Status: Within Functional Limits for tasks assessed                                        General Comments      Exercises     Assessment/Plan    PT Assessment All further PT needs can be met in the next venue of care  PT Problem List Decreased mobility;Decreased activity tolerance;Decreased strength;Decreased range of motion       PT Treatment Interventions      PT Goals (Current goals  can be found in the Care Plan section)  Acute Rehab PT Goals Patient Stated Goal: return home PT Goal Formulation: With patient/family Time For Goal Achievement: January 20, 2018 Potential to Achieve Goals: Good    Frequency     Barriers to discharge        Co-evaluation               AM-PAC PT "6 Clicks" Daily Activity  Outcome Measure Difficulty turning over in bed (including adjusting bedclothes, sheets and blankets)?: None Difficulty moving from lying on back to sitting on the side of the bed? : None Difficulty sitting down on and standing up from a chair with arms (e.g., wheelchair, bedside commode, etc,.)?: None Help needed moving to and from a bed to chair (including a wheelchair)?: None Help needed walking  in hospital room?: None Help needed climbing 3-5 steps with a railing? : None 6 Click Score: 24    End of Session   Activity Tolerance: Patient tolerated treatment well Patient left: in bed;with call bell/phone within reach;with family/visitor present Nurse Communication: Mobility status      Time: 4627-0350 PT Time Calculation (min) (ACUTE ONLY): 24 min   Charges:   PT Evaluation $PT Eval Low Complexity: 1 Low PT Treatments $Therapeutic Activity: 23-37 mins   PT G Codes:        11:47 AM, 01/20/18 Ocie Bob, MPT Physical Therapist with [facility] [phone] office [phone] mobile phone

## 2017-12-24 NOTE — Progress Notes (Incomplete)
AVS

## 2017-12-24 NOTE — Discharge Instructions (Signed)
Please be sure to make an appointment to follow up with the neurosurgeon in next 2 weeks.     Follow with Primary MD  Janora Norlander, DO  and other consultants as instructed your Hospitalist MD  Please get a complete blood count and chemistry panel checked by your Primary MD at your next visit, and again as instructed by your Primary MD.  Get Medicines reviewed and adjusted: Please take all your medications with you for your next visit with your Primary MD  Laboratory/radiological data: Please request your Primary MD to go over all hospital tests and procedure/radiological results at the follow up, please ask your Primary MD to get all Hospital records sent to his/her office.  In some cases, they will be blood work, cultures and biopsy results pending at the time of your discharge. Please request that your primary care M.D. follows up on these results.  Also Note the following: If you experience worsening of your admission symptoms, develop shortness of breath, life threatening emergency, suicidal or homicidal thoughts you must seek medical attention immediately by calling 911 or calling your MD immediately  if symptoms less severe.  You must read complete instructions/literature along with all the possible adverse reactions/side effects for all the Medicines you take and that have been prescribed to you. Take any new Medicines after you have completely understood and accpet all the possible adverse reactions/side effects.   Do not drive when taking Pain medications or sleeping medications (Benzodaizepines)  Do not take more than prescribed Pain, Sleep and Anxiety Medications. It is not advisable to combine anxiety,sleep and pain medications without talking with your primary care practitioner  Special Instructions: If you have smoked or chewed Tobacco  in the last 2 yrs please stop smoking, stop any regular Alcohol  and or any Recreational drug use.  Wear Seat belts while  driving.  Please note: You were cared for by a hospitalist during your hospital stay. Once you are discharged, your primary care physician will handle any further medical issues. Please note that NO REFILLS for any discharge medications will be authorized once you are discharged, as it is imperative that you return to your primary care physician (or establish a relationship with a primary care physician if you do not have one) for your post hospital discharge needs so that they can reassess your need for medications and monitor your lab values.     Head Injury, Adult There are many types of head injuries. Head injuries can be as minor as a bump, or they can be more severe. More severe head injuries include:  A jarring injury to the brain (concussion).  A bruise of the brain (contusion). This means there is bleeding in the brain that can cause swelling.  A cracked skull (skull fracture).  Bleeding in the brain that collects, clots, and forms a bump (hematoma).  After a head injury, you may need to be observed for a while in the emergency department or urgent care. Sometimes admission to the hospital is needed. After a head injury has happened, most problems occur within the first 24 hours, but side effects may occur up to 7-10 days after the injury. It is important to watch your condition for any changes. What are the causes? There are many possible causes of a head injury. A serious head injury may happen to someone who is in a car accident (motor vehicle collision). Other causes of major head injuries include bicycle or motorcycle accidents, sports injuries, and falls.  Risk factors This condition is more likely to occur in people who:  Drink a lot of alcohol or use drugs.  Are over the age of 5.  Are at risk for falls.  What are the symptoms? There are many possible symptoms of a head injury. Visible symptoms of a head injury include a bruise, bump, or bleeding at the site of the  injury. Other non-visible symptoms include:  Feeling sleepy or not being able to stay awake.  Passing out.  Headache.  Seizures.  Dizziness.  Confusion.  Memory problems.  Nausea or vomiting.  Other possible symptoms that may develop after the head injury include:  Poor attention and concentration.  Fatigue or tiring easily.  Irritability.  Being uncomfortable around bright lights or loud noises.  Anxiety or depression.  Disturbed sleep.  How is this diagnosed? This condition can usually be diagnosed based on your symptoms, a description of the injury, and a physical exam. You may also have imaging tests done, such as a CT scan or MRI. You will also be closely watched. How is this treated? Treatment for this condition depends on the severity and type of injury you have. The main goal of treatment is to prevent complications and allow the brain time to heal. For mild head injury, you may be sent home and treatment may include:  Observation. A responsible adult should stay with you for 24 hours after your injury and check on you often.  Physical rest.  Brain rest.  Pain medicines.  For severe brain injury, treatment may include:  Close observation. This includes hospitalization with frequent physical exams. You may need to go to a hospital that specializes in head injury.  Pain medicines.  Breathing support. This may include using a ventilator.  Managing the pressure inside the brain (intracranial pressure, or ICP). This may include: ? Monitoring the ICP. ? Giving medicines to decrease the ICP. ? Positioning you to decrease the ICP.  Medicine to prevent seizures.  Surgery to stop bleeding or to remove blood clots (craniotomy).  Surgery to remove part of the skull (decompressive craniectomy). This allows room for the brain to swell.  Follow these instructions at home: Activity  Rest as much as possible and avoid activities that are physically hard or  tiring.  Make sure you get enough sleep.  Limit activities that require a lot of thought or attention, such as: ? Watching TV. ? Playing memory games and puzzles. ? Job-related work or homework. ? Working on [employer], and texting.  Avoid activities that could cause another head injury, such as playing sports, until your health care provider approves. Having another head injury, especially before the first one has healed, can be dangerous.  Ask your health care provider when it is safe for you to return to your regular activities, including work or school. Ask your health care provider for a step-by-step plan for gradually returning to activities.  Ask your health care provider when you can drive, ride a bicycle, or use heavy machinery. Your ability to react may be slower after a brain injury. Never do these activities if you are dizzy.  Lifestyle  Do not drink alcohol until your health care provider approves, and avoid drug use. Alcohol and certain drugs may slow your recovery and can put you at risk of further injury.  If it is harder than usual to remember things, write them down.  If you are easily distracted, try to do one thing at a time.  Talk with family members or close friends when making important decisions.  Tell your friends, family, a trusted colleague, and work Freight forwarder about your injury, symptoms, and restrictions. Have them watch for any new or worsening problems.  General instructions  Take over-the-counter and prescription medicines only as told by your health care provider.  Have someone stay with you for 24 hours after your head injury. This person should watch you for any changes in your symptoms and be ready to seek medical help, as needed.  Keep all follow-up visits as told by your health care provider. This is important.  Prevention  Work on improving your balance and strength to avoid falls.  Wear a seatbelt when you are in a moving  vehicle.  Wear a helmet when riding a bicycle, skiing, or doing any other sport or activity that has a risk of injury.  Drink alcohol only in moderation.  Take safety measures in your home, such as: ? Removing clutter and tripping hazards from floors and stairways. ? Using grab bars in bathrooms and handrails by stairs. ? Placing non-slip mats on floors and in bathtubs. ? Improving lighting in dim areas. Get help right away if:  You have: ? A severe headache that is not helped by medicine. ? Trouble walking, have weakness in your arms and legs, or lose your balance. ? Clear or bloody fluid coming from your nose or ears. ? Changes in your vision. ? A seizure.  You vomit.  Your symptoms get worse.  Your speech is slurred.  You pass out.  You are sleepier and have trouble staying awake.  Your pupils change size. These symptoms may represent a serious problem that is an emergency. Do not wait to see if the symptoms will go away. Get medical help right away. Call your local emergency services (911 in the [place] Do not drive yourself to the hospital. This information is not intended to replace advice given to you by your health care provider. Make sure you discuss any questions you have with your health care provider. Document Released: 10/14/2005 Document Revised: 05/10/2016 Document Reviewed: 04/23/2016 Elsevier Interactive Patient Education  2017 [place].    Traumatic Brain Injury Traumatic brain injury (TBI) is an injury to your brain from a blow to your head (closed injury) or an object penetrating your skull and entering your brain (open injury). The severity of TBI varies significantly from one person to the next. Some TBIs cause you to pass out (lose consciousness) immediately and for a long period of time. Other TBIs do not cause any loss of consciousness. Symptoms of any type of TBI can be long lasting (chronic). TBI can interfere with memory and speech. TBI can also  cause chronic symptoms like headache or dizziness. What are the causes? TBI is caused by a closed or open injury. What increases the risk? You may be at higher risk for TBI if you:  Are 75 or older.  Are a man.  Are in a car accident.  Play a contact sport, especially football, hockey, or soccer.  Do not wear protective gear while playing sports.  Are in the [employer]  Are a victim of violence.  Abuse drugs or alcohol.  Have had a previous TBI.  What are the signs or symptoms? Signs and symptoms of TBI may occur right away or not until days, weeks, or months after the injury. They may last for days, weeks, months, or years. Symptoms may include:  Loss of consciousness.  Headache.  Confusion.  Fatigue.  Changes in sleep.  Dizziness.  Mood or personality changes.  Memory problems.  Nausea or vomiting or both.  Seizures.  Clumsiness.  Slurred speech.  Depression and anxiety.  Anger.  Inability to control emotions or actions (impulse control).  Loss of or dulling of your senses, such as hearing, vision, and touch. This can include: ? Blurred vision. ? Ringing in your ears.  How is this diagnosed? TBI may be diagnosed by a medical history and physical exam. Your health care provider will also do a neurologic exam to check your:  Reflexes.  Sensations.  Alertness.  Memory.  Vision.  Hearing.  Coordination.  Your health care provider will also do tests to diagnose the extent of your TBI, such as a CT scan of your brain and skull. One way to determine the severity of your TBI is with a scoring system called the Glasgow Coma Scale (GCS). It measures eye opening, motor response, and verbal response. The higher the score, the milder the TBI. Your TBI may be described as mild, moderate, or severe:  Mild TBI (concussion). ? Symptoms of mild TBI usually go away on their own. This can take weeks or months, depending on the type of concussion. ? Your  GCS will be 13-15. ? Your brain CT scan will be normal. ? You may or may not have a short hospital stay.  Moderate TBI. ? Your GCS will be 9-12. ? Your brain CT scan will be abnormal. ? You will likely need a short hospital stay.  Severe TBI. ? Your GCS will be 3-8. ? Your brain CT scan will be abnormal. ? You may need a long stay in the hospital.  How is this treated? Emergency treatment of TBI may involve measures to maintain a clear airway and stable blood pressure. Brain surgery may be needed to:  Remove a blood clot.  Repair bleeding.  Remove an object that has penetrated the brain, such as a skull fragment or a bullet.  Other treatments depend on your chronic signs and symptoms. These treatments include the following types of therapy:  Physical.  Occupational.  Speech and language.  Mental health.  Social support.  Follow these instructions at home:  Carefully follow all your health care provider's instructions.  Work closely with all your therapists, if necessary.  Take medicines only as directed by your health care provider. Do not take aspirin or other anti-inflammatory medicines such as ibuprofen or naproxen unless approved by your health care provider.  Do not abuse illegal drugs.  Limit alcohol intake to no more than 1 drink per day for nonpregnant women and 2 drinks per day for men. One drink equals 12 ounces of beer, 5 ounces of wine, or 1 ounces of hard liquor.  Avoid any situation where there is potential for another head injury, such as football, hockey, soccer, basketball, martial arts, downhill snow sports, and horseback riding. Do not do these activities until your health care provider approves.  Rest. Rest helps the brain to heal. Make sure you: ? Get plenty of sleep at night. Avoid staying up late at night. ? Keep the same bedtime hours on weekends and weekdays. ? Rest during the day. Take daytime naps or rest breaks when you feel  tired.  Avoid excessive visual stimulation while recovering from a TBI. This includes work on the computer, watching TV, and reading.  Try to avoid activities that cause physical or mental stress. Stay home from work or school  as directed by your health care provider.  Make lists to plan your day and help your memory.  Do not drive, ride a bicycle, or operate heavy machinery until your health care provider approves.  Seek support from friends and family.  Keep all follow-up visits as directed by your health care provider. This is important.  Watch your symptoms and tell others to do the same. Complications sometimes occur after a TBI. How is this prevented?  Wear a helmet while biking, skiing, skateboarding, skating, or doing similar activities. Wear your seatbelt while driving.  Do not abuse alcohol or drugs.  Do not drink and drive.  Prevent falls at home by: ? Removing clutter and tripping hazards, including loose rugs, from floors and stairways. ? Using grab bars in bathrooms and handrails by stairs. ? Placing nonslip mats on floors and in bathtubs. ? Improving lighting in dim areas. Contact a health care provider if: Seek medical care if you have any of the following symptoms for more than two weeks after your injury:  Chronic headaches.  Dizziness or balance problems.  Nausea.  Vision problems.  Increased sensitivity to noise or light.  Depression or mood swings.  Anxiety or irritability.  Memory problems.  Difficulty concentrating or paying attention.  Sleep problems.  Feeling tired all the time.  Get help right away if:  You have confusion or unusual drowsiness.  It is difficult to wake you up.  You have nausea or persistent, forceful vomiting.  You feel like you are moving when you are not (vertigo). Your eyes may move rapidly back and forth.  You have convulsions or faint.  You have severe, persistent headaches that are not relieved by  medicine.  You cannot use your arms or legs normally.  One of your pupils is larger than the other.  You have clear or bloody discharge from your nose or ears.  Your problems are getting worse, not better. This information is not intended to replace advice given to you by your health care provider. Make sure you discuss any questions you have with your health care provider. Document Released: 10/04/2002 Document Revised: 06/16/2016 Document Reviewed: 01/27/2014 Elsevier Interactive Patient Education  2018 [place] With Traumatic Brain Injury Traumatic brain injury (TBI) is an injury to the brain that results from a hard, direct hit to the head. It also results from whiplash or a direct blow to the head by an object. TBI can be mild, moderate, or severe. What do I need to know about this condition? Symptoms of any type of TBI can be long lasting (chronic). Depending on the area of the brain that is affected, a TBI can interfere with vision, memory, concentration, speech, balance, sense of touch, and sleep. TBI can also cause chronic symptoms like headache or dizziness. A mild TBI is also known as a concussion. A concussion usually involves being knocked-out (losing consciousness) for a short time or not at all, and results in less brain damage. A moderate or severe TBI involves losing consciousness for an extended period of time. This can result in more serious damage to the brain, such as memory loss after the injury. What do I need to know about my loved one's recovery? Traumatic brain injuries affect different people in different ways. It is important to understand that recovery is different for each person, and may take time. Mild injury Many people with a mild brain injury recover quickly and return to their normal activities and abilities.  However, some people may continue to experience problems associated with their brain injury. This can cause frustration,  anger, and moodiness for the person and their loved ones, especially if they expected a normal and complete recovery. Moderate or severe injury After a moderate or severe injury, there is usually an initial recovery period that may last up to several weeks. During this time, your loved one may stay in the hospital or a rehabilitation center. Depending on your loved one's injury, age, recovery, and overall health, he or she may:  Be allowed to return home or enter a long-term care facility.  Need to continue rehabilitation with occupational, physical, and speech therapists to help restore their mobility and their ability to process information, speak, and do daily activities.  Have changes to mood and personality.  Experience changes to sleep, energy levels, appetite, balance, bladder control, and vision.  Have long-term health complications, such as seizures, headaches, or chronic pain.  Need to take an extended time off work or school. Some people are not able to return to these activities after their injury.  How can I support my friend or family member? General help  Offer assistance with household chores or other daily tasks. Cook or make "freezer meals" that can be reheated.  Help your loved one establish a daily routine. This may include setting reminders or having a shared day planner or calendar.  Encourage rest. Your loved one may need frequent breaks during social situations or other activities.  Be patient. Your loved one may take longer to complete tasks and process information.  When giving instructions, give only one instruction at a time or make lists. Multitasking can be difficult for a person with a TBI.  Do not set expectations about your loved one's recovery. Medical appointments  Gently remind your loved one of tasks or appointments if he or she is forgetful.  Provide transportation to and from appointments.  Attend rehabilitation appointments with your loved  one. Help with exercises at home. Preventing falls Take steps to prevent falls in your loved one's home. This may include:  Installing grab bars in the bathroom or handrails on stairs.  Using night lights in the bedroom, bathroom, or hallways.  Securing or removing rugs and cords in walkways.  Managing finances  Help manage finances. Talk with a Education officer, museum or legal professional if: ? Your loved one is unable to return to work and is in need of financial help. ? You need help paying medical bills. ? You need to establish guardianship over finances. ? You need help with estate planning. How can I care for myself? Lifestyle  Do not use drugs.  Avoid or limit alcohol use. Limit alcohol intake to no more than 1 drink per day for nonpregnant women and 2 drinks per day for men. One drink equals 12 ounces of beer, 5 ounces of wine, or 1 ounces of hard liquor.  Rest. Try to get 7-9 hours of uninterrupted sleep each night.  Eat a balanced diet that includes fresh fruits and vegetables, whole grains, lean proteins, and low-fat dairy.  Exercise for at least 30 minutes on 5 or more days each week.  Find ways to manage stress. This may include: ? Deep breathing, yoga, or meditation. ? Spending time outdoors. ? Journaling. Finding support  Ask for help. Take a break if you are the primary caregiver to your loved one.  Spend time with supportive people.  Join a support group with other caregivers or family members  of people with TBI.  If you experience new or worsening depression or anxiety, seek counseling from a mental health professional. Where to find more information:  Learn more about TBI and find support through: ? Brainline: www.brainline.org ? Brain Injury Association of America: www.biausa.[place] and their families: ? Defense and [place]: dvbic.dcoe.mil ? Department of [place]:  (757) 600-9856 Summary  A Traumatic brain injury (TBI) is an injury to the brain that can interfere with vision, memory, concentration, speech, balance, sense of touch, and sleep. TBI can also cause chronic symptoms like headache or dizziness.  Traumatic brain injuries affect different people in different ways. It is important to understand that recovery takes time and looks different for each person.  You can support your loved one with a TBI by assisting with daily tasks, setting reminders, encouraging rest, and being patient.  It is important to take care of yourself as a caregiver. Take time to exercise, manage stress, spend time with supportive people, and rest. Ask for help, find a support group, and meet with a mental health counselor, if needed. This information is not intended to replace advice given to you by your health care provider. Make sure you discuss any questions you have with your health care provider. Document Released: 10/11/2016 Document Revised: 10/11/2016 Document Reviewed: 10/11/2016 Elsevier Interactive Patient Education  [employer].

## 2017-12-24 NOTE — Discharge Summary (Signed)
Physician Discharge Summary  Jenna Smith ZOX:[id number] DOB: 06-24-1951 DOA: 12/22/2017  PCP: Jenna Ip, DO  Admit date: 12/22/2017 Discharge date: 12/24/2017  Admitted From: Home  Disposition: Home  Recommendations for Outpatient Follow-up:  1. Follow up with PCP in 1 weeks 2. Follow up with neurosurgeon in 2 weeks for recheck 3. Please obtain BMP/CBC in one week 4. Please follow up on the following pending results: Final urine and blood culture results  Discharge Condition: STABLE   CODE STATUS: FULL    Brief Hospitalization Summary: Please see all hospital notes, images, labs for full details of the hospitalization. HPI:    Jenna Smith  is a 67 y.o. female, w recent surgery for RTKA apparently was at [facility] and had a syncopal episode,  ?witnessess saw her twitching according to ED physician after hitting her head  However no tongue lac or incontinence. Pt was brought to ED for evaluation.   In ED,  CXR IMPRESSION: No active cardiopulmonary disease.  R knee IMPRESSION: 1. No acute findings. 2. Prior right total knee arthroplasty.  CT brain IMPRESSION: 1. Positive for combination nondisplaced right lateral skull fracture, and small curvilinear acute hemorrhage in the contralateral left hemisphere subcortical white matter. Perhaps this represents an unusual Coup-Contrecoup injury with white matter shear hemorrhage, but consider also an acute hemorrhagic lacunar infarct. Follow-up brain MRI (noncontrast should suffice) may be valuable. 2. Subtle nondisplaced right lateral temporal/parietal skull fracture with overlying broad-based scalp hematoma.  MRI/ MRA brain IMPRESSION: 1. Unchanged size of intraparenchymal hemorrhage in the left frontal operculum white matter. No new mass effect or new site of hemorrhage. 2. No emergent large vessel occlusion. No aneurysm or AVM.   Neurosurgery Jenna Smith apparently consulted by ED, and said that  surgery not warranted and would repeat CT brain tomorrow, please call if any change/ worsening but otherwise can f/u as outpatient   Na 137, K 3.3,  Bun 25, Creatinine 0.77 Ast 14, Alt 13 Wbc 12.2, Hgb 11.4, Plt 274  UDS opiate positive Urinalysis Wbc 6-30, Rbc 6-30  Pt will be admitted for syncope and uti, and hypokalemia  Brief Admission Hx: MargarettaWoottonis a66 y.o.female,w recent surgery for RTKA apparently was at [facility] and had a syncopal episode, ?witnessess saw her twitching according to ED physician after hitting her head However no tongue lac or incontinence. Pt was brought to ED for evaluation.  The patient feels like this is all medication related as she was taking prednisone and opioids at the same time.  She now has a small intraparenchymal brain bleed secondary to the fall and head injury.  Neurosurgery was consulted in the ED and recommended repeat CT scan testing the following day.  MDM/Assessment & Plan:   1. Syncopal episode-witnessed in a [employer].  The patient is being worked up and receiving a cardiac echocardiogram and carotid ultrasound.  Trending troponins which have been negative.  EEG is normal.  There was some concern for possible seizure however patient believes that this is all secondary to medications as she was taking prednisone and opioids which she normally does not take any medications at all.  Neurology has been consulted and Jenna Smith felt that it was likely medication related.  Repeat CT was stable. 2. UTI-Cipro ordered and following urine cultures. Discharge on 5 days of macrobid. Follow up final culture with PCP.   3. Intraparenchymal hemorrhage in the brain- CT brain repeat testing stable.  The patient will need to have outpatient neurosurgery follow-up with Dr. Dutch Smith  who was called about her when she was seen in ED. Pt ws counseled and said that she would be sure to call and make follow up appointment with  neurosurgeon. 4. Hypokalemia-repleted.   5. Anemia, unspecified- repeated CBC stable.  DVT prophylaxis: SCDs Code Status: Full Family Communication: Husband Disposition Plan: Home   Consultants:  Neurology telephone call neurosurgery  Procedures:  Echocardiogram, carotid Dopplers, CT scan, MRI  Echo Study Conclusions  - Left ventricle: The cavity size was normal. Wall thickness was   normal. Systolic function was normal. The estimated ejection   fraction was in the range of 60% to 65%. Wall motion was normal;   there were no regional wall motion abnormalities. Left   ventricular diastolic function parameters were normal. - Mitral valve: There was mild regurgitation. - Atrial septum: No defect or patent foramen ovale was identified. - Systemic veins: IVC is dilated with normal respiratory variation.   Estimated CVP 8 mmHg.  Discharge Diagnoses:  Principal Problem:   Syncope Active Problems:   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HCC)   Hypokalemia   Anemia  Discharge Instructions: Discharge Instructions    Call MD for:  difficulty breathing, headache or visual disturbances   Complete by:  As directed    Call MD for:  extreme fatigue   Complete by:  As directed    Call MD for:  persistant dizziness or light-headedness   Complete by:  As directed    Call MD for:  persistant nausea and vomiting   Complete by:  As directed    Increase activity slowly   Complete by:  As directed      Allergies as of 12/24/2017      Reactions   Indomethacin Swelling   Face swells up   Latex Hives   Neosporin [neomycin-bacitracin Zn-polymyx] Hives   Penicillins Hives   Has patient had a PCN reaction causing immediate rash, facial/tongue/throat swelling, SOB or lightheadedness with hypotension: no Has patient had a PCN reaction causing severe rash involving mucus membranes or skin necrosis: no Has patient had a PCN reaction that required hospitalization: no Has patient had a  PCN reaction occurring within the last 10 years: no If all of the above answers are "NO", then may proceed with Cephalosporin use.      Medication List    STOP taking these medications   aspirin EC 325 MG tablet   HYDROcodone-acetaminophen 5-325 MG tablet Commonly known as:  NORCO/VICODIN   HYDROcodone-homatropine 5-1.5 MG/5ML syrup Commonly known as:  HYCODAN   methocarbamol 500 MG tablet Commonly known as:  ROBAXIN   predniSONE 5 MG (21) Tbpk tablet Commonly known as:  STERAPRED UNI-PAK 21 TAB     TAKE these medications   cetirizine 10 MG tablet Commonly known as:  ZYRTEC Take 10 mg by mouth daily as needed for allergies.   nitrofurantoin (macrocrystal-monohydrate) 100 MG capsule Commonly known as:  MACROBID Take 1 capsule (100 mg total) by mouth 2 (two) times daily for 5 days.      Follow-up Information    Julio Sicks, MD. Schedule an appointment as soon as possible for a visit in 2 week(s).   Specialty:  Neurosurgery Why:  Hospital Follow Up  Contact information: [address] [place] [address] [phone]        Jenna Ip, DO. Schedule an appointment as soon as possible for a visit in 1 week(s).   Specialty:  Family Medicine Why:  Hospital Follow Up Contact information:  [address] [place] [address] [phone]          Allergies  Allergen Reactions  . Indomethacin Swelling    Face swells up  . Latex Hives  . Neosporin [Neomycin-Bacitracin Zn-Polymyx] Hives  . Penicillins Hives    Has patient had a PCN reaction causing immediate rash, facial/tongue/throat swelling, SOB or lightheadedness with hypotension: no Has patient had a PCN reaction causing severe rash involving mucus membranes or skin necrosis: no Has patient had a PCN reaction that required hospitalization: no Has patient had a PCN reaction occurring within the last 10 years: no If all of the above answers are "NO", then may proceed with  Cephalosporin use.    Allergies as of 12/24/2017      Reactions   Indomethacin Swelling   Face swells up   Latex Hives   Neosporin [neomycin-bacitracin Zn-polymyx] Hives   Penicillins Hives   Has patient had a PCN reaction causing immediate rash, facial/tongue/throat swelling, SOB or lightheadedness with hypotension: no Has patient had a PCN reaction causing severe rash involving mucus membranes or skin necrosis: no Has patient had a PCN reaction that required hospitalization: no Has patient had a PCN reaction occurring within the last 10 years: no If all of the above answers are "NO", then may proceed with Cephalosporin use.      Medication List    STOP taking these medications   aspirin EC 325 MG tablet   HYDROcodone-acetaminophen 5-325 MG tablet Commonly known as:  NORCO/VICODIN   HYDROcodone-homatropine 5-1.5 MG/5ML syrup Commonly known as:  HYCODAN   methocarbamol 500 MG tablet Commonly known as:  ROBAXIN   predniSONE 5 MG (21) Tbpk tablet Commonly known as:  STERAPRED UNI-PAK 21 TAB     TAKE these medications   cetirizine 10 MG tablet Commonly known as:  ZYRTEC Take 10 mg by mouth daily as needed for allergies.   nitrofurantoin (macrocrystal-monohydrate) 100 MG capsule Commonly known as:  MACROBID Take 1 capsule (100 mg total) by mouth 2 (two) times daily for 5 days.       Procedures/Studies: Dg Chest 2 View  Result Date: 12/22/2017 CLINICAL DATA:  Syncope. EXAM: CHEST  2 VIEW COMPARISON:  Radiographs of November 12, 2017. FINDINGS: The heart size and mediastinal contours are within normal limits. Both lungs are clear. No pneumothorax or pleural effusion is noted. The visualized skeletal structures are unremarkable. IMPRESSION: No active cardiopulmonary disease. Electronically Signed   By: Lupita Raider, M.D.   On: 12/22/2017 13:58   Ct Head Wo Contrast  Result Date: 12/23/2017 CLINICAL DATA:  67 year old female. Follow-up intracranial hemorrhage. Feeling  better today. Subsequent encounter. EXAM: CT HEAD WITHOUT CONTRAST TECHNIQUE: Contiguous axial images were obtained from the base of the skull through the vertex without intravenous contrast. COMPARISON:  CT and MR 12/22/2017. FINDINGS: Brain: Anterior left frontal opercular 1.7 x 0.4 x 0.7 cm hematoma with minimal amount of surrounding edema relatively similar to yesterday's exam. No new intracranial hemorrhage identified. No intracranial mass lesion noted on this unenhanced exam. No CT evidence of large acute infarct. Vascular: No acute abnormality. Skull: Subtle nondisplaced right calvarial fracture noted on prior exam not as well delineated on present exam. Sinuses/Orbits: No acute orbital abnormality. Visualized paranasal sinuses mastoid air cells and middle ear cavities are clear. Other: Right parietal scalp hematoma once again noted. IMPRESSION: Anterior left frontal opercular 1.7 x 0.4 x 0.7 cm hematoma with minimal amount of surrounding edema relatively similar to yesterday's exam. No new  intracranial hemorrhage identified. Electronically Signed   By: Lacy Duverney M.D.   On: 12/23/2017 12:41   Ct Head Wo Contrast  Addendum Date: 12/22/2017   ADDENDUM REPORT: 12/22/2017 15:54 ADDENDUM: Study discussed by telephone with Dr. Vanetta Mulders on 12/22/2017 at 1543 hrs. Electronically Signed   By: Odessa Fleming M.D.   On: 12/22/2017 15:54   Result Date: 12/22/2017 CLINICAL DATA:  67 year old female with witnessed syncope, fall with head injury and subsequent seizure activity of [employer] today. EXAM: CT HEAD WITHOUT CONTRAST TECHNIQUE: Contiguous axial images were obtained from the base of the skull through the vertex without intravenous contrast. COMPARISON:  Head CT without contrast 01/16/2016. FINDINGS: Brain: There is a curvilinear area of abnormal hyperdensity along the left anterior operculum subcortical white matter, new since 2017 and best demonstrated on series 4, image 27. No surrounding edema. No  regional mass effect. The overlying cortex appears to remain normal. No other intracranial hemorrhage identified. No extra-axial blood or collection. No intracranial mass effect. No midline shift. Bilateral gray-white matter differentiation remains normal. No acute cortically based infarct. No ventriculomegaly. Vascular: Dominant distal left vertebral artery. No suspicious intracranial vascular hyperdensity. Skull: Subtle nondisplaced linear right lateral skull fracture best seen on series 5, image 7. Stable skull elsewhere with no other acute osseous abnormality identified. Sinuses/Orbits: Clear. Resolved sinus bubbly opacity seen in 2017. Tympanic cavities and mastoids remain clear. Other: Broad-based right superior convexity scalp hematoma measuring up to 7 millimeters in thickness. Suspicion of underlying subtle nondisplaced skull fracture (series 3, image 47 and sagittal image 7). Other scalp soft tissues, visible orbits soft tissues and visible deep soft tissue spaces of the face appear normal. IMPRESSION: 1. Positive for combination nondisplaced right lateral skull fracture, and small curvilinear acute hemorrhage in the contralateral left hemisphere subcortical white matter. Perhaps this represents an unusual Coup-Contrecoup injury with white matter shear hemorrhage, but consider also an acute hemorrhagic lacunar infarct. Follow-up brain MRI (noncontrast should suffice) may be valuable. 2. Subtle nondisplaced right lateral temporal/parietal skull fracture with overlying broad-based scalp hematoma. Electronically Signed: By: Odessa Fleming M.D. On: 12/22/2017 15:37   Mr Maxine Glenn Head Wo Contrast  Result Date: 12/22/2017 CLINICAL DATA:  Altered mental status. Fall with intracranial hemorrhage EXAM: MRI HEAD WITHOUT CONTRAST MRA HEAD WITHOUT CONTRAST TECHNIQUE: Multiplanar, multiecho pulse sequences of the brain and surrounding structures were obtained without intravenous contrast. Angiographic images of the head were  obtained using MRA technique without contrast. COMPARISON:  None. Head CT 12/22/2017 FINDINGS: MRI HEAD FINDINGS Brain: The midline structures are normal. Crescentic focus of hemorrhage within the subcortical left frontal operculum white matter. No evidence of acute infarct. The brain parenchymal signal is normal. No mass lesion. No chronic microhemorrhage or cerebral amyloid angiopathy. No hydrocephalus, age advanced atrophy or lobar predominant volume loss. No dural abnormality or extra-axial collection. Skull and upper cervical spine: The visualized skull base, calvarium, upper cervical spine and extracranial soft tissues are normal. No marrow edema. Sinuses/Orbits: No fluid levels or advanced mucosal thickening. No mastoid effusion. Normal orbits. MRA HEAD FINDINGS Intracranial internal carotid arteries: Normal. Anterior cerebral arteries: Normal. Middle cerebral arteries: Normal. Posterior communicating arteries: Present bilaterally. Posterior cerebral arteries: Normal. Basilar artery: Normal. Vertebral arteries: Left dominant. Normal. Superior cerebellar arteries: Normal. Inferior cerebellar arteries are poorly visualized. IMPRESSION: 1. Unchanged size of intraparenchymal hemorrhage in the left frontal operculum white matter. No new mass effect or new site of hemorrhage. 2. No emergent large vessel occlusion.  No aneurysm or AVM. Electronically  Signed   By: Deatra Robinson M.D.   On: 12/22/2017 17:17   Mr Brain Wo Contrast (neuro Protocol)  Result Date: 12/22/2017 CLINICAL DATA:  Altered mental status. Fall with intracranial hemorrhage EXAM: MRI HEAD WITHOUT CONTRAST MRA HEAD WITHOUT CONTRAST TECHNIQUE: Multiplanar, multiecho pulse sequences of the brain and surrounding structures were obtained without intravenous contrast. Angiographic images of the head were obtained using MRA technique without contrast. COMPARISON:  None. Head CT 12/22/2017 FINDINGS: MRI HEAD FINDINGS Brain: The midline structures are  normal. Crescentic focus of hemorrhage within the subcortical left frontal operculum white matter. No evidence of acute infarct. The brain parenchymal signal is normal. No mass lesion. No chronic microhemorrhage or cerebral amyloid angiopathy. No hydrocephalus, age advanced atrophy or lobar predominant volume loss. No dural abnormality or extra-axial collection. Skull and upper cervical spine: The visualized skull base, calvarium, upper cervical spine and extracranial soft tissues are normal. No marrow edema. Sinuses/Orbits: No fluid levels or advanced mucosal thickening. No mastoid effusion. Normal orbits. MRA HEAD FINDINGS Intracranial internal carotid arteries: Normal. Anterior cerebral arteries: Normal. Middle cerebral arteries: Normal. Posterior communicating arteries: Present bilaterally. Posterior cerebral arteries: Normal. Basilar artery: Normal. Vertebral arteries: Left dominant. Normal. Superior cerebellar arteries: Normal. Inferior cerebellar arteries are poorly visualized. IMPRESSION: 1. Unchanged size of intraparenchymal hemorrhage in the left frontal operculum white matter. No new mass effect or new site of hemorrhage. 2. No emergent large vessel occlusion.  No aneurysm or AVM. Electronically Signed   By: Deatra Robinson M.D.   On: 12/22/2017 17:17   Dg Knee Complete 4 Views Right  Result Date: 12/22/2017 CLINICAL DATA:  Right knee pain after fall at [employer] EXAM: RIGHT KNEE - COMPLETE 4+ VIEW COMPARISON:  07/17/2017 FINDINGS: Previous right total knee arthroplasty. No evidence of fracture, dislocation, or joint effusion. No evidence of arthropathy or other focal bone abnormality. Soft tissues are unremarkable. IMPRESSION: 1. No acute findings. 2. Prior right total knee arthroplasty. Electronically Signed   By: Signa Kell M.D.   On: 12/22/2017 15:24      Subjective: Pt says she feels much better she wants to go home, she has been ambulating and has no other complaints. NO further episodes  of syncope and no seizure activity.   Discharge Exam: Vitals:   12/23/17 2154 12/24/17 0626  BP: (!) 144/71 134/64  Pulse: 70 78  Resp:    Temp: 98.2 F (36.8 C) 98.9 F (37.2 C)  SpO2: 100% 98%   Vitals:   12/23/17 1900 12/23/17 2000 12/23/17 2154 12/24/17 0626  BP:  136/79 (!) 144/71 134/64  Pulse:   70 78  Resp: 16 13    Temp:  98.2 F (36.8 C) 98.2 F (36.8 C) 98.9 F (37.2 C)  TempSrc:  Oral Oral Oral  SpO2:   100% 98%  Weight:   78 kg (171 lb 15.3 oz) 78.2 kg (172 lb 6.4 oz)  Height:       General: Pt is alert, awake, not in acute distress Cardiovascular: RRR, S1/S2 +, no rubs, no gallops Respiratory: CTA bilaterally, no wheezing, no rhonchi Abdominal: Soft, NT, ND, bowel sounds + Extremities: no edema, no cyanosis Neurological: nonfocal   The results of significant diagnostics from this hospitalization (including imaging, microbiology, ancillary and laboratory) are listed below for reference.     Microbiology: Recent Results (from the past 240 hour(s))  Rapid Strep Screen (Not at [facility])     Status: None   Collection Time: 12/18/17 12:33 PM  Result Value Ref  Range Status   Strep Gp A Ag, IA W/Reflex Negative Negative Final  Culture, Group A Strep     Status: None   Collection Time: 12/18/17 12:33 PM  Result Value Ref Range Status   Strep A Culture CANCELED      Comment: Test not performed  Result canceled by the ancillary.   MRSA PCR Screening     Status: None   Collection Time: 12/22/17 10:37 PM  Result Value Ref Range Status   MRSA by PCR NEGATIVE NEGATIVE Final    Comment:        The GeneXpert MRSA Assay (FDA approved for NASAL specimens only), is one component of a comprehensive MRSA colonization surveillance program. It is not intended to diagnose MRSA infection nor to guide or monitor treatment for MRSA infections. Performed at [facility], [address] [place], [place] [address]      Labs: BNP (last 3 results) No results for  input(s): BNP in the last 8760 hours. Basic Metabolic Panel: Recent Labs  Lab 12/22/17 1326 12/22/17 2330 12/23/17 0459  NA 137  --  140  K 3.3* 3.6 3.8  CL 100*  --  104  CO2 27  --  27  GLUCOSE 101*  --  85  BUN 25*  --  15  CREATININE 0.77  --  0.82  CALCIUM 9.1  --  8.9  MG 2.1  --   --    Liver Function Tests: Recent Labs  Lab 12/22/17 1326 12/23/17 0459  AST 14* 12*  ALT 13* 13*  ALKPHOS 114 115  BILITOT 0.2* 0.2*  PROT 6.9 6.3*  ALBUMIN 3.5 3.2*   Recent Labs  Lab 12/22/17 1326  LIPASE 19   No results for input(s): AMMONIA in the last 168 hours. CBC: Recent Labs  Lab 12/22/17 1326 12/23/17 0459  WBC 12.2* 7.9  NEUTROABS 9.8*  --   HGB 11.4* 11.0*  HCT 36.0 35.0*  MCV 86.5 87.5  PLT 274 271   Cardiac Enzymes: Recent Labs  Lab 12/22/17 2336 12/23/17 0459 12/23/17 1028  TROPONINI 0.08* 0.05* 0.04*   BNP: Invalid input(s): POCBNP CBG: No results for input(s): GLUCAP in the last 168 hours. D-Dimer No results for input(s): DDIMER in the last 72 hours. Hgb A1c No results for input(s): HGBA1C in the last 72 hours. Lipid Profile No results for input(s): CHOL, HDL, LDLCALC, TRIG, CHOLHDL, LDLDIRECT in the last 72 hours. Thyroid function studies No results for input(s): TSH, T4TOTAL, T3FREE, THYROIDAB in the last 72 hours.  Invalid input(s): FREET3 Anemia work up No results for input(s): VITAMINB12, FOLATE, FERRITIN, TIBC, IRON, RETICCTPCT in the last 72 hours. Urinalysis    Component Value Date/Time   COLORURINE YELLOW 12/22/2017 1430   APPEARANCEUR CLOUDY (A) 12/22/2017 1430   LABSPEC 1.029 12/22/2017 1430   PHURINE 5.0 12/22/2017 1430   GLUCOSEU NEGATIVE 12/22/2017 1430   HGBUR MODERATE (A) 12/22/2017 1430   BILIRUBINUR SMALL (A) 12/22/2017 1430   KETONESUR NEGATIVE 12/22/2017 1430   PROTEINUR 100 (A) 12/22/2017 1430   NITRITE NEGATIVE 12/22/2017 1430   LEUKOCYTESUR NEGATIVE 12/22/2017 1430   Sepsis Labs Invalid input(s):  PROCALCITONIN,  WBC,  LACTICIDVEN Microbiology Recent Results (from the past 240 hour(s))  Rapid Strep Screen (Not at [facility])     Status: None   Collection Time: 12/18/17 12:33 PM  Result Value Ref Range Status   Strep Gp A Ag, IA W/Reflex Negative Negative Final  Culture, Group A Strep     Status: None  Collection Time: 12/18/17 12:33 PM  Result Value Ref Range Status   Strep A Culture CANCELED      Comment: Test not performed  Result canceled by the ancillary.   MRSA PCR Screening     Status: None   Collection Time: 12/22/17 10:37 PM  Result Value Ref Range Status   MRSA by PCR NEGATIVE NEGATIVE Final    Comment:        The GeneXpert MRSA Assay (FDA approved for NASAL specimens only), is one component of a comprehensive MRSA colonization surveillance program. It is not intended to diagnose MRSA infection nor to guide or monitor treatment for MRSA infections. Performed at [facility], [address], [place], [place] [address]    Time coordinating discharge:   SIGNED:  Standley Dakins, MD  [facility] 12/24/2017, 10:35 AM Pager [phone]  If 7PM-7AM, please contact night-coverage [email or web address] Password [email or web address]

## 2017-12-26 ENCOUNTER — Encounter: Payer: BLUE CROSS/BLUE SHIELD | Admitting: Physical Therapy

## 2017-12-29 ENCOUNTER — Encounter: Payer: Self-pay | Admitting: Physical Therapy

## 2017-12-29 ENCOUNTER — Ambulatory Visit: Payer: BLUE CROSS/BLUE SHIELD | Attending: Orthopedic Surgery | Admitting: Physical Therapy

## 2017-12-29 DIAGNOSIS — R600 Localized edema: Secondary | ICD-10-CM

## 2017-12-29 DIAGNOSIS — G8929 Other chronic pain: Secondary | ICD-10-CM | POA: Diagnosis present

## 2017-12-29 DIAGNOSIS — M6281 Muscle weakness (generalized): Secondary | ICD-10-CM

## 2017-12-29 DIAGNOSIS — M25561 Pain in right knee: Secondary | ICD-10-CM | POA: Insufficient documentation

## 2017-12-29 NOTE — Therapy (Signed)
Right knee pain 02/26/2016    Raylan Hanton P, PTA 12/29/2017, 2:46 PM  [facility] [place], [place], [address] Phone: [phone]   Fax:  [phone]  Name:  Jenna Smith MRN: [id number] Date of Birth: 09/10/1951  Right knee pain 02/26/2016    Raylan Hanton P, PTA 12/29/2017, 2:46 PM  [facility] [place], [place], [address] Phone: [phone]   Fax:  [phone]  Name:  Jenna Smith MRN: [id number] Date of Birth: 09/10/1951  [facility] [place], [place], [address] Phone: [phone]   Fax:  [phone]  Physical Therapy Treatment  Patient Details  Name: [name] MRN: [id number] Date of Birth: 05-31-1951 Referring Provider: [name] MD.   Encounter Date: 12/29/2017  PT End of Session - 12/29/17 1428    Visit Number  6    Number of Visits  12    Date for PT Re-Evaluation  01/05/18    Authorization Type  FOTO AT LEAST EVERY 5TH VISIT.    PT Start Time  1346    PT Stop Time  1444    PT Time Calculation (min)  58 min    Activity Tolerance  Patient tolerated treatment well    Behavior During Therapy  WFL for tasks assessed/performed       Past Medical History:  Diagnosis Date  . Allergy   . Anemia 12/22/2017    Past Surgical History:  Procedure Laterality Date  . BREAST SURGERY Bilateral May 2001   Breast reduction  . COSMETIC SURGERY  September 2012   Face lift  . TONSILLECTOMY    . 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   Procedure: RIGHT TOTAL KNEE ARTHROPLASTY;  Surgeon: [name], MD;  Location: [facility];  Service: Orthopedics;  Laterality: Right;    There were no vitals filed for this visit.  Subjective Assessment - 12/29/17 1354    Subjective  Patient reported being in hospital after a reaction to medication and had a fall, better upon arrival.    Limitations  Walking    How long can you walk comfortably?  Short distances around house with straight cane.    Patient Stated Goals  Walk with pain or using a cane.    Currently in Pain?  Yes    Pain Score  1     Pain Location  Knee    Pain Orientation  Right    Pain Descriptors / Indicators  Discomfort    Pain Type  Surgical pain    Pain Onset  More than a month ago    Pain Frequency  Intermittent    Aggravating Factors   bening knee    Pain Relieving Factors  rest         [facility] PT Assessment - 12/29/17 0001      AROM   AROM Assessment Site  Knee     Right/Left Knee  Right    Right Knee Extension  -11    Right Knee Flexion  96      PROM   PROM Assessment Site  Knee    Right/Left Knee  Right    Right Knee Extension  -7    Right Knee Flexion  110                  [facility] Adult PT Treatment/Exercise - 12/29/17 0001      Lumbar Exercises: Aerobic   Nustep  87min L3 UE/LE activity      Electrical Stimulation   Electrical Stimulation Location  R knee IFC 1-10 Hz x 15 min    Electrical Stimulation Goals  Edema;Pain      Vasopneumatic   Number Minutes Vasopneumatic   15 minutes    Vasopnuematic Location   Knee    Vasopneumatic Pressure  Low      Manual Therapy   Manual Therapy  Passive ROM    Passive ROM  manual PROM for right knee flexion/ext with hold end range

## 2017-12-30 NOTE — Progress Notes (Deleted)
Subjective: CC: hospital follow up HPI: Jenna Smith is a 67 y.o. female presenting to clinic today for:  1. Syncope Patient was evaluated/admitted to the hospital for syncope.  She had a small right sided skull fracture with associated hemorrhage.  Neurology was consulted for possible seizure-like activity preceding syncopal episode.  She had an EEG that was considered within normal limits.  She had an echocardiogram which revealed normal systolic function with an EF of 60-65%.  Mild mitral valve regurgitation noted but otherwise unremarkable.  Syncope was thought to be medication related.  He was advised that she follow-up with neurosurgery, Dr. Trenton Gammon, on an outpatient basis.  Additionally, she was noted to have urinary tract infection.  Urine culture actually grew less than 10,000 colonies.  At discharge, she was prescribed Cipro for 5 days to cover for UTI.  ROS: Per HPI  Past Medical History:  Diagnosis Date  . Allergy   . Anemia 12/22/2017   Allergies  Allergen Reactions  . Indomethacin Swelling    Face swells up  . Latex Hives  . Neosporin [Neomycin-Bacitracin Zn-Polymyx] Hives  . Penicillins Hives    Has patient had a PCN reaction causing immediate rash, facial/tongue/throat swelling, SOB or lightheadedness with hypotension: no Has patient had a PCN reaction causing severe rash involving mucus membranes or skin necrosis: no Has patient had a PCN reaction that required hospitalization: no Has patient had a PCN reaction occurring within the last 10 years: no If all of the above answers are "NO", then may proceed with Cephalosporin use.     Current Outpatient Medications:  .  cetirizine (ZYRTEC) 10 MG tablet, Take 10 mg by mouth daily as needed for allergies. , Disp: , Rfl:  Social History   Socioeconomic History  . Marital status: Married    Spouse name: Not on file  . Number of children: Not on file  . Years of education: Not on file  . Highest education  level: Not on file  Social Needs  . Financial resource strain: Not on file  . Food insecurity - worry: Not on file  . Food insecurity - inability: Not on file  . Transportation needs - medical: Not on file  . Transportation needs - non-medical: Not on file  Occupational History  . Occupation: retired  Tobacco Use  . Smoking status: Former Smoker    Packs/day: 0.30    Years: 15.00    Pack years: 4.50    Types: Cigarettes    Last attempt to quit: 10/28/1984    Years since quitting: 33.1  . Smokeless tobacco: Never Used  . Tobacco comment: smoked "off and on" x 15 yrs  Substance and Sexual Activity  . Alcohol use: No    Alcohol/week: 0.0 oz    Comment: Holidays - occasional wine  . Drug use: No  . Sexual activity: Yes  Other Topics Concern  . Not on file  Social History Narrative  . Not on file   Family History  Problem Relation Age of Onset  . Aneurysm Mother        passed away at age 92  . Cancer Father        passed away after Prostate cancer metastesize  . Cancer Sister        she passed away 74 due to Breast Cancer  . Stroke Sister   . Aneurysm Sister     Health Maintenance: ***  Objective: Office vital signs reviewed. There were no vitals taken for this  visit.  Physical Examination:  General: Awake, alert, *** nourished, No acute distress HEENT: Normal    Neck: No masses palpated. No lymphadenopathy    Ears: Tympanic membranes intact, normal light reflex, no erythema, no bulging    Eyes: PERRLA, extraocular movement in tact, sclera ***    Nose: nasal turbinates moist, *** nasal discharge    Throat: moist mucus membranes, no erythema, *** tonsillar exudate.  Airway is patent Cardio: regular rate and rhythm, S1S2 heard, no murmurs appreciated Pulm: clear to auscultation bilaterally, no wheezes, rhonchi or rales; normal work of breathing on room air GI: soft, non-tender, non-distended, bowel sounds present x4, no hepatomegaly, no splenomegaly, no masses GU:  external vaginal tissue ***, cervix ***, *** punctate lesions on cervix appreciated, *** discharge from cervical os, *** bleeding, *** cervical motion tenderness, *** abdominal/ adnexal masses Extremities: warm, well perfused, No edema, cyanosis or clubbing; +*** pulses bilaterally MSK: *** gait and *** station Skin: dry; intact; no rashes or lesions Neuro: *** Strength and light touch sensation grossly intact, *** DTRs ***/4  Assessment/ Plan: 67 y.o. female   No problem-specific Assessment & Plan notes found for this encounter.   Janora Norlander, DO [place] [phone]

## 2017-12-31 ENCOUNTER — Encounter: Payer: BLUE CROSS/BLUE SHIELD | Admitting: Physical Therapy

## 2018-01-01 ENCOUNTER — Ambulatory Visit: Payer: BLUE CROSS/BLUE SHIELD | Admitting: Family

## 2018-01-01 ENCOUNTER — Ambulatory Visit: Payer: BLUE CROSS/BLUE SHIELD | Admitting: Family Medicine

## 2018-01-02 ENCOUNTER — Encounter: Payer: Self-pay | Admitting: Physical Therapy

## 2018-01-02 ENCOUNTER — Ambulatory Visit: Payer: BLUE CROSS/BLUE SHIELD | Admitting: Physical Therapy

## 2018-01-02 DIAGNOSIS — G8929 Other chronic pain: Secondary | ICD-10-CM

## 2018-01-02 DIAGNOSIS — R600 Localized edema: Secondary | ICD-10-CM

## 2018-01-02 DIAGNOSIS — M6281 Muscle weakness (generalized): Secondary | ICD-10-CM

## 2018-01-02 DIAGNOSIS — M25561 Pain in right knee: Secondary | ICD-10-CM | POA: Diagnosis not present

## 2018-01-02 NOTE — Therapy (Signed)
[facility] [address] [place], [place], [address] Phone: [phone]   Fax:  [phone]  Physical Therapy Treatment  Patient Details  Name: Jenna Smith MRN: [id number] Date of Birth: 04/20/1951 Referring Provider: Ranee Gosselin MD.   Encounter Date: 01/02/2018  PT End of Session - 01/02/18 1217    Visit Number  8    Number of Visits  20    Date for PT Re-Evaluation  02/02/18    Authorization Type  FOTO AT LEAST EVERY 5TH VISIT.    PT Start Time  1038    PT Stop Time  1131    PT Time Calculation (min)  53 min       Past Medical History:  Diagnosis Date  . Allergy   . Anemia 12/22/2017    Past Surgical History:  Procedure Laterality Date  . BREAST SURGERY Bilateral May 2001   Breast reduction  . COSMETIC SURGERY  September 2012   Face lift  . TONSILLECTOMY    . 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   Procedure: RIGHT TOTAL KNEE ARTHROPLASTY;  Surgeon: Ranee Gosselin, MD;  Location: [facility];  Service: Orthopedics;  Laterality: Right;    There were no vitals filed for this visit.  Subjective Assessment - 01/02/18 1218    Subjective  That fall set me back.  I didn't do anything for a week.    Pain Location  Knee    Pain Orientation  Right    Pain Descriptors / Indicators  Discomfort    Pain Onset  More than a month ago         [facility] PT Assessment - 01/02/18 0001      AROM   AROM Assessment Site  Knee    Right/Left Knee  Right    Right Knee Extension  -10    Right Knee Flexion  -- 104 active to 110 degrees passive.                  [facility] Adult PT Treatment/Exercise - 01/02/18 0001      Exercises   Exercises  Knee/Hip      Lumbar Exercises: Aerobic   Nustep  Level 3 x 10 minutes f/b stationary bike x 10 minutes on seat 6.        Vasopneumatic   Number Minutes Vasopneumatic   20 minutes    Vasopnuematic Location   -- Right knee.    Vasopneumatic Pressure  Medium      Manual Therapy    Manual Therapy  Passive ROM    Passive ROM  PROM into right knee flexion and extension x 5 minutes.               PT Short Term Goals - 12/15/17 1513      PT SHORT TERM GOAL #1   Title  Independent with an initial HEP.    Time  2    Period  Weeks    Status  Achieved      PT SHORT TERM GOAL #2   Title  Full active right knee extension in order to normalize gait.    Time  2    Period  Weeks    Status  On-going        PT Long Term Goals - 01/02/18 1237      PT LONG TERM GOAL #1   Title  Independent with an advanced HEP.    Time  4    Period  Weeks  Status  On-going      PT LONG TERM GOAL #2   Title  Active right knee flexion to 115 degrees+ so the patient can perform functional tasks and do so with pain not > 2-3/10.    Time  4    Period  Weeks    Status  On-going      PT LONG TERM GOAL #3   Title  Increase right hip/knee strength to a solid 5/5 to provide good stability for accomplishment of functional activities    Time  4    Period  Weeks    Status  On-going      PT LONG TERM GOAL #4   Title  Decrease edema to within 3 cms of non-affected side to assist with pain reduction and range of motion gains.    Time  4    Period  Weeks    Status  On-going      PT LONG TERM GOAL #5   Title  Perform a reciprocating stair gait with one railing with pain not > 2-3/10.    Time  4    Status  On-going            Plan - 01/02/18 1230    Clinical Impression Statement  Patient did a great job today progressing to stationary bike ([employer]) on seat 6 and was eventually able to make forward revolutions.    PT Treatment/Interventions  ADLs/Self Care Home Management;Cryotherapy;Electrical Stimulation;Gait training;Stair training;Functional mobility training;Therapeutic activities;Therapeutic exercise;Neuromuscular re-education;Patient/family education;Passive range of motion;Manual techniques;Vasopneumatic Device    PT Next Visit Plan  cont Nutstep with  progression to bike; total knee protocol.  Vasopnuematic and e'stim.  PROM.  Progress to PRE's.    Consulted and Agree with Plan of Care  Patient       Patient will benefit from skilled therapeutic intervention in order to improve the following deficits and impairments:  Abnormal gait, Decreased activity tolerance, Pain, Decreased range of motion, Increased edema, Decreased strength  Visit Diagnosis: Localized edema - Plan: PT plan of care cert/re-cert  Chronic pain of right knee - Plan: PT plan of care cert/re-cert  Muscle weakness (generalized) - Plan: PT plan of care cert/re-cert     Problem List Patient Active Problem List   Diagnosis Date Noted  . 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 Syncope 12/22/2017  . Hypokalemia 12/22/2017  . Anemia 12/22/2017  . 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 Tendonitis of knee, right 06/11/2017  . Obesity (BMI 30-39.9) 06/21/2016  . Allergic rhinitis 02/26/2016  . Right knee pain 02/26/2016    Medhansh Brinkmeier, [place] MPT 01/02/2018, 12:42 PM  [facility] [address] [place], [place], [address] Phone: [phone]   Fax:  [phone]  Name: MALANIA CARRINGTON MRN: [id number] Date of Birth: 1951/03/12

## 2018-01-05 ENCOUNTER — Ambulatory Visit: Payer: BLUE CROSS/BLUE SHIELD | Admitting: Physical Therapy

## 2018-01-05 ENCOUNTER — Encounter: Payer: Self-pay | Admitting: Physical Therapy

## 2018-01-05 DIAGNOSIS — G8929 Other chronic pain: Secondary | ICD-10-CM

## 2018-01-05 DIAGNOSIS — M6281 Muscle weakness (generalized): Secondary | ICD-10-CM

## 2018-01-05 DIAGNOSIS — R600 Localized edema: Secondary | ICD-10-CM

## 2018-01-05 DIAGNOSIS — M25561 Pain in right knee: Secondary | ICD-10-CM

## 2018-01-05 NOTE — Therapy (Signed)
[facility] [address] [place], [place], [address] Phone: [phone]   Fax:  [phone]  Physical Therapy Treatment  Patient Details  Name: Jenna Smith MRN: [id number] Date of Birth: [birth year] Referring Provider: Ranee Gosselin MD.   Encounter Date: 01/05/2018  PT End of Session - 01/05/18 1034    Visit Number  9    Number of Visits  20    Date for PT Re-Evaluation  02/02/18    Authorization Type  FOTO AT LEAST EVERY 5TH VISIT.    PT Start Time  1032    PT Stop Time  1126    PT Time Calculation (min)  54 min    Activity Tolerance  Patient tolerated treatment well    Behavior During Therapy  WFL for tasks assessed/performed       Past Medical History:  Diagnosis Date  . Allergy   . Anemia 12/22/2017    Past Surgical History:  Procedure Laterality Date  . BREAST SURGERY Bilateral May 2001   Breast reduction  . COSMETIC SURGERY  September 2012   Face lift  . TONSILLECTOMY    . 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   Procedure: RIGHT TOTAL KNEE ARTHROPLASTY;  Surgeon: Ranee Gosselin, MD;  Location: [facility];  Service: Orthopedics;  Laterality: Right;    There were no vitals filed for this visit.  Subjective Assessment - 01/05/18 1033    Subjective  Reports that she saw Dr. Darrelyn Hillock this morning and h     Limitations  Walking    How long can you walk comfortably?  Short distances around house with straight cane.    Patient Stated Goals  Walk with pain or using a cane.    Currently in Pain?  Other (Comment) No pain assessment provided by patient         [facility] PT Assessment - 01/05/18 0001      Assessment   Medical Diagnosis  Right total knee replacement.    Onset Date/Surgical Date  11/19/17    Next MD Visit  01/2018      ROM / Strength   AROM / PROM / Strength  AROM      AROM   Overall AROM   Deficits    AROM Assessment Site  Knee    Right/Left Knee  Right    Right Knee Extension  -10    Right  Knee Flexion  109                  [facility] Adult PT Treatment/Exercise - 01/05/18 0001      Lumbar Exercises: Aerobic   Stationary Bike  --    Nustep  --      Knee/Hip Exercises: Aerobic   Stationary Bike  Seat 6 x5 min    Nustep  L5 x14 min      Knee/Hip Exercises: Standing   Forward Lunges  Right;20 reps;5 seconds    Rocker Board  2 minutes      Modalities   Modalities  Electrical Stimulation;Vasopneumatic      Vasopneumatic   Number Minutes Vasopneumatic   15 minutes    Vasopnuematic Location   Knee    Vasopneumatic Pressure  Medium    Vasopneumatic Temperature   34      Manual Therapy   Manual Therapy  Passive ROM    Passive ROM  PROM of R knee extension and flexion with holds at end range as well as passive R HS stretch 5x10 sec  PT Short Term Goals - 12/15/17 1513      PT SHORT TERM GOAL #1   Title  Independent with an initial HEP.    Time  2    Period  Weeks    Status  Achieved      PT SHORT TERM GOAL #2   Title  Full active right knee extension in order to normalize gait.    Time  2    Period  Weeks    Status  On-going        PT Long Term Goals - 01/02/18 1237      PT LONG TERM GOAL #1   Title  Independent with an advanced HEP.    Time  4    Period  Weeks    Status  On-going      PT LONG TERM GOAL #2   Title  Active right knee flexion to 115 degrees+ so the patient can perform functional tasks and do so with pain not > 2-3/10.    Time  4    Period  Weeks    Status  On-going      PT LONG TERM GOAL #3   Title  Increase right hip/knee strength to a solid 5/5 to provide good stability for accomplishment of functional activities    Time  4    Period  Weeks    Status  On-going      PT LONG TERM GOAL #4   Title  Decrease edema to within 3 cms of non-affected side to assist with pain reduction and range of motion gains.    Time  4    Period  Weeks    Status  On-going      PT LONG TERM GOAL #5   Title  Perform a  reciprocating stair gait with one railing with pain not > 2-3/10.    Time  4    Status  On-going            Plan - 01/05/18 1208    Clinical Impression Statement  Patient tolerated today's treatment well with no complaints of any increased pain. Patient eager to advance to stationary bike for improvement in ROM. Patient guided through stretching and ROM exercises to improve AROM. AROM R knee flexion improved to 109 deg. Extension still lacking 10 deg to full neutral. Normal vasopneumatic response noted following removal of the modalities.    Rehab Potential  Excellent    PT Frequency  3x / week    PT Duration  4 weeks    PT Treatment/Interventions  ADLs/Self Care Home Management;Cryotherapy;Electrical Stimulation;Gait training;Stair training;Functional mobility training;Therapeutic activities;Therapeutic exercise;Neuromuscular re-education;Patient/family education;Passive range of motion;Manual techniques;Vasopneumatic Device    PT Next Visit Plan  cont Nutstep with progression to bike; total knee protocol.  Vasopnuematic and e'stim.  PROM.  Progress to PRE's.    Consulted and Agree with Plan of Care  Patient       Patient will benefit from skilled therapeutic intervention in order to improve the following deficits and impairments:  Abnormal gait, Decreased activity tolerance, Pain, Decreased range of motion, Increased edema, Decreased strength  Visit Diagnosis: Localized edema  Chronic pain of right knee  Muscle weakness (generalized)     Problem List Patient Active Problem List   Diagnosis Date Noted  . 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 Syncope 12/22/2017  . Hypokalemia 12/22/2017  . Anemia 12/22/2017  . 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 Tendonitis of knee, right  06/11/2017  . Obesity (BMI 30-39.9) 06/21/2016  . Allergic rhinitis 02/26/2016  . Right knee pain 02/26/2016    Marvell Fuller, PTA 01/05/2018, 12:12 PM  [facility] [address] [place], [place], [address] Phone: [phone]   Fax:  [phone]  Name: BAANI ANGLES MRN: [id number] Date of Birth: 1950-11-22

## 2018-01-07 ENCOUNTER — Encounter: Payer: BLUE CROSS/BLUE SHIELD | Admitting: Physical Therapy

## 2018-01-07 DIAGNOSIS — S069XAA Unspecified intracranial injury with loss of consciousness status unknown, initial encounter: Secondary | ICD-10-CM | POA: Insufficient documentation

## 2018-01-09 ENCOUNTER — Ambulatory Visit: Payer: BLUE CROSS/BLUE SHIELD | Admitting: Physical Therapy

## 2018-01-09 DIAGNOSIS — M25561 Pain in right knee: Secondary | ICD-10-CM | POA: Diagnosis not present

## 2018-01-09 DIAGNOSIS — G8929 Other chronic pain: Secondary | ICD-10-CM

## 2018-01-09 DIAGNOSIS — M6281 Muscle weakness (generalized): Secondary | ICD-10-CM

## 2018-01-09 DIAGNOSIS — R600 Localized edema: Secondary | ICD-10-CM

## 2018-01-09 NOTE — Therapy (Addendum)
[facility] [place], [place], [address] Phone: [phone]   Fax:  [phone]  Physical Therapy Treatment  Patient Details  Name: Jenna Smith MRN: [id number] Date of Birth: August 19, 1951 Referring Provider: [name] MD.   Encounter Date: 01/09/2018  PT End of Session - 01/09/18 1114    Visit Number  10    Number of Visits  20    Date for PT Re-Evaluation  02/02/18    Authorization Type  FOTO AT LEAST EVERY 5TH VISIT.    PT Start Time  1030    PT Stop Time  1125    PT Time Calculation (min)  55 min    Activity Tolerance  Patient tolerated treatment well    Behavior During Therapy  WFL for tasks assessed/performed       Past Medical History:  Diagnosis Date  . Allergy   . Anemia 12/22/2017    Past Surgical History:  Procedure Laterality Date  . BREAST SURGERY Bilateral May 2001   Breast reduction  . COSMETIC SURGERY  September 2012   Face lift  . TONSILLECTOMY    . 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   Procedure: RIGHT TOTAL KNEE ARTHROPLASTY;  Surgeon: [name], MD;  Location: [facility];  Service: Orthopedics;  Laterality: Right;    There were no vitals filed for this visit.  Subjective Assessment - 01/09/18 1033    Subjective  Patient reports right knee feels okay. She states she doesn't feel pain anymore she just feels tight.    Limitations  Walking    How long can you walk comfortably?  Short distances around house with straight cane.    Patient Stated Goals  Walk with pain or using a cane.    Currently in Pain?  No/denies    Pain Type  --    Pain Onset  --         [facility] PT Assessment - 01/09/18 0001      Assessment   Medical Diagnosis  Right total knee replacement.      Observation/Other Assessments   Focus on Therapeutic Outcomes (FOTO)   40% limitation                  [facility] Adult PT Treatment/Exercise - 01/09/18 0001      Exercises   Exercises  Knee/Hip       Knee/Hip Exercises: Stretches   Knee: Self-Stretch to increase Flexion  Both;30 seconds;4 reps      Other Knee/Hip Stretches  knee flexion stretch 3 minutes on knee flexion machine 30#      Knee/Hip Exercises: Aerobic   Stationary Bike  Seat 6 x6 min    Nustep  L5 x10 min      Knee/Hip Exercises: Machines for Strengthening   Cybex Knee Extension  30#  until fatigue    Cybex Knee Flexion  30# until fatigue      Knee/Hip Exercises: Standing   Knee Flexion  --      Modalities   Modalities  Vasopneumatic      Vasopneumatic   Number Minutes Vasopneumatic   15 minutes    Vasopnuematic Location   Knee    Vasopneumatic Pressure  Medium    Vasopneumatic Temperature   34               PT Short Term Goals - 12/15/17 1513      PT SHORT TERM GOAL #1   Title  Independent with an initial  Hx of total knee arthroplasty, 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 Tendonitis of knee, right 06/11/2017  . Obesity (BMI 30-39.9)  06/21/2016  . Allergic rhinitis 02/26/2016  . Right knee pain 02/26/2016   Gabriela Eves, PT, DPT 01/09/2018, 1:03 PM  [facility] [place], [place], [address] Phone: [phone]   Fax:  [phone]  Name: AYSA LARIVEE MRN: [id number] Date of Birth: 08-12-1951  [facility] [place], [place], [address] Phone: [phone]   Fax:  [phone]  Physical Therapy Treatment  Patient Details  Name: Jenna Smith MRN: [id number] Date of Birth: August 19, 1951 Referring Provider: [name] MD.   Encounter Date: 01/09/2018  PT End of Session - 01/09/18 1114    Visit Number  10    Number of Visits  20    Date for PT Re-Evaluation  02/02/18    Authorization Type  FOTO AT LEAST EVERY 5TH VISIT.    PT Start Time  1030    PT Stop Time  1125    PT Time Calculation (min)  55 min    Activity Tolerance  Patient tolerated treatment well    Behavior During Therapy  WFL for tasks assessed/performed       Past Medical History:  Diagnosis Date  . Allergy   . Anemia 12/22/2017    Past Surgical History:  Procedure Laterality Date  . BREAST SURGERY Bilateral May 2001   Breast reduction  . COSMETIC SURGERY  September 2012   Face lift  . TONSILLECTOMY    . 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   Procedure: RIGHT TOTAL KNEE ARTHROPLASTY;  Surgeon: [name], MD;  Location: [facility];  Service: Orthopedics;  Laterality: Right;    There were no vitals filed for this visit.  Subjective Assessment - 01/09/18 1033    Subjective  Patient reports right knee feels okay. She states she doesn't feel pain anymore she just feels tight.    Limitations  Walking    How long can you walk comfortably?  Short distances around house with straight cane.    Patient Stated Goals  Walk with pain or using a cane.    Currently in Pain?  No/denies    Pain Type  --    Pain Onset  --         [facility] PT Assessment - 01/09/18 0001      Assessment   Medical Diagnosis  Right total knee replacement.      Observation/Other Assessments   Focus on Therapeutic Outcomes (FOTO)   40% limitation                  [facility] Adult PT Treatment/Exercise - 01/09/18 0001      Exercises   Exercises  Knee/Hip       Knee/Hip Exercises: Stretches   Knee: Self-Stretch to increase Flexion  Both;30 seconds;4 reps      Other Knee/Hip Stretches  knee flexion stretch 3 minutes on knee flexion machine 30#      Knee/Hip Exercises: Aerobic   Stationary Bike  Seat 6 x6 min    Nustep  L5 x10 min      Knee/Hip Exercises: Machines for Strengthening   Cybex Knee Extension  30#  until fatigue    Cybex Knee Flexion  30# until fatigue      Knee/Hip Exercises: Standing   Knee Flexion  --      Modalities   Modalities  Vasopneumatic      Vasopneumatic   Number Minutes Vasopneumatic   15 minutes    Vasopnuematic Location   Knee    Vasopneumatic Pressure  Medium    Vasopneumatic Temperature   34               PT Short Term Goals - 12/15/17 1513      PT SHORT TERM GOAL #1   Title  Independent with an initial

## 2018-01-12 ENCOUNTER — Ambulatory Visit: Payer: BLUE CROSS/BLUE SHIELD | Admitting: Physical Therapy

## 2018-01-12 ENCOUNTER — Encounter: Payer: Self-pay | Admitting: Physical Therapy

## 2018-01-12 DIAGNOSIS — M25561 Pain in right knee: Secondary | ICD-10-CM

## 2018-01-12 DIAGNOSIS — G8929 Other chronic pain: Secondary | ICD-10-CM

## 2018-01-12 DIAGNOSIS — R600 Localized edema: Secondary | ICD-10-CM

## 2018-01-12 DIAGNOSIS — M6281 Muscle weakness (generalized): Secondary | ICD-10-CM

## 2018-01-12 NOTE — Therapy (Signed)
[facility] [place], [place], [address] Phone: [phone]   Fax:  [phone]  Physical Therapy Treatment  Patient Details  Name: Jenna Smith MRN: [id number] Date of Birth: 04-Feb-1951 Referring Provider: [name] MD.   Encounter Date: 01/12/2018  PT End of Session - 01/12/18 1038    Visit Number  11    Number of Visits  20    Date for PT Re-Evaluation  02/02/18    Authorization Type  FOTO AT LEAST EVERY 5TH VISIT.    PT Start Time  1030    PT Stop Time  1128    PT Time Calculation (min)  58 min    Activity Tolerance  Patient tolerated treatment well    Behavior During Therapy  WFL for tasks assessed/performed       Past Medical History:  Diagnosis Date  . Allergy   . Anemia 12/22/2017    Past Surgical History:  Procedure Laterality Date  . BREAST SURGERY Bilateral May 2001   Breast reduction  . COSMETIC SURGERY  September 2012   Face lift  . TONSILLECTOMY    . 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   Procedure: RIGHT TOTAL KNEE ARTHROPLASTY;  Surgeon: [name], MD;  Location: [facility];  Service: Orthopedics;  Laterality: Right;    There were no vitals filed for this visit.  Subjective Assessment - 01/12/18 1037    Subjective  Reports that her knee is uncomfortable but not pain. Reports that she is now able to bend her knee much better to don pants better instead of bending over with her back.    Limitations  Walking    How long can you walk comfortably?  Short distances around house with straight cane.    Patient Stated Goals  Walk with pain or using a cane.    Currently in Pain?  No/denies         [facility] PT Assessment - 01/12/18 0001      Assessment   Medical Diagnosis  Right total knee replacement.    Onset Date/Surgical Date  11/19/17    Next MD Visit  01/2018      ROM / Strength   AROM / PROM / Strength  AROM      AROM   Overall AROM   Deficits;Within functional limits  for tasks performed    AROM Assessment Site  Knee    Right/Left Knee  Right    Right Knee Extension  -4    Right Knee Flexion  117                  [facility] Adult PT Treatment/Exercise - 01/12/18 0001      Knee/Hip Exercises: Aerobic   Stationary Bike  L2 x15 min      Knee/Hip Exercises: Machines for Strengthening   Cybex Knee Extension  30# 3x10 reps    Cybex Knee Flexion  3x10 reps    Cybex Leg Press  2 pl, seat 6 x20 reps      Knee/Hip Exercises: Standing   Forward Lunges  Right;20 reps;5 seconds      Knee/Hip Exercises: Supine   Straight Leg Raises  AROM;Right;15 reps      Modalities   Modalities  Vasopneumatic      Vasopneumatic   Number Minutes Vasopneumatic   15 minutes    Vasopnuematic Location   Knee    Vasopneumatic Pressure  Medium    Vasopneumatic Temperature   34  [facility] [place], [place], [address] Phone: [phone]   Fax:  [phone]  Physical Therapy Treatment  Patient Details  Name: Jenna Smith MRN: [id number] Date of Birth: 04-Feb-1951 Referring Provider: [name] MD.   Encounter Date: 01/12/2018  PT End of Session - 01/12/18 1038    Visit Number  11    Number of Visits  20    Date for PT Re-Evaluation  02/02/18    Authorization Type  FOTO AT LEAST EVERY 5TH VISIT.    PT Start Time  1030    PT Stop Time  1128    PT Time Calculation (min)  58 min    Activity Tolerance  Patient tolerated treatment well    Behavior During Therapy  WFL for tasks assessed/performed       Past Medical History:  Diagnosis Date  . Allergy   . Anemia 12/22/2017    Past Surgical History:  Procedure Laterality Date  . BREAST SURGERY Bilateral May 2001   Breast reduction  . COSMETIC SURGERY  September 2012   Face lift  . TONSILLECTOMY    . 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   Procedure: RIGHT TOTAL KNEE ARTHROPLASTY;  Surgeon: [name], MD;  Location: [facility];  Service: Orthopedics;  Laterality: Right;    There were no vitals filed for this visit.  Subjective Assessment - 01/12/18 1037    Subjective  Reports that her knee is uncomfortable but not pain. Reports that she is now able to bend her knee much better to don pants better instead of bending over with her back.    Limitations  Walking    How long can you walk comfortably?  Short distances around house with straight cane.    Patient Stated Goals  Walk with pain or using a cane.    Currently in Pain?  No/denies         [facility] PT Assessment - 01/12/18 0001      Assessment   Medical Diagnosis  Right total knee replacement.    Onset Date/Surgical Date  11/19/17    Next MD Visit  01/2018      ROM / Strength   AROM / PROM / Strength  AROM      AROM   Overall AROM   Deficits;Within functional limits  for tasks performed    AROM Assessment Site  Knee    Right/Left Knee  Right    Right Knee Extension  -4    Right Knee Flexion  117                  [facility] Adult PT Treatment/Exercise - 01/12/18 0001      Knee/Hip Exercises: Aerobic   Stationary Bike  L2 x15 min      Knee/Hip Exercises: Machines for Strengthening   Cybex Knee Extension  30# 3x10 reps    Cybex Knee Flexion  3x10 reps    Cybex Leg Press  2 pl, seat 6 x20 reps      Knee/Hip Exercises: Standing   Forward Lunges  Right;20 reps;5 seconds      Knee/Hip Exercises: Supine   Straight Leg Raises  AROM;Right;15 reps      Modalities   Modalities  Vasopneumatic      Vasopneumatic   Number Minutes Vasopneumatic   15 minutes    Vasopnuematic Location   Knee    Vasopneumatic Pressure  Medium    Vasopneumatic Temperature   34  brain ([place]) 12/24/2017  . Syncope 12/22/2017  . Hypokalemia 12/22/2017  . Anemia 12/22/2017  . 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 Tendonitis of knee, right 06/11/2017  . Obesity (BMI 30-39.9) 06/21/2016  . Allergic rhinitis  02/26/2016  . Right knee pain 02/26/2016    Standley Brooking, PTA 01/12/2018, 11:54 AM  [facility] [address] [place], [place], [address] Phone: [phone]   Fax:  [phone]  Name: PIEDAD STANDIFORD MRN: [id number] Date of Birth: May 29, 1951

## 2018-01-14 ENCOUNTER — Encounter: Payer: BLUE CROSS/BLUE SHIELD | Admitting: Physical Therapy

## 2018-01-16 ENCOUNTER — Encounter: Payer: BLUE CROSS/BLUE SHIELD | Admitting: Physical Therapy

## 2018-01-19 ENCOUNTER — Encounter: Payer: BLUE CROSS/BLUE SHIELD | Admitting: Physical Therapy

## 2018-01-21 ENCOUNTER — Encounter: Payer: Self-pay | Admitting: Physical Therapy

## 2018-01-21 ENCOUNTER — Ambulatory Visit: Payer: BLUE CROSS/BLUE SHIELD | Admitting: Physical Therapy

## 2018-01-21 DIAGNOSIS — R600 Localized edema: Secondary | ICD-10-CM

## 2018-01-21 DIAGNOSIS — M25561 Pain in right knee: Secondary | ICD-10-CM

## 2018-01-21 DIAGNOSIS — G8929 Other chronic pain: Secondary | ICD-10-CM

## 2018-01-21 DIAGNOSIS — M6281 Muscle weakness (generalized): Secondary | ICD-10-CM

## 2018-01-21 NOTE — Therapy (Signed)
Right knee pain 02/26/2016    Phillips Climes, PTA 01/21/2018, 11:31 AM  [facility] [place], [place], [address] Phone: [phone]   Fax:   [phone]  Name: Jenna Smith MRN: [id number] Date of Birth: 1951-09-12  [facility] [place], [place], [address] Phone: [phone]   Fax:  [phone]  Physical Therapy Treatment  Patient Details  Name: Jenna Smith MRN: [id number] Date of Birth: [birth year] Referring Provider: [name] MD.   Encounter Date: 01/21/2018  PT End of Session - 01/21/18 1128    Visit Number  12    Number of Visits  20    Date for PT Re-Evaluation  02/02/18    Authorization Type  FOTO AT LEAST EVERY 5TH VISIT.    PT Start Time  1032    PT Stop Time  1129    PT Time Calculation (min)  57 min    Activity Tolerance  Patient tolerated treatment well    Behavior During Therapy  WFL for tasks assessed/performed       Past Medical History:  Diagnosis Date  . Allergy   . Anemia 12/22/2017    Past Surgical History:  Procedure Laterality Date  . BREAST SURGERY Bilateral May 2001   Breast reduction  . COSMETIC SURGERY  September 2012   Face lift  . TONSILLECTOMY    . 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   Procedure: RIGHT TOTAL KNEE ARTHROPLASTY;  Surgeon: [name], MD;  Location: [facility];  Service: Orthopedics;  Laterality: Right;    There were no vitals filed for this visit.  Subjective Assessment - 01/21/18 1037    Subjective  Patient arrived with some ongoing tightness due to missing a week of therapy    Limitations  Walking    How long can you walk comfortably?  Short distances around house with straight cane.    Patient Stated Goals  Walk with pain or using a cane.    Currently in Pain?  No/denies         [facility] PT Assessment - 01/21/18 0001      AROM   AROM Assessment Site  Knee    Right/Left Knee  Right    Right Knee Extension  -9    Right Knee Flexion  105      PROM   PROM Assessment Site  Knee    Right/Left Knee  Right    Right Knee Extension  -6    Right Knee Flexion  120            No data recorded       [facility] Adult PT Treatment/Exercise - 01/21/18  0001      Bed Mobility   Bed Mobility  Right Sidelying to Sit      Knee/Hip Exercises: Aerobic   Stationary Bike  41min L1-3      Knee/Hip Exercises: Machines for Strengthening   Cybex Knee Extension  30# 3x10 reps    Cybex Knee Flexion  40# 4x10 reps    Cybex Leg Press  2 pl, seat 6 x 20 reps      Vasopneumatic   Number Minutes Vasopneumatic   15 minutes    Vasopnuematic Location   Knee    Vasopneumatic Pressure  Medium      Manual Therapy   Manual Therapy  Passive ROM    Passive ROM  PROM of R knee flexion/and more focus on extension with holds at end range                PT Short Term Goals - 12/15/17 1513      PT SHORT TERM GOAL #1   Title  Independent with an initial HEP.  [facility] [place], [place], [address] Phone: [phone]   Fax:  [phone]  Physical Therapy Treatment  Patient Details  Name: Jenna Smith MRN: [id number] Date of Birth: [birth year] Referring Provider: [name] MD.   Encounter Date: 01/21/2018  PT End of Session - 01/21/18 1128    Visit Number  12    Number of Visits  20    Date for PT Re-Evaluation  02/02/18    Authorization Type  FOTO AT LEAST EVERY 5TH VISIT.    PT Start Time  1032    PT Stop Time  1129    PT Time Calculation (min)  57 min    Activity Tolerance  Patient tolerated treatment well    Behavior During Therapy  WFL for tasks assessed/performed       Past Medical History:  Diagnosis Date  . Allergy   . Anemia 12/22/2017    Past Surgical History:  Procedure Laterality Date  . BREAST SURGERY Bilateral May 2001   Breast reduction  . COSMETIC SURGERY  September 2012   Face lift  . TONSILLECTOMY    . 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   Procedure: RIGHT TOTAL KNEE ARTHROPLASTY;  Surgeon: [name], MD;  Location: [facility];  Service: Orthopedics;  Laterality: Right;    There were no vitals filed for this visit.  Subjective Assessment - 01/21/18 1037    Subjective  Patient arrived with some ongoing tightness due to missing a week of therapy    Limitations  Walking    How long can you walk comfortably?  Short distances around house with straight cane.    Patient Stated Goals  Walk with pain or using a cane.    Currently in Pain?  No/denies         [facility] PT Assessment - 01/21/18 0001      AROM   AROM Assessment Site  Knee    Right/Left Knee  Right    Right Knee Extension  -9    Right Knee Flexion  105      PROM   PROM Assessment Site  Knee    Right/Left Knee  Right    Right Knee Extension  -6    Right Knee Flexion  120            No data recorded       [facility] Adult PT Treatment/Exercise - 01/21/18  0001      Bed Mobility   Bed Mobility  Right Sidelying to Sit      Knee/Hip Exercises: Aerobic   Stationary Bike  41min L1-3      Knee/Hip Exercises: Machines for Strengthening   Cybex Knee Extension  30# 3x10 reps    Cybex Knee Flexion  40# 4x10 reps    Cybex Leg Press  2 pl, seat 6 x 20 reps      Vasopneumatic   Number Minutes Vasopneumatic   15 minutes    Vasopnuematic Location   Knee    Vasopneumatic Pressure  Medium      Manual Therapy   Manual Therapy  Passive ROM    Passive ROM  PROM of R knee flexion/and more focus on extension with holds at end range                PT Short Term Goals - 12/15/17 1513      PT SHORT TERM GOAL #1   Title  Independent with an initial HEP.

## 2018-01-23 ENCOUNTER — Encounter: Payer: Self-pay | Admitting: Physical Therapy

## 2018-01-23 ENCOUNTER — Ambulatory Visit: Payer: BLUE CROSS/BLUE SHIELD | Admitting: Physical Therapy

## 2018-01-23 DIAGNOSIS — M6281 Muscle weakness (generalized): Secondary | ICD-10-CM

## 2018-01-23 DIAGNOSIS — R600 Localized edema: Secondary | ICD-10-CM

## 2018-01-23 DIAGNOSIS — M25561 Pain in right knee: Secondary | ICD-10-CM

## 2018-01-23 DIAGNOSIS — G8929 Other chronic pain: Secondary | ICD-10-CM

## 2018-01-23 NOTE — Therapy (Signed)
[facility] [place], [place], [address] Phone: [phone]   Fax:  [phone]  Physical Therapy Treatment  Patient Details  Name: Jenna Smith MRN: [id number] Date of Birth: [birth year] Referring Provider: [name] MD.   Encounter Date: 01/23/2018  PT End of Session - 01/23/18 1224    Visit Number  13    Number of Visits  20    Date for PT Re-Evaluation  02/02/18    Authorization Type  FOTO AT LEAST EVERY 5TH VISIT.    PT Start Time  1036 Late arrival to treatment.    PT Stop Time  1125    PT Time Calculation (min)  49 min    Activity Tolerance  Patient tolerated treatment well    Behavior During Therapy  WFL for tasks assessed/performed       Past Medical History:  Diagnosis Date  . Allergy   . Anemia 12/22/2017    Past Surgical History:  Procedure Laterality Date  . BREAST SURGERY Bilateral May 2001   Breast reduction  . COSMETIC SURGERY  September 2012   Face lift  . TONSILLECTOMY    . 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   Procedure: RIGHT TOTAL KNEE ARTHROPLASTY;  Surgeon: [name], MD;  Location: [facility];  Service: Orthopedics;  Laterality: Right;    There were no vitals filed for this visit.  Subjective Assessment - 01/23/18 1058    Subjective  Doing better.      Pain Score  1                 No data recorded       [facility] Adult PT Treatment/Exercise - 01/23/18 0001      Exercises   Exercises  Knee/Hip      Lumbar Exercises: Aerobic   Stationary Bike  Level 2 x 15 minutes.      Lumbar Exercises: Machines for Strengthening   Cybex Lumbar Extension  --    Cybex Knee Extension  --      Knee/Hip Exercises: Aerobic   Stationary Bike  Level 2 x 15 minutes.      Knee/Hip Exercises: Machines for Strengthening   Cybex Knee Extension  10# x 5 minutes.    Cybex Knee Flexion  40# x 5 minutes.      Modalities   Modalities  Iontophoresis;Vasopneumatic      Vasopneumatic   Number Minutes Vasopneumatic   15 minutes    Vasopnuematic Location   -- Right knee    Vasopneumatic Pressure  Medium               PT Short Term Goals - 12/15/17 1513      PT SHORT TERM GOAL #1   Title  Independent with an initial HEP.    Time  2    Period  Weeks    Status  Achieved      PT SHORT TERM GOAL #2   Title  Full active right knee extension in order to normalize gait.    Time  2    Period  Weeks    Status  On-going        PT Long Term Goals - 01/02/18 1237      PT LONG TERM GOAL #1   Title  Independent with an advanced HEP.    Time  4    Period  Weeks    Status  On-going      PT LONG TERM GOAL #2  Title  Active right knee flexion to 115 degrees+ so the patient can perform functional tasks and do so with pain not > 2-3/10.    Time  4    Period  Weeks    Status  On-going      PT LONG TERM GOAL #3   Title  Increase right hip/knee strength to a solid 5/5 to provide good stability for accomplishment of functional activities    Time  4    Period  Weeks    Status  On-going      PT LONG TERM GOAL #4   Title  Decrease edema to within 3 cms of non-affected side to assist with pain reduction and range of motion gains.    Time  4    Period  Weeks    Status  On-going      PT LONG TERM GOAL #5   Title  Perform a reciprocating stair gait with one railing with pain not > 2-3/10.    Time  4    Status  On-going            Plan - 01/23/18 1228    Clinical Impression Statement  Patient progressing very well toward goals.    PT Treatment/Interventions  ADLs/Self Care Home Management;Cryotherapy;Electrical Stimulation;Gait training;Stair training;Functional mobility training;Therapeutic activities;Therapeutic exercise;Neuromuscular re-education;Patient/family education;Passive range of motion;Manual techniques;Vasopneumatic Device    PT Next Visit Plan  cont with POC for ext ROM focus and strengthening (MD Gioffre next week 01/26/18, please  assess goals/FOTO for note next visit)    Consulted and Agree with Plan of Care  Patient       Patient will benefit from skilled therapeutic intervention in order to improve the following deficits and impairments:  Abnormal gait, Decreased activity tolerance, Pain, Decreased range of motion, Increased edema, Decreased strength  Visit Diagnosis: Localized edema  Chronic pain of right knee  Muscle weakness (generalized)     Problem List Patient Active Problem List   Diagnosis Date Noted  . 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 Syncope 12/22/2017  . Hypokalemia 12/22/2017  . Anemia 12/22/2017  . 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 Tendonitis of knee, right 06/11/2017  . Obesity (BMI 30-39.9) 06/21/2016  . Allergic rhinitis 02/26/2016  . Right knee pain 02/26/2016    Jenna Smith, [place] MPT 01/23/2018, 12:37 PM  [facility] [address] [place], [place], [address] Phone: [phone]   Fax:  [phone]  Name: KERSTON LANDECK MRN: [id number] Date of Birth: [birth year]

## 2018-01-26 ENCOUNTER — Encounter: Payer: Self-pay | Admitting: Physical Therapy

## 2018-01-26 ENCOUNTER — Ambulatory Visit: Payer: BLUE CROSS/BLUE SHIELD | Attending: Orthopedic Surgery | Admitting: Physical Therapy

## 2018-01-26 DIAGNOSIS — G8929 Other chronic pain: Secondary | ICD-10-CM | POA: Diagnosis present

## 2018-01-26 DIAGNOSIS — R600 Localized edema: Secondary | ICD-10-CM

## 2018-01-26 DIAGNOSIS — M25561 Pain in right knee: Secondary | ICD-10-CM | POA: Insufficient documentation

## 2018-01-26 DIAGNOSIS — M6281 Muscle weakness (generalized): Secondary | ICD-10-CM | POA: Diagnosis present

## 2018-01-26 NOTE — Therapy (Signed)
[facility] [address] [place], [place], [address] Phone: [phone]   Fax:  [phone]  Physical Therapy Treatment  Patient Details  Name: Jenna Smith MRN: [id number] Date of Birth: [birth year] Referring Provider: Ranee Gosselin MD.   Encounter Date: 01/26/2018  PT End of Session - 01/26/18 1213    Visit Number  14    Number of Visits  20    Date for PT Re-Evaluation  02/02/18    Authorization Type  FOTO AT LEAST EVERY 5TH VISIT.    PT Start Time  1115    PT Stop Time  1206    PT Time Calculation (min)  51 min    Activity Tolerance  Patient tolerated treatment well    Behavior During Therapy  WFL for tasks assessed/performed       Past Medical History:  Diagnosis Date  . Allergy   . Anemia 12/22/2017    Past Surgical History:  Procedure Laterality Date  . BREAST SURGERY Bilateral May 2001   Breast reduction  . COSMETIC SURGERY  September 2012   Face lift  . TONSILLECTOMY    . 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   Procedure: RIGHT TOTAL KNEE ARTHROPLASTY;  Surgeon: Ranee Gosselin, MD;  Location: [facility];  Service: Orthopedics;  Laterality: Right;    There were no vitals filed for this visit.  Subjective Assessment - 01/26/18 1150    Subjective  Doctor discharged me.  Today is my last day.  My son has a lot of workout equipment.    Patient Stated Goals  Walk with pain or using a cane.    Pain Score  1     Pain Location  Knee    Pain Orientation  Right    Pain Descriptors / Indicators  Discomfort    Pain Onset  More than a month ago         [facility] PT Assessment - 01/26/18 0001      AROM   Right Knee Extension  -9    Right Knee Flexion  120                   [facility] Adult PT Treatment/Exercise - 01/26/18 0001      Exercises   Exercises  Knee/Hip      Lumbar Exercises: Aerobic   Stationary Bike  Level 2 x 15 minutes.      Knee/Hip Exercises: Machines for Strengthening   Cybex Knee  Extension  20# x 2 minutes.    Cybex Knee Flexion  40# x 3 minutes.    Cybex Leg Press  2 plates x 3 minutes.      Modalities   Modalities  Vasopneumatic      Vasopneumatic   Number Minutes Vasopneumatic   20 minutes    Vasopnuematic Location   -- Right knee.    Vasopneumatic Pressure  Medium               PT Short Term Goals - 12/15/17 1513      PT SHORT TERM GOAL #1   Title  Independent with an initial HEP.    Time  2    Period  Weeks    Status  Achieved      PT SHORT TERM GOAL #2   Title  Full active right knee extension in order to normalize gait.    Time  2    Period  Weeks    Status  On-going  PT Long Term Goals - 01/26/18 1148      PT LONG TERM GOAL #1   Title  Independent with an advanced HEP.    Time  4    Period  Weeks    Status  Achieved      PT LONG TERM GOAL #2   Title  Active right knee flexion to 115 degrees+ so the patient can perform functional tasks and do so with pain not > 2-3/10.    Time  4    Period  Weeks    Status  Achieved      PT LONG TERM GOAL #3   Title  Increase right hip/knee strength to a solid 5/5 to provide good stability for accomplishment of functional activities    Time  4    Period  Weeks    Status  Achieved      PT LONG TERM GOAL #4   Title  Decrease edema to within 3 cms of non-affected side to assist with pain reduction and range of motion gains.    Time  4    Period  Weeks    Status  Achieved      PT LONG TERM GOAL #5   Title  Perform a reciprocating stair gait with one railing with pain not > 2-3/10.    Time  4    Period  Weeks    Status  Achieved            Plan - 01/26/18 1214    Clinical Impression Statement  Patient did extremely well       Patient will benefit from skilled therapeutic intervention in order to improve the following deficits and impairments:     Visit Diagnosis: Localized edema  Chronic pain of right knee  Muscle weakness (generalized)     Problem  List Patient Active Problem List   Diagnosis Date Noted  . 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 Syncope 12/22/2017  . Hypokalemia 12/22/2017  . Anemia 12/22/2017  . 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 Tendonitis of knee, right 06/11/2017  . Obesity (BMI 30-39.9) 06/21/2016  . Allergic rhinitis 02/26/2016  . Right knee pain 02/26/2016     PHYSICAL THERAPY DISCHARGE SUMMARY  Visits from Start of Care: 14.  Current functional level related to goals / functional outcomes: See above.   Remaining deficits: -9 degrees from full right knee extension.   Education / Equipment: HEP. Plan: Patient agrees to discharge.  Patient goals were partially met. Patient is being discharged due to the physician's request.  ?????      Tayari Yankee, [place] MPT 01/26/2018, 12:19 PM  [facility] [address] [place], [place], [address] Phone: [phone]   Fax:  [phone]  Name: Jenna Smith MRN: [id number] Date of Birth: [birth year]

## 2018-01-28 ENCOUNTER — Encounter: Payer: BLUE CROSS/BLUE SHIELD | Admitting: Physical Therapy

## 2018-01-30 ENCOUNTER — Encounter: Payer: BLUE CROSS/BLUE SHIELD | Admitting: Physical Therapy

## 2018-03-04 ENCOUNTER — Encounter: Payer: Self-pay | Admitting: Family Medicine

## 2018-03-04 ENCOUNTER — Ambulatory Visit: Payer: BLUE CROSS/BLUE SHIELD | Admitting: Family Medicine

## 2018-03-04 VITALS — BP 121/85 | HR 81 | Temp 98.1°F | Ht 66.0 in | Wt 173.0 lb

## 2018-03-04 DIAGNOSIS — L089 Local infection of the skin and subcutaneous tissue, unspecified: Secondary | ICD-10-CM | POA: Diagnosis not present

## 2018-03-04 MED ORDER — MUPIROCIN 2 % EX OINT: 1.0000 "application " | TOPICAL_OINTMENT | Freq: Three times a day (TID) | CUTANEOUS | 0 refills | Status: DC

## 2018-03-04 NOTE — Progress Notes (Signed)
Subjective: CC: sore on lower back PCP: Janora Norlander, DO Jenna Smith is a 67 y.o. female presenting to clinic today for:  1. Sore on lower back Patient reports a one-week history of a sore on the left inner buttock.  She notes that she been applying and a tube of old Bactroban cream to the affected area and it started getting better but she ran out.  She denies any purulence, fevers, chills, preceding injury.  She notes that she had been wearing tight spandex and sweating a lot prior to the wound appearing.   ROS: Per HPI  Allergies  Allergen Reactions  . Prednisone   . Indomethacin Swelling    Face swells up  . Latex Hives  . Neosporin [Neomycin-Bacitracin Zn-Polymyx] Hives  . Penicillins Hives    Has patient had a PCN reaction causing immediate rash, facial/tongue/throat swelling, SOB or lightheadedness with hypotension: no Has patient had a PCN reaction causing severe rash involving mucus membranes or skin necrosis: no Has patient had a PCN reaction that required hospitalization: no Has patient had a PCN reaction occurring within the last 10 years: no If all of the above answers are "NO", then may proceed with Cephalosporin use.    Past Medical History:  Diagnosis Date  . Allergy   . Anemia 12/22/2017    Current Outpatient Medications:  .  cetirizine (ZYRTEC) 10 MG tablet, Take 10 mg by mouth daily as needed for allergies. , Disp: , Rfl:  Social History   Socioeconomic History  . Marital status: Married    Spouse name: Not on file  . Number of children: Not on file  . Years of education: Not on file  . Highest education level: Not on file  Occupational History  . Occupation: retired  Scientific laboratory technician  . Financial resource strain: Not on file  . Food insecurity:    Worry: Not on file    Inability: Not on file  . Transportation needs:    Medical: Not on file    Non-medical: Not on file  Tobacco Use  . Smoking status: Former Smoker   Packs/day: 0.30    Years: 15.00    Pack years: 4.50    Types: Cigarettes    Last attempt to quit: 10/28/1984    Years since quitting: 33.3  . Smokeless tobacco: Never Used  . Tobacco comment: smoked "off and on" x 15 yrs  Substance and Sexual Activity  . Alcohol use: No    Alcohol/week: 0.0 oz    Comment: Holidays - occasional wine  . Drug use: No  . Sexual activity: Yes  Lifestyle  . Physical activity:    Days per week: Not on file    Minutes per session: Not on file  . Stress: Not on file  Relationships  . Social connections:    Talks on phone: Not on file    Gets together: Not on file    Attends religious service: Not on file    Active member of club or organization: Not on file    Attends meetings of clubs or organizations: Not on file    Relationship status: Not on file  . Intimate partner violence:    Fear of current or ex partner: Not on file    Emotionally abused: Not on file    Physically abused: Not on file    Forced sexual activity: Not on file  Other Topics Concern  . Not on file  Social History Narrative  . Not  on file   Family History  Problem Relation Age of Onset  . Aneurysm Mother        passed away at age 28  . Cancer Father        passed away after Prostate cancer metastesize  . Cancer Sister        she passed away 76 due to Breast Cancer  . Stroke Sister   . Aneurysm Sister     Objective: Office vital signs reviewed. BP 121/85   Pulse 81   Temp 98.1 F (36.7 C) (Oral)   Ht 5\' 6"  (1.676 m)   Wt 173 lb (78.5 kg)   BMI 27.92 kg/m   Physical Examination:  General: Awake, alert, well nourished, well appearing female, No acute distress Skin: There is a dime sized area of mild erythema surrounding a small area of skin breakdown.  There is no palpable fluctuance or induration.  Lesion is mildly tender to palpation.  No purulence or bleeding.  Lesion is located at the apex of the left inner vertical gluteal fold.  Assessment/ Plan: 67 y.o.  female   1. Soft tissue infection Appears to be a mild infection.  No significant induration or fluctuance to suggest underlying abscess.  This likely occurred after chafing.  I recommended that she wear loose clothing, cotton underwear and allow as much air out time as possible.  I have prescribed her topical Bactroban ointment to apply 3 times daily for the next 7 to 10 days.  Home care instructions reviewed.  Reasons for return discussed.  She will follow-up as needed.  Meds ordered this encounter  Medications  . mupirocin ointment (BACTROBAN) 2 %    Sig: Apply 1 application topically 3 (three) times daily. x7-10 days    Dispense:  22 g    Refill:  0     Jenna Karn Windell Moulding, DO [place] [phone]

## 2018-03-16 ENCOUNTER — Ambulatory Visit: Payer: BLUE CROSS/BLUE SHIELD | Admitting: Family Medicine

## 2018-03-16 ENCOUNTER — Encounter: Payer: Self-pay | Admitting: Family Medicine

## 2018-03-16 VITALS — BP 118/77 | HR 73 | Temp 98.0°F | Ht 66.0 in | Wt 172.0 lb

## 2018-03-16 DIAGNOSIS — Z91038 Other insect allergy status: Secondary | ICD-10-CM | POA: Diagnosis not present

## 2018-03-16 MED ORDER — HYDROXYZINE HCL 10 MG PO TABS: 5.0000 mg | ORAL_TABLET | Freq: Three times a day (TID) | ORAL | 0 refills | Status: DC | PRN

## 2018-03-16 NOTE — Progress Notes (Signed)
Subjective: CC: Horsefly bite PCP: Janora Norlander, DO CHY:[id number] A Pfahler is a 67 y.o. female presenting to clinic today for:  1. Allergic reaction Patient reports that on Saturday she sustained a bite to the right calf by a horse fly.  She notes that she typically has allergic reaction to this.  She notes some tenderness and itching along the posterior right calf.  She did have some serous fluid draining at the time of the bite but this is since resolved.  No fevers or chills.  She took one Benadryl.  Not applying ice.    ROS: Per HPI  Allergies  Allergen Reactions  . Prednisone   . Indomethacin Swelling    Face swells up  . Latex Hives  . Neosporin [Neomycin-Bacitracin Zn-Polymyx] Hives  . Penicillins Hives    Has patient had a PCN reaction causing immediate rash, facial/tongue/throat swelling, SOB or lightheadedness with hypotension: no Has patient had a PCN reaction causing severe rash involving mucus membranes or skin necrosis: no Has patient had a PCN reaction that required hospitalization: no Has patient had a PCN reaction occurring within the last 10 years: no If all of the above answers are "NO", then may proceed with Cephalosporin use.    Past Medical History:  Diagnosis Date  . Allergy   . Anemia 12/22/2017    Current Outpatient Medications:  .  cetirizine (ZYRTEC) 10 MG tablet, Take 10 mg by mouth daily as needed for allergies. , Disp: , Rfl:  .  mupirocin ointment (BACTROBAN) 2 %, Apply 1 application topically 3 (three) times daily. x7-10 days, Disp: 22 g, Rfl: 0 Social History   Socioeconomic History  . Marital status: Married    Spouse name: Not on file  . Number of children: Not on file  . Years of education: Not on file  . Highest education level: Not on file  Occupational History  . Occupation: retired  Scientific laboratory technician  . Financial resource strain: Not on file  . Food insecurity:    Worry: Not on file    Inability: Not on file  .  Transportation needs:    Medical: Not on file    Non-medical: Not on file  Tobacco Use  . Smoking status: Former Smoker    Packs/day: 0.30    Years: 15.00    Pack years: 4.50    Types: Cigarettes    Last attempt to quit: 10/28/1984    Years since quitting: 33.4  . Smokeless tobacco: Never Used  . Tobacco comment: smoked "off and on" x 15 yrs  Substance and Sexual Activity  . Alcohol use: No    Alcohol/week: 0.0 oz    Comment: Holidays - occasional wine  . Drug use: No  . Sexual activity: Yes  Lifestyle  . Physical activity:    Days per week: Not on file    Minutes per session: Not on file  . Stress: Not on file  Relationships  . Social connections:    Talks on phone: Not on file    Gets together: Not on file    Attends religious service: Not on file    Active member of club or organization: Not on file    Attends meetings of clubs or organizations: Not on file    Relationship status: Not on file  . Intimate partner violence:    Fear of current or ex partner: Not on file    Emotionally abused: Not on file    Physically abused: Not on  file    Forced sexual activity: Not on file  Other Topics Concern  . Not on file  Social History Narrative  . Not on file   Family History  Problem Relation Age of Onset  . Aneurysm Mother        passed away at age 29  . Cancer Father        passed away after Prostate cancer metastesize  . Cancer Sister        she passed away 94 due to Breast Cancer  . Stroke Sister   . Aneurysm Sister     Objective: Office vital signs reviewed. BP 118/77   Pulse 73   Temp 98 F (36.7 C) (Oral)   Ht 5\' 6"  (1.676 m)   Wt 172 lb (78 kg)   BMI 27.76 kg/m   Physical Examination:  General: Awake, alert, well nourished, No acute distress Skin: dry; large wheal appreciated along the posterior lateral aspect of the right calf.  This is approximately 2 inches in diameter.  There is a central punctum.  No exudate.  Lesion is slightly indurated.  No  palpable fluctuance.  No bleeding.  Assessment/ Plan: 67 y.o. female   1. Allergic reaction to insect bite No evidence of secondary infection.  During her office visit, the patient noted that she did not think that she is actually allergic to prednisone she thinks it was a combination of prednisone use, oxycodone use and muscle relaxer use prior to her physical therapy session that day.  She notes that she is tolerated oral and injected steroids well in the past.  We discussed that she likely did not need a steroid injection today given localization of wheal.  I did recommend that she proceed with use of Zyrtec daily.  Add hydroxyzine 10 mg every 8 hours as needed breakthrough itching.  Caution sedation.  Apply ice to the affected area to reduce swelling.  Signs and symptoms of infection reviewed with the patient.  She was good understanding of follow-up as needed.    Meds ordered this encounter  Medications  . hydrOXYzine (ATARAX/VISTARIL) 10 MG tablet    Sig: Take 0.5-1 tablets (5-10 mg total) by mouth 3 (three) times daily as needed for itching.    Dispense:  30 tablet    Refill:  [place], DO [place] [phone]

## 2018-11-19 ENCOUNTER — Other Ambulatory Visit: Payer: Self-pay | Admitting: Orthopedic Surgery

## 2018-11-19 ENCOUNTER — Other Ambulatory Visit (INDEPENDENT_AMBULATORY_CARE_PROVIDER_SITE_OTHER): Payer: Medicare Other

## 2018-11-19 DIAGNOSIS — Z96651 Presence of right artificial knee joint: Secondary | ICD-10-CM

## 2018-11-19 DIAGNOSIS — M25561 Pain in right knee: Secondary | ICD-10-CM | POA: Diagnosis not present

## 2019-03-13 IMAGING — CT CT HEAD W/O CM
3 series · 15 of 47 positions shown, 18 images · non-contrast
Comparison: CT and MR 12/22/2017.

CLINICAL DATA: 66-year-old female. Follow-up intracranial
hemorrhage. Feeling better today. Subsequent encounter.

EXAM:
CT HEAD WITHOUT CONTRAST
TECHNIQUE: Contiguous axial images were obtained from the base of the skull
through the vertex without intravenous contrast.

[Series 2: head wo · axial · 0.41mm/px · z∈[+1284,+1414]mm · 9 of 32 slices shown, 12 images]
[im 3/32  brain]
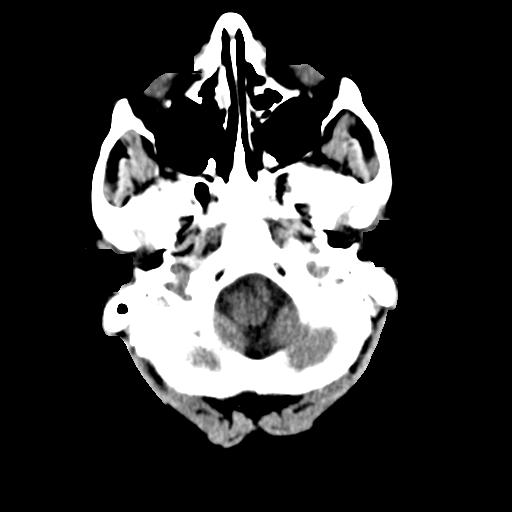
[im 3/32  bone]
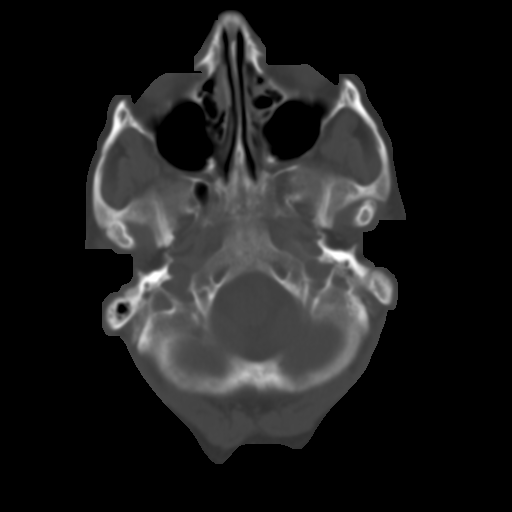
[im 6/32  brain]
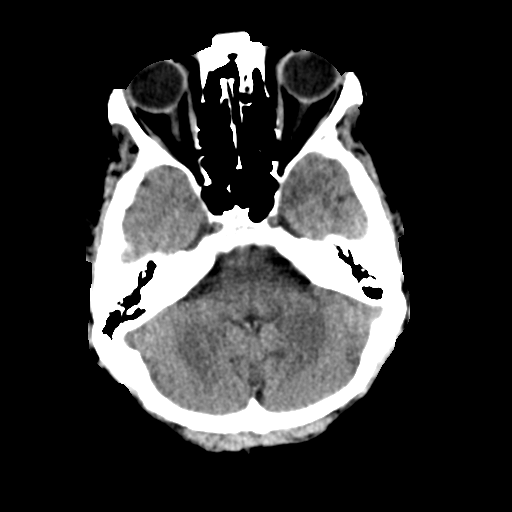
[im 9/32  brain]
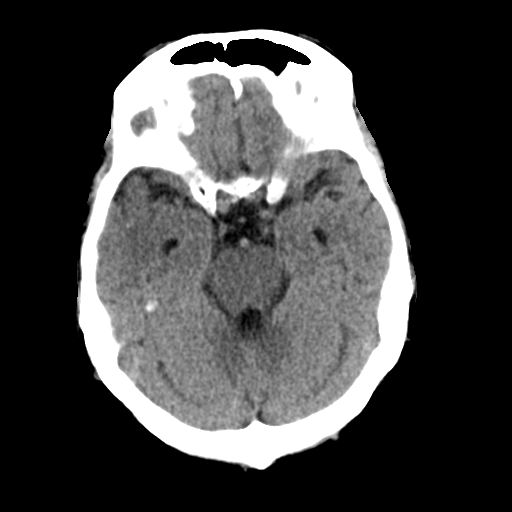
[im 12/32  brain]
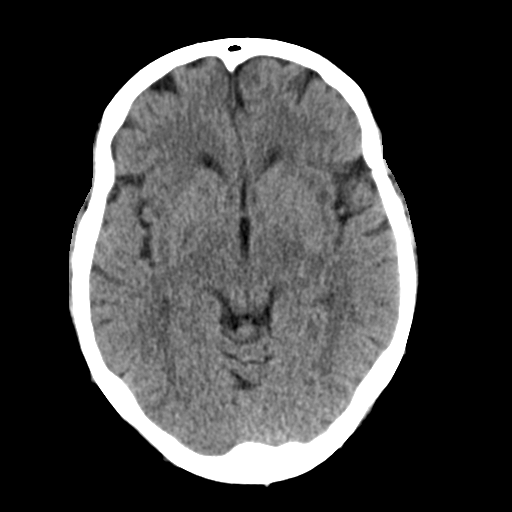
[im 17/32  brain]
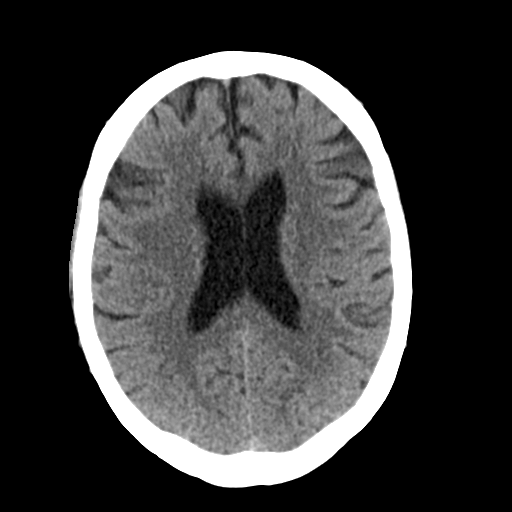
[im 17/32  bone]
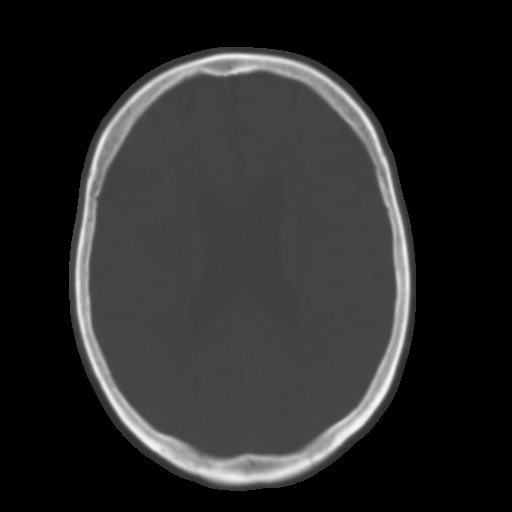
[im 20/32  brain]
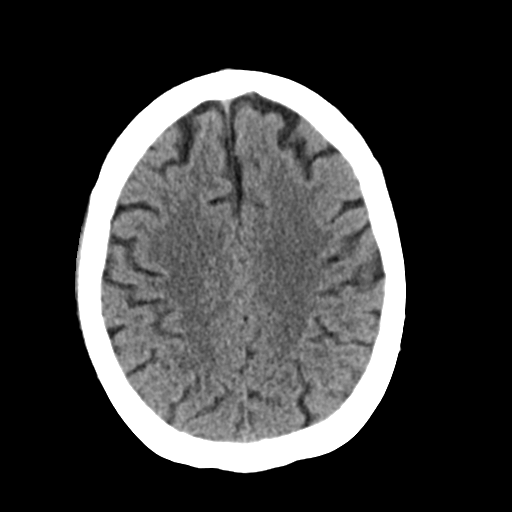
[im 23/32  brain]
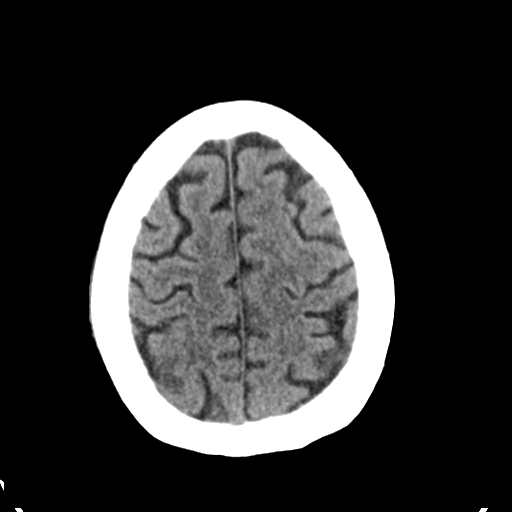
[im 26/32  brain]
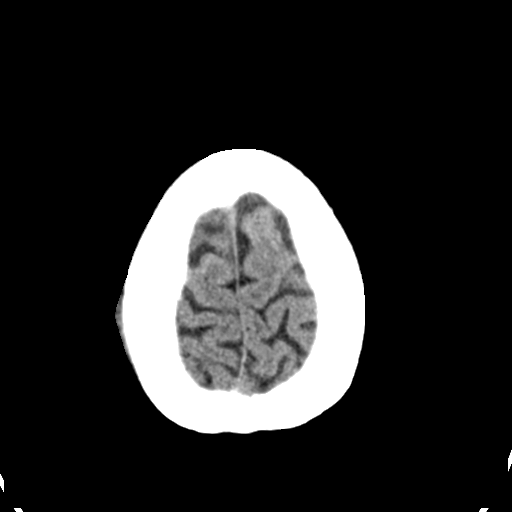
[im 29/32  brain]
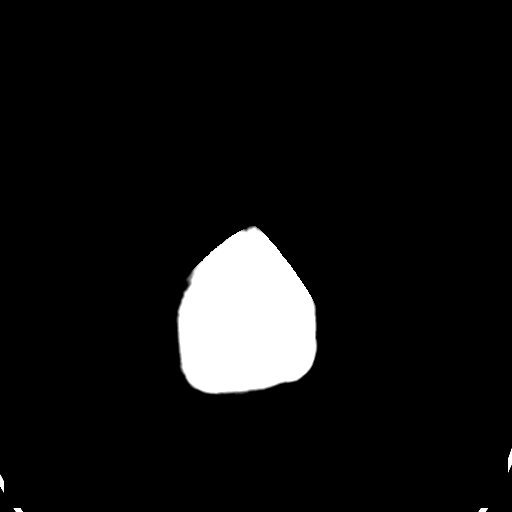
[im 29/32  bone]
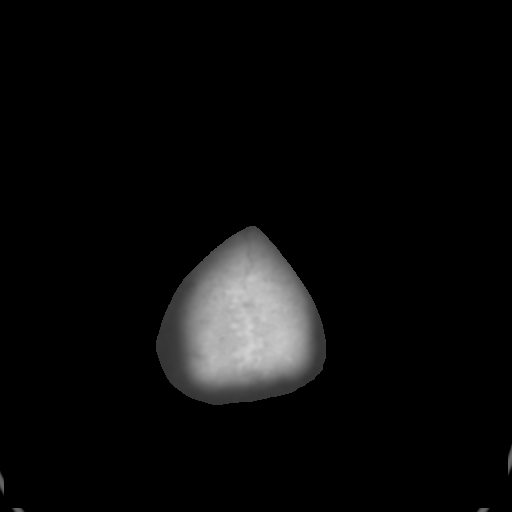

[Series 4: coronal soft tissue · coronal · 0.33mm/px · 3 of 67 slices shown]
[im 23/67  brain]
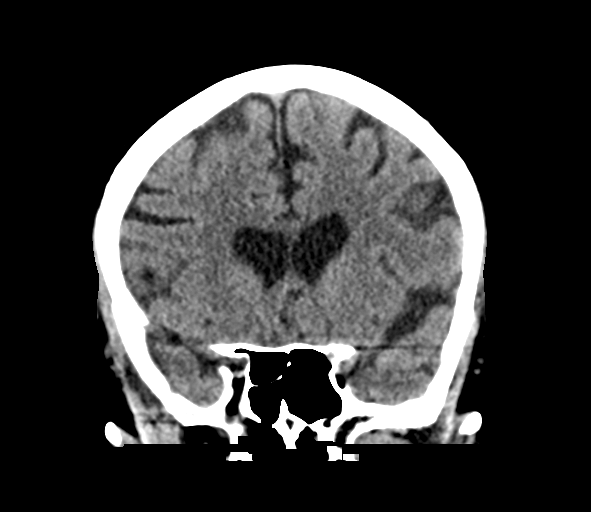
[im 30/67  brain]
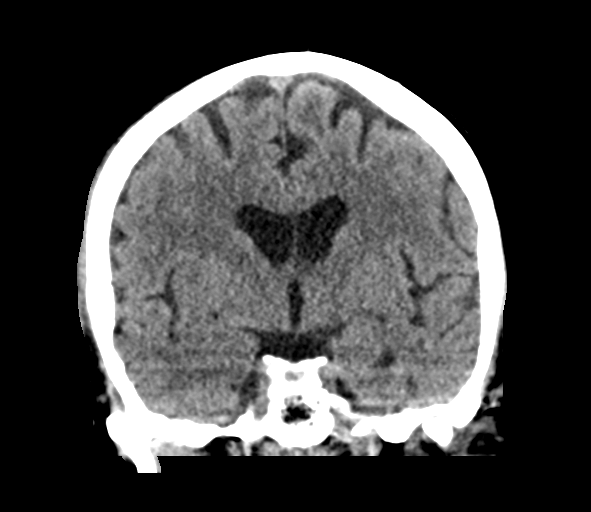
[im 37/67  brain]
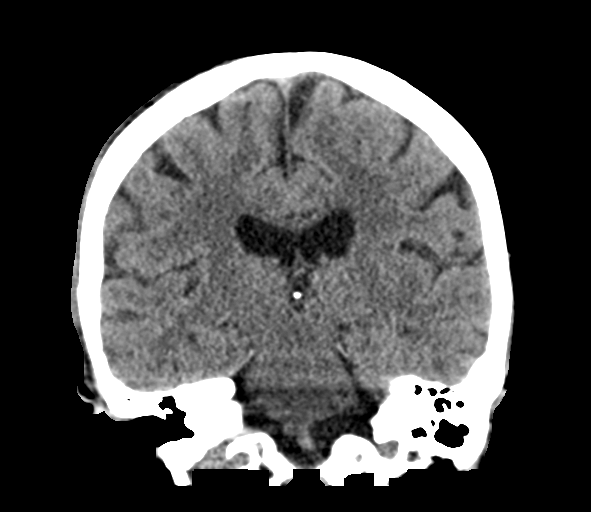

[Series 5: sagittal soft tissue · sagittal · 0.34mm/px · 3 of 55 slices shown]
[im 19/55  brain]
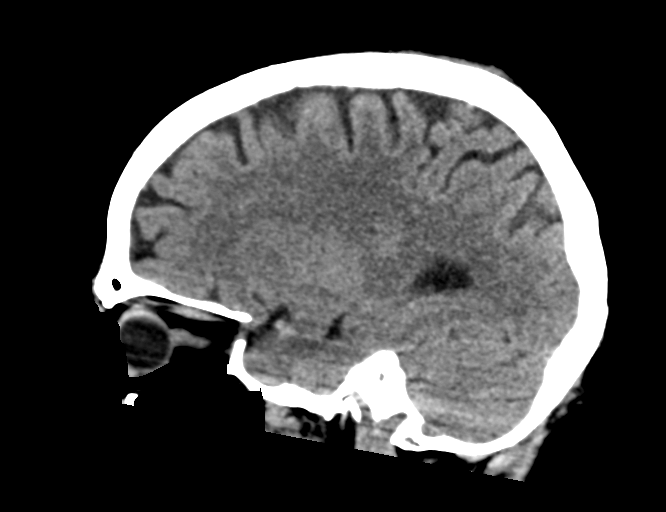
[im 28/55  brain]
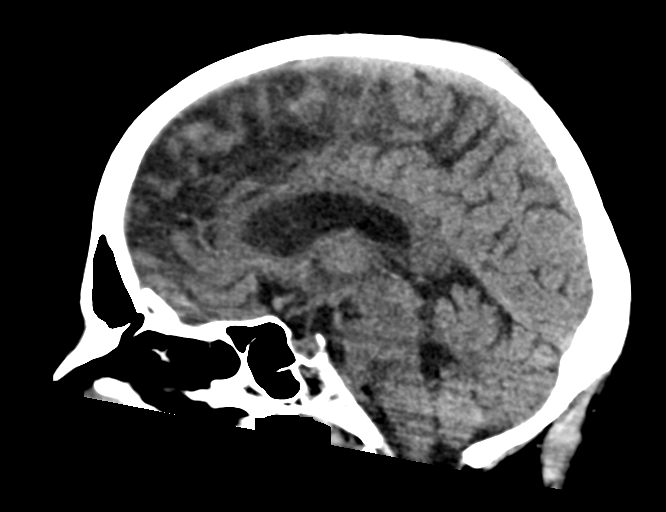
[im 37/55  brain]
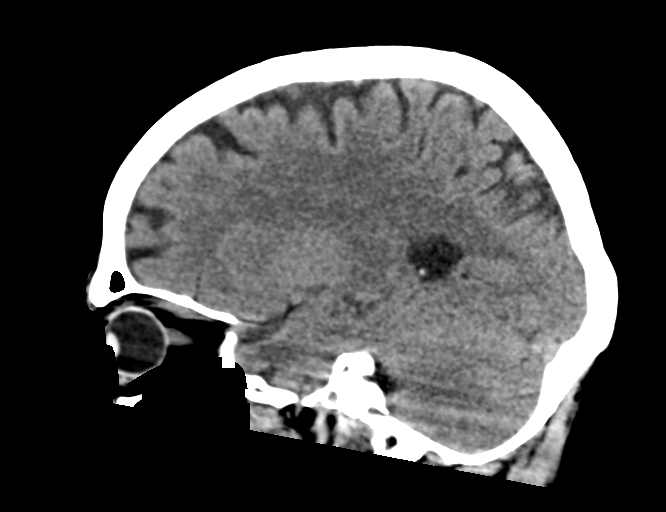

[15 of 47 positions shown; findings below may reference images not displayed]

FINDINGS: Brain: Anterior left frontal opercular 1.7 x 0.4 x 0.7 cm hematoma
with minimal amount of surrounding edema relatively similar to
yesterday's exam. No new intracranial hemorrhage identified.

No intracranial mass lesion noted on this unenhanced exam.

No CT evidence of large acute infarct.

Vascular: No acute abnormality.

Skull: Subtle nondisplaced right calvarial fracture noted on prior
exam not as well delineated on present exam.

Sinuses/Orbits: No acute orbital abnormality. Visualized paranasal
sinuses mastoid air cells and middle ear cavities are clear.

Other: Right parietal scalp hematoma once again noted.
IMPRESSION: Anterior left frontal opercular 1.7 x 0.4 x 0.7 cm hematoma with
minimal amount of surrounding edema relatively similar to
yesterday's exam. No new intracranial hemorrhage identified.

## 2019-04-09 ENCOUNTER — Ambulatory Visit (INDEPENDENT_AMBULATORY_CARE_PROVIDER_SITE_OTHER): Payer: Medicare Other | Admitting: *Deleted

## 2019-04-09 DIAGNOSIS — Z0000 Encounter for general adult medical examination without abnormal findings: Secondary | ICD-10-CM | POA: Diagnosis not present

## 2019-04-09 NOTE — Progress Notes (Signed)
MEDICARE ANNUAL WELLNESS VISIT  04/09/2019  Telephone Visit Disclaimer This Medicare AWV was conducted by telephone due to national recommendations for restrictions regarding the COVID-19 Pandemic (e.g. social distancing).  I verified, using two identifiers, that I am speaking with Jenna Smith or their authorized healthcare agent. I discussed the limitations, risks, security, and privacy concerns of performing an evaluation and management service by telephone and the potential availability of an in-person appointment in the future. The patient expressed understanding and agreed to proceed.   Subjective:  Jenna Smith is a 68 y.o. female patient of Janora Norlander, DO who had a Medicare Annual Wellness Visit today via telephone. Jenna Smith is Retired and lives with their spouse. she has 1 child. she reports that she is socially active and does interact with friends/family regularly. she is moderately physically active and enjoys gardening and doing yardwork.  Patient Care Team: Janora Norlander, DO as PCP - General (Family Medicine)  Advanced Directives 04/09/2019 12/22/2017 12/22/2017 11/19/2017 11/12/2017  Does Patient Have a Medical Advance Directive? No No No Yes Yes  Type of Advance Directive - - - Living will Living will  Does patient want to make changes to medical advance directive? - - - No - Patient declined No - Patient declined  Would patient like information on creating a medical advance directive? No - Patient declined No - Patient declined No - Patient declined - [facility] Utilization Over the Past 12 Months: # of hospitalizations or ER visits: 0 # of surgeries: 0  Review of Systems    Patient reports that her overall health is better compared to last year.  Patient Reported Readings (BP, Pulse, CBG, Weight, etc) none  Review of Systems: No complaints  All other systems negative.  Pain Assessment Pain : No/denies pain     Current  Medications & Allergies (verified) Allergies as of 04/09/2019      Reactions   Prednisone    Pt states she thinks this was due to her taking prednisone with her pain medications-has taken prednisone since and didn't have any problems   Indomethacin Swelling   Face swells up   Bee Venom    Latex Hives   Neosporin [neomycin-bacitracin Zn-polymyx] Hives   Penicillins Hives   Has patient had a PCN reaction causing immediate rash, facial/tongue/throat swelling, SOB or lightheadedness with hypotension: no Has patient had a PCN reaction causing severe rash involving mucus membranes or skin necrosis: no Has patient had a PCN reaction that required hospitalization: no Has patient had a PCN reaction occurring within the last 10 years: no If all of the above answers are "NO", then may proceed with Cephalosporin use.      Medication List       Accurate as of April 09, 2019 10:01 AM. If you have any questions, ask your nurse or doctor.        STOP taking these medications   hydrOXYzine 10 MG tablet Commonly known as: ATARAX/VISTARIL   mupirocin ointment 2 % Commonly known as: Bactroban     TAKE these medications   cetirizine 10 MG tablet Commonly known as: ZYRTEC Take 10 mg by mouth daily as needed for allergies.       History (reviewed): Past Medical History:  Diagnosis Date  . Allergy   . Anemia 12/22/2017   Past Surgical History:  Procedure Laterality Date  . BREAST SURGERY Bilateral May 2001   Breast reduction  . COSMETIC SURGERY  September 2012   Face lift  . TONSILLECTOMY    . 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 Aneurysm Mother        passed away at age 45  . Cancer Father        passed away after Prostate cancer metastesize  . Cancer Sister        she passed away 89 due to  Breast Cancer  . Stroke Sister   . Aneurysm Sister    Social History   Socioeconomic History  . Marital status: Married    Spouse name: Richardson Landry  . Number of children: 1  . Years of education: 69  . Highest education level: Some college, no degree  Occupational History  . Occupation: retired  Scientific laboratory technician  . Financial resource strain: Not hard at all  . Food insecurity    Worry: Never true    Inability: Never true  . Transportation needs    Medical: No    Non-medical: No  Tobacco Use  . Smoking status: Former Smoker    Packs/day: 0.30    Years: 15.00    Pack years: 4.50    Types: Cigarettes    Quit date: 10/28/1984    Years since quitting: 34.4  . Smokeless tobacco: Never Used  . Tobacco comment: smoked "off and on" x 15 yrs  Substance and Sexual Activity  . Alcohol use: No    Alcohol/week: 0.0 standard drinks    Comment: Holidays - occasional wine  . Drug use: No  . Sexual activity: Yes    Birth control/protection: Post-menopausal  Lifestyle  . Physical activity    Days per week: 7 days    Minutes per session: 60 min  . Stress: Not at all  Relationships  . Social connections    Talks on phone: More than three times a week    Gets together: More than three times a week    Attends religious service: Never    Active member of club or organization: No    Attends meetings of clubs or organizations: Never    Relationship status: Married  Other Topics Concern  . Not on file  Social History Narrative  . Not on file    Activities of Daily Living In your present state of health, do you have any difficulty performing the following activities: 04/09/2019  Hearing? N  Vision? N  Difficulty concentrating or making decisions? N  Walking or climbing stairs? N  Dressing or bathing? N  Doing errands, shopping? N  Preparing Food and eating ? N  Using the Toilet? N  In the past six months, have you accidently leaked urine? N  Do you have problems with loss of bowel  control? N  Managing your Medications? N  Managing your Finances? N  Housekeeping or managing your Housekeeping? N  Some recent data might be hidden    Patient Literacy How often do you need to have someone help you when you read instructions, pamphlets, or other written materials from your doctor or pharmacy?: 1 - Never What is the last grade level you completed in school?: 12th grade-1 year of college  Exercise Current Exercise Habits: Home exercise routine, Type of exercise: walking;Other - see comments(push mowing the yard, yard work-states she is always on the go), Time (Minutes): 60, Frequency (  Times/Week): 7, Weekly Exercise (Minutes/Week): 420, Intensity: Moderate, Exercise limited by: None identified  Diet Patient reports consuming 2 meals a day and 1 snack(s) a day Patient reports that her primary diet is: Regular Patient reports that she does have regular access to food.   Depression Screen PHQ 2/9 Scores 04/09/2019 03/16/2018 03/04/2018 12/18/2017 06/11/2017 12/19/2016 11/28/2016  PHQ - 2 Score 0 0 0 0 0 0 0     Fall Risk Fall Risk  04/09/2019 03/16/2018 03/04/2018 12/18/2017 06/11/2017  Falls in the past year? 0 No No No No     Objective:  Jenna Smith seemed alert and oriented and she participated appropriately during our telephone visit.  Blood Pressure Weight BMI  BP Readings from Last 3 Encounters:  03/16/18 118/77  03/04/18 121/85  12/24/17 134/64   Wt Readings from Last 3 Encounters:  03/16/18 172 lb (78 kg)  03/04/18 173 lb (78.5 kg)  12/24/17 172 lb 6.4 oz (78.2 kg)   BMI Readings from Last 1 Encounters:  03/16/18 27.76 kg/m    *Unable to obtain current vital signs, weight, and BMI due to telephone visit type  Hearing/Vision  . Jenna Smith did not seem to have difficulty with hearing/understanding during the telephone conversation . Reports that she has had a formal eye exam by an eye care professional within the past year . Reports that she has not had  a formal hearing evaluation within the past year *Unable to fully assess hearing and vision during telephone visit type  Cognitive Function: 6CIT Screen 04/09/2019  What time? 0 points  Count back from 20 0 points  Months in reverse 0 points  Repeat phrase 2 points   (Normal:0-7, Significant for Dysfunction: >8)  Normal Cognitive Function Screening: Yes   Immunization & Health Maintenance Record Immunization History  Administered Date(s) Administered  . Tdap 10/28/2013    Health Maintenance  Topic Date Due  . COLONOSCOPY  02/25/2013  . PAP SMEAR-Modifier  10/28/2016  . DEXA SCAN  04/23/2018  . PNA vac Low Risk Adult (1 of 2 - PCV13) 04/23/2018  . MAMMOGRAM  10/23/2018  . INFLUENZA VACCINE  05/29/2019  . TETANUS/TDAP  10/28/2024  . Hepatitis C Screening  Completed  . HIV Screening  Completed       Assessment  This is a routine wellness examination for Jenna Smith.  Health Maintenance: Due or Overdue Health Maintenance Due  Topic Date Due  . COLONOSCOPY  02/25/2013  . PAP SMEAR-Modifier  10/28/2016  . DEXA SCAN  04/23/2018  . PNA vac Low Risk Adult (1 of 2 - PCV13) 04/23/2018  . MAMMOGRAM  10/23/2018    Jenna Smith does not need a referral for Community Assistance: Care Management:   no Social Work:    no Prescription Assistance:  no Nutrition/Diabetes Education:  no   Plan:  Personalized Goals Goals Addressed            This Visit's Progress   . Patient Stated (pt-stated)       " I want to get the kids moved into their own house and out of mine"      Personalized Health Maintenance & Screening Recommendations  Pneumococcal vaccine  Screening mammography Colorectal cancer screening Shingles vaccine  Lung Cancer Screening Recommended: no (Low Dose CT Chest recommended if Age 53-80 years, 30 pack-year currently smoking OR have quit w/in past 15 years) Hepatitis C Screening recommended: no HIV Screening recommended: no   Advanced Directives: Written information was not prepared  per patient's request.  Referrals & Orders No orders of the defined types were placed in this encounter.   Follow-up Plan . Follow-up with Janora Norlander, DO as planned . Schedule CPE with PCP . Schedule Screening Mammogram and DEXA . Consider Prevnar and Shingles Vaccines   I have personally reviewed and noted the following in the patient's chart:   . Medical and social history . Use of alcohol, tobacco or illicit drugs  . Current medications and supplements . Functional ability and status . Nutritional status . Physical activity . Advanced directives . List of other physicians . Hospitalizations, surgeries, and ER visits in previous 12 months . Vitals . Screenings to include cognitive, depression, and falls . Referrals and appointments  In addition, I have reviewed and discussed with Jenna Smith certain preventive protocols, quality metrics, and best practice recommendations. A written personalized care plan for preventive services as well as general preventive health recommendations is available and can be mailed to the patient at her request.      Jenna Crosby, LPN  11/12/5788

## 2019-04-09 NOTE — Patient Instructions (Signed)
Preventive Care 66 Years and Older, Female Preventive care refers to lifestyle choices and visits with your health care provider that can promote health and wellness. What does preventive care include?  A yearly physical exam. This is also called an annual well check.  Dental exams once or twice a year.  Routine eye exams. Ask your health care provider how often you should have your eyes checked.  Personal lifestyle choices, including: ? Daily care of your teeth and gums. ? Regular physical activity. ? Eating a healthy diet. ? Avoiding tobacco and drug use. ? Limiting alcohol use. ? Practicing safe sex. ? Taking low-dose aspirin every day. ? Taking vitamin and mineral supplements as recommended by your health care provider. What happens during an annual well check? The services and screenings done by your health care provider during your annual well check will depend on your age, overall health, lifestyle risk factors, and family history of disease. Counseling Your health care provider may ask you questions about your:  Alcohol use.  Tobacco use.  Drug use.  Emotional well-being.  Home and relationship well-being.  Sexual activity.  Eating habits.  History of falls.  Memory and ability to understand (cognition).  Work and work Statistician.  Reproductive health.  Screening You may have the following tests or measurements:  Height, weight, and BMI.  Blood pressure.  Lipid and cholesterol levels. These may be checked every 5 years, or more frequently if you are over 30 years old.  Skin check.  Lung cancer screening. You may have this screening every year starting at age 27 if you have a 30-pack-year history of smoking and currently smoke or have quit within the past 15 years.  Colorectal cancer screening. All adults should have this screening starting at age 33 and continuing until age 46. You will have tests every 1-10 years, depending on your results and the  type of screening test. People at increased risk should start screening at an earlier age. Screening tests may include: ? Guaiac-based fecal occult blood testing. ? Fecal immunochemical test (FIT). ? Stool DNA test. ? Virtual colonoscopy. ? Sigmoidoscopy. During this test, a flexible tube with a tiny camera (sigmoidoscope) is used to examine your rectum and lower colon. The sigmoidoscope is inserted through your anus into your rectum and lower colon. ? Colonoscopy. During this test, a long, thin, flexible tube with a tiny camera (colonoscope) is used to examine your entire colon and rectum.  Hepatitis C blood test.  Hepatitis B blood test.  Sexually transmitted disease (STD) testing.  Diabetes screening. This is done by checking your blood sugar (glucose) after you have not eaten for a while (fasting). You may have this done every 1-3 years.  Bone density scan. This is done to screen for osteoporosis. You may have this done starting at age 37.  Mammogram. This may be done every 1-2 years. Talk to your health care provider about how often you should have regular mammograms. Talk with your health care provider about your test results, treatment options, and if necessary, the need for more tests. Vaccines Your health care provider may recommend certain vaccines, such as:  Influenza vaccine. This is recommended every year.  Tetanus, diphtheria, and acellular pertussis (Tdap, Td) vaccine. You may need a Td booster every 10 years.  Varicella vaccine. You may need this if you have not been vaccinated.  Zoster vaccine. You may need this after age 38.  Measles, mumps, and rubella (MMR) vaccine. You may need at least  one dose of MMR if you were born in 1957 or later. You may also need a second dose.  Pneumococcal 13-valent conjugate (PCV13) vaccine. One dose is recommended after age 24.  Pneumococcal polysaccharide (PPSV23) vaccine. One dose is recommended after age 24.  Meningococcal  vaccine. You may need this if you have certain conditions.  Hepatitis A vaccine. You may need this if you have certain conditions or if you travel or work in places where you may be exposed to hepatitis A.  Hepatitis B vaccine. You may need this if you have certain conditions or if you travel or work in places where you may be exposed to hepatitis B.  Haemophilus influenzae type b (Hib) vaccine. You may need this if you have certain conditions. Talk to your health care provider about which screenings and vaccines you need and how often you need them. This information is not intended to replace advice given to you by your health care provider. Make sure you discuss any questions you have with your health care provider. Document Released: 11/10/2015 Document Revised: 12/04/2017 Document Reviewed: 08/15/2015 Elsevier Interactive Patient Education  2019 [employer].

## 2019-05-25 DIAGNOSIS — Z1211 Encounter for screening for malignant neoplasm of colon: Secondary | ICD-10-CM | POA: Diagnosis not present

## 2019-09-14 ENCOUNTER — Encounter: Payer: Self-pay | Admitting: Family Medicine

## 2020-01-18 DIAGNOSIS — M62830 Muscle spasm of back: Secondary | ICD-10-CM | POA: Diagnosis not present

## 2020-01-18 DIAGNOSIS — M9902 Segmental and somatic dysfunction of thoracic region: Secondary | ICD-10-CM | POA: Diagnosis not present

## 2020-01-18 DIAGNOSIS — M9903 Segmental and somatic dysfunction of lumbar region: Secondary | ICD-10-CM | POA: Diagnosis not present

## 2020-01-18 DIAGNOSIS — M9901 Segmental and somatic dysfunction of cervical region: Secondary | ICD-10-CM | POA: Diagnosis not present

## 2020-01-20 DIAGNOSIS — M9903 Segmental and somatic dysfunction of lumbar region: Secondary | ICD-10-CM | POA: Diagnosis not present

## 2020-01-20 DIAGNOSIS — M9902 Segmental and somatic dysfunction of thoracic region: Secondary | ICD-10-CM | POA: Diagnosis not present

## 2020-01-20 DIAGNOSIS — M62830 Muscle spasm of back: Secondary | ICD-10-CM | POA: Diagnosis not present

## 2020-01-20 DIAGNOSIS — M9901 Segmental and somatic dysfunction of cervical region: Secondary | ICD-10-CM | POA: Diagnosis not present

## 2020-01-24 DIAGNOSIS — M9901 Segmental and somatic dysfunction of cervical region: Secondary | ICD-10-CM | POA: Diagnosis not present

## 2020-01-24 DIAGNOSIS — M9903 Segmental and somatic dysfunction of lumbar region: Secondary | ICD-10-CM | POA: Diagnosis not present

## 2020-01-24 DIAGNOSIS — M62830 Muscle spasm of back: Secondary | ICD-10-CM | POA: Diagnosis not present

## 2020-01-24 DIAGNOSIS — M9902 Segmental and somatic dysfunction of thoracic region: Secondary | ICD-10-CM | POA: Diagnosis not present

## 2020-01-27 DIAGNOSIS — M9902 Segmental and somatic dysfunction of thoracic region: Secondary | ICD-10-CM | POA: Diagnosis not present

## 2020-01-27 DIAGNOSIS — M9903 Segmental and somatic dysfunction of lumbar region: Secondary | ICD-10-CM | POA: Diagnosis not present

## 2020-01-27 DIAGNOSIS — M9901 Segmental and somatic dysfunction of cervical region: Secondary | ICD-10-CM | POA: Diagnosis not present

## 2020-01-27 DIAGNOSIS — M62830 Muscle spasm of back: Secondary | ICD-10-CM | POA: Diagnosis not present

## 2020-03-02 DIAGNOSIS — S069X0D Unspecified intracranial injury without loss of consciousness, subsequent encounter: Secondary | ICD-10-CM | POA: Diagnosis not present

## 2020-03-02 DIAGNOSIS — R42 Dizziness and giddiness: Secondary | ICD-10-CM | POA: Diagnosis not present

## 2020-03-02 DIAGNOSIS — S069XAA Unspecified intracranial injury with loss of consciousness status unknown, initial encounter: Secondary | ICD-10-CM | POA: Insufficient documentation

## 2020-03-09 DIAGNOSIS — M62830 Muscle spasm of back: Secondary | ICD-10-CM | POA: Diagnosis not present

## 2020-03-09 DIAGNOSIS — M9902 Segmental and somatic dysfunction of thoracic region: Secondary | ICD-10-CM | POA: Diagnosis not present

## 2020-03-09 DIAGNOSIS — M9903 Segmental and somatic dysfunction of lumbar region: Secondary | ICD-10-CM | POA: Diagnosis not present

## 2020-03-09 DIAGNOSIS — M9901 Segmental and somatic dysfunction of cervical region: Secondary | ICD-10-CM | POA: Diagnosis not present

## 2020-03-10 ENCOUNTER — Other Ambulatory Visit: Payer: Self-pay

## 2020-03-10 ENCOUNTER — Ambulatory Visit: Payer: Medicare Other | Attending: Student | Admitting: Physical Therapy

## 2020-03-10 DIAGNOSIS — H8112 Benign paroxysmal vertigo, left ear: Secondary | ICD-10-CM | POA: Diagnosis not present

## 2020-03-10 DIAGNOSIS — R42 Dizziness and giddiness: Secondary | ICD-10-CM | POA: Diagnosis not present

## 2020-03-10 NOTE — Therapy (Signed)
[facility] [address] [place], [place], [address] Phone: [phone]   Fax:  [phone]  Physical Therapy Evaluation  Patient Details  Name: Jenna Smith MRN: [id number] Date of Birth: [birth year] Referring Provider (PT): Val Eagle, NP   Encounter Date: 03/10/2020  PT End of Session - 03/10/20 1104    Visit Number  1    Number of Visits  4    Date for PT Re-Evaluation  04/09/20    Authorization Type  [employer] 2021 $30 copay    PT Start Time  1023    PT Stop Time  1100    PT Time Calculation (min)  37 min    Activity Tolerance  Patient tolerated treatment well    Behavior During Therapy  [facility] for tasks assessed/performed       Past Medical History:  Diagnosis Date  . Allergy   . Anemia 12/22/2017    Past Surgical History:  Procedure Laterality Date  . BREAST SURGERY Bilateral May 2001   Breast reduction  . COSMETIC SURGERY  September 2012   Face lift  . TONSILLECTOMY    . 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   Procedure: RIGHT TOTAL KNEE ARTHROPLASTY;  Surgeon: Ranee Gosselin, MD;  Location: [facility];  Service: Orthopedics;  Laterality: Right;    There were no vitals filed for this visit.   Subjective Assessment - 03/10/20 1026    Subjective  2019 pt experienced a skull fracture due to side effects of medications.  Back in October pt had 2 falls due to slipping on the floor.  Pt experiences lightheadedness when reaching up.  Has been seeing her chiropractor for cervical adjustments and the dizziness has been better.  Thinks it may be related to her neck.    Pertinent History  anemia, falls, R TKA 2019    Diagnostic tests  MRI has been ordered    Patient Stated Goals  to address her dizziness    Currently in Pain?  No/denies         [facility] PT Assessment - 03/10/20 1031      Assessment   Medical Diagnosis  Dizziness    Referring Provider (PT)  Val Eagle, NP    Onset Date/Surgical  Date  03/07/20    Prior Therapy  yes for TKA      Precautions   Precautions  Other (comment)    Precaution Comments  anemia, falls, R TKA 2019      Balance Screen   Has the patient fallen in the past 6 months  Yes    How many times?  2      Home Environment   Living Environment  Private residence    Living Arrangements  Spouse/significant other    Type of Home  House    Additional Comments  Pt very active doing house improvement and yard work, pool maintenance      Prior Function   Level of Independence  Independent      Observation/Other Assessments   Focus on Therapeutic Outcomes (FOTO)   Not assessed - malfunction of website      Sensation   Light Touch  Appears Intact      ROM / Strength   AROM / PROM / Strength  Strength      Strength   Overall Strength  Within functional limits for tasks performed             Vestibular Assessment - 03/10/20 1034  Symptom Behavior   Subjective history of current problem  Denies headaches, changes in vision or hearing, denies N&V, denies difficulty swallowing    Type of Dizziness   Lightheadedness    Frequency of Dizziness  intermittent    Duration of Dizziness  short duration    Symptom Nature  Spontaneous    Aggravating Factors  Turning body quickly;Turning head quickly;Looking up to the ceiling    Relieving Factors  Comments   chiropractor   Progression of Symptoms  Better      Oculomotor Exam   Oculomotor Alignment  Normal    Ocular ROM  WFL    Spontaneous  Absent    Gaze-induced   Left beating nystagmus with L gaze    Smooth Pursuits  Intact    Saccades  Intact      Oculomotor Exam-Fixation Suppressed    Left Head Impulse  negative    Right Head Impulse  negative      Vestibulo-Ocular Reflex   VOR to Slow Head Movement  Normal    VOR Cancellation  Normal      Positional Testing   Dix-Hallpike  Dix-Hallpike Right;Dix-Hallpike Left    Horizontal Canal Testing  Horizontal Canal Right;Horizontal Canal  Left      Dix-Hallpike Right   Dix-Hallpike Right Duration  0    Dix-Hallpike Right Symptoms  No nystagmus      Dix-Hallpike Left   Dix-Hallpike Left Duration  3 seconds    Dix-Hallpike Left Symptoms  Upbeat, left rotatory nystagmus      Horizontal Canal Right   Horizontal Canal Right Duration  0    Horizontal Canal Right Symptoms  Normal      Horizontal Canal Left   Horizontal Canal Left Duration  0    Horizontal Canal Left Symptoms  Normal          Objective measurements completed on examination: See above findings.       Vestibular Treatment/Exercise - 03/10/20 1048      Vestibular Treatment/Exercise   Vestibular Treatment Provided  Canalith Repositioning    Canalith Repositioning  Epley Manuever Left       EPLEY MANUEVER LEFT   Number of Reps   1    Overall Response   Symptoms Resolved            PT Education - 03/10/20 1101    Education Details  clinical findings, BPPV, no need for f/u visits at this time unless symptoms return    Person(s) Educated  Patient    Methods  Explanation;Demonstration;Handout    Comprehension  Verbalized understanding;Returned demonstration       PT Short Term Goals - 03/10/20 1104      PT SHORT TERM GOAL #1   Title  = LTG        PT Long Term Goals - 03/10/20 1213      PT LONG TERM GOAL #1   Title  Pt will demosntrate negative positional testing for all canals    Time  4    Period  Weeks    Status  New    Target Date  04/09/20      PT LONG TERM GOAL #2   Title  Pt will report 0/5 dizziness with looking up    Time  4    Period  Weeks    Status  New    Target Date  04/09/20      PT LONG TERM GOAL #3   Title  Pt will  return demonstrate home maneuvers for self treatment of BPPV.    Time  4    Period  Weeks    Status  New    Target Date  04/09/20             Plan - 03/10/20 1206    Clinical Impression Statement  Pt is a 69 year old female referred to Neuro OPPT for evaluation of vertigo.  Pt's  PMH is significant for the following: anemia, allergies, R TKA and falls. The following deficits were noted during pt's exam: vertigo and L rotary, upbeating nystagmus of short duration during L hallpike-dix indicating L posterior canal canalithiasis, treated x 1 with CRM with full resolution of symptoms.  No follow up visits scheduled today but pt educated on risk of reoccurrence and pt advised to contact therapist if symptoms return due to patient's high activity level and frequency of climbing heights.  If pt returns will educate pt on home maneuvers.  If pt does not have reoccurrence of symptoms, will D/C patient after 30 days.    Personal Factors and Comorbidities  Comorbidity 2;Other   performs a lot of house and yard work; very active, on tall heights   Comorbidities  anemia, falls, R TKA 2019    Examination-Activity Limitations  Bed Mobility;Reach Overhead    Examination-Participation Restrictions  Cleaning;Yard Work    Stability/Clinical Decision Making  Stable/Uncomplicated    Clinical Decision Making  Low    Rehab Potential  Excellent    PT Frequency  1x / week    PT Duration  4 weeks    PT Treatment/Interventions  ADLs/Self Care Home Management;Canalith Repostioning;Neuromuscular re-education;Patient/family education;Therapeutic activities;Vestibular    PT Next Visit Plan  if pt returns reassess for L BPPV and treat if indicated.  D/C    Consulted and Agree with Plan of Care  Patient       Patient will benefit from skilled therapeutic intervention in order to improve the following deficits and impairments:  Dizziness  Visit Diagnosis: BPPV (benign paroxysmal positional vertigo), left  Dizziness and giddiness     Problem List Patient Active Problem List   Diagnosis Date Noted  . 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 Syncope 12/22/2017  . Hypokalemia 12/22/2017  . Anemia 12/22/2017  . 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 Tendonitis of knee, right  06/11/2017  . Obesity (BMI 30-39.9) 06/21/2016  . Allergic rhinitis 02/26/2016  . Right knee pain 02/26/2016    Dierdre Highman, PT, DPT 03/10/20    12:17 PM    [facility] [address] [place], [place], [address] Phone: [phone]   Fax:  [phone]  Name: Jenna Smith MRN: [id number] Date of Birth: 10/13/1951

## 2020-03-13 DIAGNOSIS — M62830 Muscle spasm of back: Secondary | ICD-10-CM | POA: Diagnosis not present

## 2020-03-13 DIAGNOSIS — M9903 Segmental and somatic dysfunction of lumbar region: Secondary | ICD-10-CM | POA: Diagnosis not present

## 2020-03-13 DIAGNOSIS — M9902 Segmental and somatic dysfunction of thoracic region: Secondary | ICD-10-CM | POA: Diagnosis not present

## 2020-03-13 DIAGNOSIS — M9901 Segmental and somatic dysfunction of cervical region: Secondary | ICD-10-CM | POA: Diagnosis not present

## 2020-03-15 DIAGNOSIS — M9902 Segmental and somatic dysfunction of thoracic region: Secondary | ICD-10-CM | POA: Diagnosis not present

## 2020-03-15 DIAGNOSIS — M62830 Muscle spasm of back: Secondary | ICD-10-CM | POA: Diagnosis not present

## 2020-03-15 DIAGNOSIS — M9901 Segmental and somatic dysfunction of cervical region: Secondary | ICD-10-CM | POA: Diagnosis not present

## 2020-03-15 DIAGNOSIS — M9903 Segmental and somatic dysfunction of lumbar region: Secondary | ICD-10-CM | POA: Diagnosis not present

## 2020-03-16 DIAGNOSIS — I619 Nontraumatic intracerebral hemorrhage, unspecified: Secondary | ICD-10-CM | POA: Diagnosis not present

## 2020-03-16 DIAGNOSIS — S069X0A Unspecified intracranial injury without loss of consciousness, initial encounter: Secondary | ICD-10-CM | POA: Diagnosis not present

## 2020-03-21 DIAGNOSIS — Z96651 Presence of right artificial knee joint: Secondary | ICD-10-CM | POA: Diagnosis not present

## 2020-04-17 ENCOUNTER — Ambulatory Visit (INDEPENDENT_AMBULATORY_CARE_PROVIDER_SITE_OTHER): Payer: Medicare Other | Admitting: Family Medicine

## 2020-04-17 ENCOUNTER — Ambulatory Visit: Payer: Medicare Other | Admitting: Family Medicine

## 2020-04-17 ENCOUNTER — Telehealth: Payer: Self-pay | Admitting: Family Medicine

## 2020-04-17 ENCOUNTER — Encounter: Payer: Self-pay | Admitting: Family Medicine

## 2020-04-17 DIAGNOSIS — W57XXXA Bitten or stung by nonvenomous insect and other nonvenomous arthropods, initial encounter: Secondary | ICD-10-CM

## 2020-04-17 DIAGNOSIS — Z91038 Other insect allergy status: Secondary | ICD-10-CM | POA: Diagnosis not present

## 2020-04-17 MED ORDER — DOXYCYCLINE HYCLATE 100 MG PO TABS: ORAL_TABLET | ORAL | 0 refills | Status: DC

## 2020-04-17 NOTE — Telephone Encounter (Signed)
Patient has a follow up appointment scheduled. 

## 2020-04-17 NOTE — Telephone Encounter (Signed)
Pt scheduled for televisit today for tick bites she is worried about. Wants to know if any provider can see her in person today if possible. Pt worried about tick bites because she says she just had a knee replacement and was told that bites needed to be looked at so infection doesn't develop, in case she caught something from the tick bites.

## 2020-04-17 NOTE — Progress Notes (Signed)
Telephone visit  Subjective: CC: tick bites PCP: Janora Norlander, DO [id number] Jenna Smith is Jenna 69 y.o. female calls for telephone consult today. Patient provides verbal consent for consult held via phone.  Due to COVID-19 pandemic this visit was conducted virtually. This visit type was conducted due to national recommendations for restrictions regarding the COVID-19 Pandemic (e.g. social distancing, sheltering in place) in an effort to limit this patient's exposure and mitigate transmission in our community. All issues noted in this document were discussed and addressed.  Jenna physical exam was not performed with this format.   Location of patient: home Location of provider: [facility] Others present for call: none  1. Tick bites She took benadryl but it wires her up.  She has been applying cortisone cream.  This does seem to be improving symptoms.  She was worried because she has history of knee replacement back in 2019 wanted to make sure that there was no infection seeding and that she may need antibiotics for.  Of note, tick bites were sustained sometime on Wednesday or Thursday.  She is having no myalgia, no arthralgia, no fevers, no chills, no vomiting, no headache, no new onset fatigue or bull's-eye rash.   ROS: Per HPI  Allergies  Allergen Reactions  . Prednisone     Pt states she thinks this was due to her taking prednisone with her pain medications-has taken prednisone since and didn't have any problems  . Indomethacin Swelling    Face swells up  . Bee Venom   . Latex Hives  . Neosporin [Neomycin-Bacitracin Zn-Polymyx] Hives  . Penicillins Hives    Has patient had Jenna PCN reaction causing immediate rash, facial/tongue/throat swelling, SOB or lightheadedness with hypotension: no Has patient had Jenna PCN reaction causing severe rash involving mucus membranes or skin necrosis: no Has patient had Jenna PCN reaction that required hospitalization: no Has patient had Jenna PCN reaction occurring  within the last 10 years: no If all of the above answers are "NO", then may proceed with Cephalosporin use.    Past Medical History:  Diagnosis Date  . Allergy   . Anemia 12/22/2017    Current Outpatient Medications:  .  cetirizine (ZYRTEC) 10 MG tablet, Take 10 mg by mouth daily as needed for allergies. , Disp: , Rfl:   Assessment/ Plan: 69 y.o. female   1. Tick bite, initial encounter No evidence of infection.  Appears to be an allergic reaction.  Ok to continue Zyrtec and topical cortisone cream.  Red flags discussed.  PRN prophylaxis sent. - doxycycline (VIBRA-TABS) 100 MG tablet; Take 2 tablets as Jenna single dose as needed for tick bites.  MUST take within 48 hours of bite.  Dispense: 14 tablet; Refill: 0  2. Allergic reaction to insect bite  Start time: 10:06am End time: 10:12 am  Total time spent on patient care (including telephone call/ virtual visit): 11 minutes  [place], [place] [phone]

## 2020-04-17 NOTE — Telephone Encounter (Signed)
Patient has scheduled a televisit.

## 2020-04-17 NOTE — Patient Instructions (Signed)

## 2020-04-20 ENCOUNTER — Ambulatory Visit (INDEPENDENT_AMBULATORY_CARE_PROVIDER_SITE_OTHER): Payer: Medicare Other

## 2020-04-20 DIAGNOSIS — Z780 Asymptomatic menopausal state: Secondary | ICD-10-CM | POA: Diagnosis not present

## 2020-04-20 DIAGNOSIS — Z0000 Encounter for general adult medical examination without abnormal findings: Secondary | ICD-10-CM | POA: Diagnosis not present

## 2020-04-20 DIAGNOSIS — Z1231 Encounter for screening mammogram for malignant neoplasm of breast: Secondary | ICD-10-CM

## 2020-04-20 NOTE — Patient Instructions (Addendum)
  [place] Maintenance Summary and Written Plan of Care  Ms. Carin Primrose ,  Thank you for allowing me to perform your Medicare Annual Wellness Visit and for your ongoing commitment to your health.   Health Maintenance & Immunization History Health Maintenance  Topic Date Due  . COVID-19 Vaccine (1) Never done  . COLONOSCOPY  02/25/2013  . DEXA SCAN  Never done  . PNA vac Low Risk Adult (1 of 2 - PCV13) Never done  . MAMMOGRAM  10/23/2018  . INFLUENZA VACCINE  05/28/2020  . TETANUS/TDAP  10/28/2024  . Hepatitis C Screening  Completed   Immunization History  Administered Date(s) Administered  . Tdap 10/28/2013    These are the patient goals that we discussed: Goals Addressed              This Visit's Progress     Patient Stated   .  Patient Stated (pt-stated)   On track     " I want to get the kids moved into their own house and out of mine"        This is a list of Health Maintenance Items that are overdue or due now: Health Maintenance Due  Topic Date Due  . COVID-19 Vaccine (1) Never done  . COLONOSCOPY  02/25/2013  . DEXA SCAN  Never done  . PNA vac Low Risk Adult (1 of 2 - PCV13) Never done  . MAMMOGRAM  10/23/2018     Orders/Referrals Placed Today: No orders of the defined types were placed in this encounter.  (Contact our referral department at [phone] if you have not spoken with someone about your referral appointment within the next 5 days)    Follow-up Plan  Mammogram scheduled for 06/21/2020 at 1:35pm  We will be in touch with you about scheduling your bone density scan.

## 2020-04-20 NOTE — Progress Notes (Signed)
MEDICARE ANNUAL WELLNESS VISIT  04/20/2020  Telephone Visit Disclaimer This Medicare AWV was conducted by telephone due to national recommendations for restrictions regarding the COVID-19 Pandemic (e.g. social distancing).  I verified, using two identifiers, that I am speaking with Mila Homer or their authorized healthcare agent. I discussed the limitations, risks, security, and privacy concerns of performing an evaluation and management service by telephone and the potential availability of an in-person appointment in the future. The patient expressed understanding and agreed to proceed.   Subjective:  Ronna A Amaral is a 69 y.o. female patient of Janora Norlander, DO who had a Medicare Annual Wellness Visit today via telephone. Emonnie is Retired and lives with their spouse. she has one child. she reports that she is socially active and does interact with friends/family regularly. she is minimally physically active and enjoys gardening.  Patient Care Team: Janora Norlander, DO as PCP - General (Family Medicine)  Advanced Directives 04/20/2020 04/09/2019 12/22/2017 12/22/2017 11/19/2017 11/12/2017  Does Patient Have a Medical Advance Directive? Yes No No No Yes Yes  Type of Advance Directive Living will - - - Living will Living will  Does patient want to make changes to medical advance directive? No - Patient declined - - - No - Patient declined No - Patient declined  Would patient like information on creating a medical advance directive? No - Patient declined No - Patient declined No - Patient declined No - Patient declined - -    Hospital Utilization Over the Past 12 Months: # of hospitalizations or ER visits: 0 # of surgeries: 0  Review of Systems    Patient reports that her overall health is unchanged compared to last year.   Patient Reported Readings (BP, Pulse, CBG, Weight, etc) none  Pain Assessment Pain : No/denies pain     Current Medications &  Allergies (verified) Allergies as of 04/20/2020      Reactions   Prednisone    Pt states she thinks this was due to her taking prednisone with her pain medications-has taken prednisone since and didn't have any problems   Indomethacin Swelling   Face swells up   Bee Venom    Latex Hives   Neosporin [neomycin-bacitracin Zn-polymyx] Hives   Penicillins Hives   Has patient had a PCN reaction causing immediate rash, facial/tongue/throat swelling, SOB or lightheadedness with hypotension: no Has patient had a PCN reaction causing severe rash involving mucus membranes or skin necrosis: no Has patient had a PCN reaction that required hospitalization: no Has patient had a PCN reaction occurring within the last 10 years: no If all of the above answers are "NO", then may proceed with Cephalosporin use.      Medication List       Accurate as of April 20, 2020 10:24 AM. If you have any questions, ask your nurse or doctor.        cetirizine 10 MG tablet Commonly known as: ZYRTEC Take 10 mg by mouth daily as needed for allergies.   doxycycline 100 MG tablet Commonly known as: VIBRA-TABS Take 2 tablets as a single dose as needed for tick bites.  MUST take within 48 hours of bite.       History (reviewed): Past Medical History:  Diagnosis Date  . Allergy   . Anemia 12/22/2017   Past Surgical History:  Procedure Laterality Date  . BREAST SURGERY Bilateral May 2001   Breast reduction  . COSMETIC SURGERY  September 2012  Face lift  . TONSILLECTOMY    . 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 Aneurysm Mother        passed away at age 103  . Cancer Father        passed away after Prostate cancer metastesize  . Cancer Sister        she passed away 67 due to Breast Cancer  . Stroke Sister   .  Aneurysm Sister    Social History   Socioeconomic History  . Marital status: Married    Spouse name: Richardson Landry  . Number of children: 1  . Years of education: 41  . Highest education level: Some college, no degree  Occupational History  . Occupation: retired  Tobacco Use  . Smoking status: Former Smoker    Packs/day: 0.30    Years: 15.00    Pack years: 4.50    Types: Cigarettes    Quit date: 10/28/1984    Years since quitting: 35.5  . Smokeless tobacco: Never Used  . Tobacco comment: smoked "off and on" x 15 yrs  Vaping Use  . Vaping Use: Never used  Substance and Sexual Activity  . Alcohol use: No    Alcohol/week: 0.0 standard drinks    Comment: Holidays - occasional wine  . Drug use: No  . Sexual activity: Yes    Birth control/protection: Post-menopausal  Other Topics Concern  . Not on file  Social History Narrative  . Not on file   Social Determinants of Health   Financial Resource Strain:   . Difficulty of Paying Living Expenses:   Food Insecurity:   . Worried About Charity fundraiser in the Last Year:   . Arboriculturist in the Last Year:   Transportation Needs:   . Film/video editor (Medical):   Marland Kitchen Lack of Transportation (Non-Medical):   Physical Activity:   . Days of Exercise per Week:   . Minutes of Exercise per Session:   Stress:   . Feeling of Stress :   Social Connections:   . Frequency of Communication with Friends and Family:   . Frequency of Social Gatherings with Friends and Family:   . Attends Religious Services:   . Active Member of Clubs or Organizations:   . Attends Archivist Meetings:   Marland Kitchen Marital Status:     Activities of Daily Living In your present state of health, do you have any difficulty performing the following activities: 04/20/2020  Hearing? N  Vision? N  Difficulty concentrating or making decisions? N  Walking or climbing stairs? N  Dressing or bathing? N  Doing errands, shopping? N  Preparing Food and eating ?  N  Using the Toilet? N  In the past six months, have you accidently leaked urine? N  Do you have problems with loss of bowel control? N  Managing your Medications? N  Managing your Finances? N  Housekeeping or managing your Housekeeping? N  Some recent data might be hidden    Patient Education/ Literacy How often do you need to have someone help you when you read instructions, pamphlets, or other written materials from your doctor or pharmacy?: 1 - Never What is the last grade level you completed in school?: college  Exercise Current Exercise Habits: The patient does not participate in  regular exercise at present;Home exercise routine, Type of exercise: walking, Time (Minutes): 60, Frequency (Times/Week): 7, Weekly Exercise (Minutes/Week): 420, Intensity: Mild, Exercise limited by: None identified  Diet Patient reports consuming 2 meals a day and 2 snack(s) a day Patient reports that her primary diet is: Regular Patient reports that she does have regular access to food.   Depression Screen PHQ 2/9 Scores 04/20/2020 04/09/2019 03/16/2018 03/04/2018 12/18/2017 06/11/2017 12/19/2016  PHQ - 2 Score 0 0 0 0 0 0 0     Fall Risk Fall Risk  04/20/2020 04/09/2019 03/16/2018 03/04/2018 12/18/2017  Falls in the past year? 0 0 No No No     Objective:  Latarshia A Oshields seemed alert and oriented and she participated appropriately during our telephone visit.  Blood Pressure Weight BMI  BP Readings from Last 3 Encounters:  03/16/18 118/77  03/04/18 121/85  12/24/17 134/64   Wt Readings from Last 3 Encounters:  03/16/18 172 lb (78 kg)  03/04/18 173 lb (78.5 kg)  12/24/17 172 lb 6.4 oz (78.2 kg)   BMI Readings from Last 1 Encounters:  03/16/18 27.76 kg/m    *Unable to obtain current vital signs, weight, and BMI due to telephone visit type  Hearing/Vision  . Racheal did not seem to have difficulty with hearing/understanding during the telephone conversation . Reports that she has not had a  formal eye exam by an eye care professional within the past year . Reports that she has not had a formal hearing evaluation within the past year *Unable to fully assess hearing and vision during telephone visit type  Cognitive Function: 6CIT Screen 04/20/2020 04/09/2019  What Year? 0 points -  What month? 0 points -  What time? 0 points 0 points  Count back from 20 0 points 0 points  Months in reverse 0 points 0 points  Repeat phrase 0 points 2 points  Total Score 0 -   (Normal:0-7, Significant for Dysfunction: >8)  Normal Cognitive Function Screening: Yes   Immunization & Health Maintenance Record Immunization History  Administered Date(s) Administered  . Tdap 10/28/2013    Health Maintenance  Topic Date Due  . COVID-19 Vaccine (1) Never done  . COLONOSCOPY  02/25/2013  . DEXA SCAN  Never done  . PNA vac Low Risk Adult (1 of 2 - PCV13) Never done  . MAMMOGRAM  10/23/2018  . INFLUENZA VACCINE  05/28/2020  . TETANUS/TDAP  10/28/2024  . Hepatitis C Screening  Completed       Assessment  This is a routine wellness examination for Charae A Nicodemus.  Health Maintenance: Due or Overdue Health Maintenance Due  Topic Date Due  . COVID-19 Vaccine (1) Never done  . COLONOSCOPY  02/25/2013  . DEXA SCAN  Never done  . PNA vac Low Risk Adult (1 of 2 - PCV13) Never done  . MAMMOGRAM  10/23/2018    Darcella A Lockamy does not need a referral for Community Assistance: Care Management:   no Social Work:    no Prescription Assistance:  no Nutrition/Diabetes Education:  no   Plan:  Personalized Goals Goals Addressed              This Visit's Progress     Patient Stated   .  Patient Stated (pt-stated)   On track     " I want to get the kids moved into their own house and out of mine"      Personalized Health Maintenance & Screening Recommendations   Mammogram  scheduled today.  Xray notified to schedule DEXA scan  Patient states that she performed  Cologuard 3 months ago.   Lung Cancer Screening Recommended: no (Low Dose CT Chest recommended if Age 40-80 years, 30 pack-year currently smoking OR have quit w/in past 15 years) Hepatitis C Screening recommended: no HIV Screening recommended: no  Advanced Directives: Written information was not prepared per patient's request.  Referrals & Orders Orders Placed This Encounter  Procedures  . DG [facility] DEXA  . MM 3D SCREEN BREAST BILATERAL    Follow-up Plan . Follow-up with Janora Norlander, DO as planned . Patient did not want to schedule a follow up appointment at this time.    I have personally reviewed and noted the following in the patient's chart:   . Medical and social history . Use of alcohol, tobacco or illicit drugs  . Current medications and supplements . Functional ability and status . Nutritional status . Physical activity . Advanced directives . List of other physicians . Hospitalizations, surgeries, and ER visits in previous 12 months . Vitals . Screenings to include cognitive, depression, and falls . Referrals and appointments  In addition, I have reviewed and discussed with Taelar A Virrueta certain preventive protocols, quality metrics, and best practice recommendations. A written personalized care plan for preventive services as well as general preventive health recommendations is available and can be mailed to the patient at her request.      Maud Deed [facility]  07/22/9323

## 2020-04-26 ENCOUNTER — Telehealth: Payer: Self-pay | Admitting: Family Medicine

## 2020-04-26 DIAGNOSIS — W57XXXA Bitten or stung by nonvenomous insect and other nonvenomous arthropods, initial encounter: Secondary | ICD-10-CM

## 2020-04-26 NOTE — Telephone Encounter (Signed)
Patient was seen on 04/17/2020 for tick bites, prescribed Doxycycline bid x 7 days.

## 2020-04-27 ENCOUNTER — Telehealth: Payer: Self-pay | Admitting: Family Medicine

## 2020-04-27 MED ORDER — DOXYCYCLINE HYCLATE 100 MG PO TABS: ORAL_TABLET | ORAL | 0 refills | Status: DC

## 2020-04-27 NOTE — Telephone Encounter (Signed)
Patient aware ok to continue taking antibiotic and to let this office know if symptoms persist or worsen

## 2020-04-27 NOTE — Telephone Encounter (Signed)
7 day refill has been sent to pharmacy

## 2020-04-27 NOTE — Telephone Encounter (Signed)
Please renew her doxycycline for an additional 7 days.

## 2020-05-01 DIAGNOSIS — M9903 Segmental and somatic dysfunction of lumbar region: Secondary | ICD-10-CM | POA: Diagnosis not present

## 2020-05-01 DIAGNOSIS — M9902 Segmental and somatic dysfunction of thoracic region: Secondary | ICD-10-CM | POA: Diagnosis not present

## 2020-05-01 DIAGNOSIS — M9901 Segmental and somatic dysfunction of cervical region: Secondary | ICD-10-CM | POA: Diagnosis not present

## 2020-05-01 DIAGNOSIS — M62830 Muscle spasm of back: Secondary | ICD-10-CM | POA: Diagnosis not present

## 2020-05-03 DIAGNOSIS — M62830 Muscle spasm of back: Secondary | ICD-10-CM | POA: Diagnosis not present

## 2020-05-03 DIAGNOSIS — M9901 Segmental and somatic dysfunction of cervical region: Secondary | ICD-10-CM | POA: Diagnosis not present

## 2020-05-03 DIAGNOSIS — M9902 Segmental and somatic dysfunction of thoracic region: Secondary | ICD-10-CM | POA: Diagnosis not present

## 2020-05-03 DIAGNOSIS — M9903 Segmental and somatic dysfunction of lumbar region: Secondary | ICD-10-CM | POA: Diagnosis not present

## 2020-05-08 DIAGNOSIS — M9903 Segmental and somatic dysfunction of lumbar region: Secondary | ICD-10-CM | POA: Diagnosis not present

## 2020-05-08 DIAGNOSIS — M9902 Segmental and somatic dysfunction of thoracic region: Secondary | ICD-10-CM | POA: Diagnosis not present

## 2020-05-08 DIAGNOSIS — M9901 Segmental and somatic dysfunction of cervical region: Secondary | ICD-10-CM | POA: Diagnosis not present

## 2020-05-08 DIAGNOSIS — M62830 Muscle spasm of back: Secondary | ICD-10-CM | POA: Diagnosis not present

## 2020-05-10 DIAGNOSIS — M9902 Segmental and somatic dysfunction of thoracic region: Secondary | ICD-10-CM | POA: Diagnosis not present

## 2020-05-10 DIAGNOSIS — M62830 Muscle spasm of back: Secondary | ICD-10-CM | POA: Diagnosis not present

## 2020-05-10 DIAGNOSIS — M9901 Segmental and somatic dysfunction of cervical region: Secondary | ICD-10-CM | POA: Diagnosis not present

## 2020-05-10 DIAGNOSIS — M9903 Segmental and somatic dysfunction of lumbar region: Secondary | ICD-10-CM | POA: Diagnosis not present

## 2020-05-23 DIAGNOSIS — T7840XA Allergy, unspecified, initial encounter: Secondary | ICD-10-CM | POA: Diagnosis not present

## 2020-05-23 DIAGNOSIS — L509 Urticaria, unspecified: Secondary | ICD-10-CM | POA: Diagnosis not present

## 2020-05-23 DIAGNOSIS — L089 Local infection of the skin and subcutaneous tissue, unspecified: Secondary | ICD-10-CM | POA: Diagnosis not present

## 2020-06-04 DIAGNOSIS — M25522 Pain in left elbow: Secondary | ICD-10-CM | POA: Diagnosis not present

## 2020-06-04 DIAGNOSIS — Z03818 Encounter for observation for suspected exposure to other biological agents ruled out: Secondary | ICD-10-CM | POA: Diagnosis not present

## 2020-06-04 DIAGNOSIS — M79622 Pain in left upper arm: Secondary | ICD-10-CM | POA: Diagnosis not present

## 2020-06-04 DIAGNOSIS — Z1152 Encounter for screening for COVID-19: Secondary | ICD-10-CM | POA: Diagnosis not present

## 2020-06-19 ENCOUNTER — Telehealth: Payer: Self-pay | Admitting: Family Medicine

## 2020-06-20 DIAGNOSIS — M62830 Muscle spasm of back: Secondary | ICD-10-CM | POA: Diagnosis not present

## 2020-06-20 DIAGNOSIS — M9903 Segmental and somatic dysfunction of lumbar region: Secondary | ICD-10-CM | POA: Diagnosis not present

## 2020-06-20 DIAGNOSIS — M9902 Segmental and somatic dysfunction of thoracic region: Secondary | ICD-10-CM | POA: Diagnosis not present

## 2020-06-20 DIAGNOSIS — M9901 Segmental and somatic dysfunction of cervical region: Secondary | ICD-10-CM | POA: Diagnosis not present

## 2020-06-21 ENCOUNTER — Ambulatory Visit
Admission: RE | Admit: 2020-06-21 | Discharge: 2020-06-21 | Disposition: A | Discharge status: Home / Self Care | Payer: Medicare Other | Source: Ambulatory Visit | Attending: Family Medicine | Admitting: Family Medicine

## 2020-06-21 ENCOUNTER — Other Ambulatory Visit: Payer: Self-pay

## 2020-06-21 DIAGNOSIS — Z1231 Encounter for screening mammogram for malignant neoplasm of breast: Secondary | ICD-10-CM | POA: Diagnosis not present

## 2020-06-22 DIAGNOSIS — M9902 Segmental and somatic dysfunction of thoracic region: Secondary | ICD-10-CM | POA: Diagnosis not present

## 2020-06-22 DIAGNOSIS — M9903 Segmental and somatic dysfunction of lumbar region: Secondary | ICD-10-CM | POA: Diagnosis not present

## 2020-06-22 DIAGNOSIS — M9901 Segmental and somatic dysfunction of cervical region: Secondary | ICD-10-CM | POA: Diagnosis not present

## 2020-06-22 DIAGNOSIS — M62830 Muscle spasm of back: Secondary | ICD-10-CM | POA: Diagnosis not present

## 2020-06-26 ENCOUNTER — Ambulatory Visit (HOSPITAL_COMMUNITY): Payer: Medicare Other | Attending: Student | Admitting: Physical Therapy

## 2020-06-26 ENCOUNTER — Encounter (HOSPITAL_COMMUNITY): Payer: Self-pay | Admitting: Physical Therapy

## 2020-06-26 ENCOUNTER — Other Ambulatory Visit: Payer: Self-pay

## 2020-06-26 DIAGNOSIS — H8111 Benign paroxysmal vertigo, right ear: Secondary | ICD-10-CM | POA: Diagnosis not present

## 2020-06-26 DIAGNOSIS — R42 Dizziness and giddiness: Secondary | ICD-10-CM
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
## 2020-06-28 ENCOUNTER — Telehealth (HOSPITAL_COMMUNITY): Payer: Self-pay | Admitting: Physical Therapy

## 2020-06-28 DIAGNOSIS — M62830 Muscle spasm of back: Secondary | ICD-10-CM | POA: Diagnosis not present

## 2020-06-28 DIAGNOSIS — M9901 Segmental and somatic dysfunction of cervical region: Secondary | ICD-10-CM | POA: Diagnosis not present

## 2020-06-28 DIAGNOSIS — M9903 Segmental and somatic dysfunction of lumbar region: Secondary | ICD-10-CM | POA: Diagnosis not present

## 2020-06-28 DIAGNOSIS — M9902 Segmental and somatic dysfunction of thoracic region: Secondary | ICD-10-CM | POA: Diagnosis not present

## 2020-06-28 NOTE — Telephone Encounter (Signed)
Returned patient phone call, and addressed pending questions. Plan to f/u at next appointment scheduled 07/05/20.  5:46 PM, 06/28/20 Josue Hector PT DPT  Physical Therapist with [facility]  [phone]

## 2020-07-05 ENCOUNTER — Telehealth (HOSPITAL_COMMUNITY): Payer: Self-pay | Admitting: Physical Therapy

## 2020-07-05 ENCOUNTER — Ambulatory Visit (HOSPITAL_COMMUNITY): Payer: Medicare Other | Admitting: Physical Therapy

## 2020-07-05 DIAGNOSIS — M62830 Muscle spasm of back: Secondary | ICD-10-CM | POA: Diagnosis not present

## 2020-07-05 DIAGNOSIS — M9902 Segmental and somatic dysfunction of thoracic region: Secondary | ICD-10-CM | POA: Diagnosis not present

## 2020-07-05 DIAGNOSIS — M9903 Segmental and somatic dysfunction of lumbar region: Secondary | ICD-10-CM | POA: Diagnosis not present

## 2020-07-05 DIAGNOSIS — M9901 Segmental and somatic dysfunction of cervical region: Secondary | ICD-10-CM | POA: Diagnosis not present

## 2020-07-05 NOTE — Telephone Encounter (Signed)
Patient l/m and I sw/ her she is doing great and wishes to be discharged.

## 2020-07-10 DIAGNOSIS — M9902 Segmental and somatic dysfunction of thoracic region: Secondary | ICD-10-CM | POA: Diagnosis not present

## 2020-07-10 DIAGNOSIS — M9901 Segmental and somatic dysfunction of cervical region: Secondary | ICD-10-CM | POA: Diagnosis not present

## 2020-07-10 DIAGNOSIS — M62830 Muscle spasm of back: Secondary | ICD-10-CM | POA: Diagnosis not present

## 2020-07-10 DIAGNOSIS — M9903 Segmental and somatic dysfunction of lumbar region: Secondary | ICD-10-CM | POA: Diagnosis not present

## 2020-07-12 DIAGNOSIS — M9902 Segmental and somatic dysfunction of thoracic region: Secondary | ICD-10-CM | POA: Diagnosis not present

## 2020-07-12 DIAGNOSIS — M9903 Segmental and somatic dysfunction of lumbar region: Secondary | ICD-10-CM | POA: Diagnosis not present

## 2020-07-12 DIAGNOSIS — M9901 Segmental and somatic dysfunction of cervical region: Secondary | ICD-10-CM | POA: Diagnosis not present

## 2020-07-12 DIAGNOSIS — M62830 Muscle spasm of back: Secondary | ICD-10-CM | POA: Diagnosis not present

## 2020-07-16 DIAGNOSIS — M545 Low back pain: Secondary | ICD-10-CM | POA: Diagnosis not present

## 2020-07-16 DIAGNOSIS — L02415 Cutaneous abscess of right lower limb: Secondary | ICD-10-CM | POA: Diagnosis not present

## 2020-07-17 DIAGNOSIS — M9902 Segmental and somatic dysfunction of thoracic region: Secondary | ICD-10-CM | POA: Diagnosis not present

## 2020-07-17 DIAGNOSIS — M62830 Muscle spasm of back: Secondary | ICD-10-CM | POA: Diagnosis not present

## 2020-07-17 DIAGNOSIS — M9901 Segmental and somatic dysfunction of cervical region: Secondary | ICD-10-CM | POA: Diagnosis not present

## 2020-07-17 DIAGNOSIS — M9903 Segmental and somatic dysfunction of lumbar region: Secondary | ICD-10-CM | POA: Diagnosis not present

## 2020-07-26 ENCOUNTER — Ambulatory Visit (INDEPENDENT_AMBULATORY_CARE_PROVIDER_SITE_OTHER): Payer: Medicare Other

## 2020-07-26 ENCOUNTER — Encounter: Payer: Self-pay | Admitting: Family Medicine

## 2020-07-26 ENCOUNTER — Other Ambulatory Visit: Payer: Self-pay

## 2020-07-26 DIAGNOSIS — Z780 Asymptomatic menopausal state: Secondary | ICD-10-CM

## 2020-08-02 ENCOUNTER — Telehealth: Payer: Self-pay

## 2020-08-08 ENCOUNTER — Ambulatory Visit (INDEPENDENT_AMBULATORY_CARE_PROVIDER_SITE_OTHER): Payer: Medicare Other | Admitting: Family Medicine

## 2020-08-08 ENCOUNTER — Ambulatory Visit (INDEPENDENT_AMBULATORY_CARE_PROVIDER_SITE_OTHER): Payer: Medicare Other

## 2020-08-08 ENCOUNTER — Other Ambulatory Visit: Payer: Self-pay

## 2020-08-08 ENCOUNTER — Encounter: Payer: Self-pay | Admitting: Family Medicine

## 2020-08-08 VITALS — BP 110/61 | HR 76 | Temp 97.7°F | Resp 20 | Ht 66.0 in | Wt 181.0 lb

## 2020-08-08 DIAGNOSIS — M25511 Pain in right shoulder: Secondary | ICD-10-CM

## 2020-08-08 DIAGNOSIS — M25512 Pain in left shoulder: Secondary | ICD-10-CM | POA: Diagnosis not present

## 2020-08-08 DIAGNOSIS — G8929 Other chronic pain: Secondary | ICD-10-CM

## 2020-08-08 DIAGNOSIS — M542 Cervicalgia: Secondary | ICD-10-CM | POA: Diagnosis not present

## 2020-08-08 DIAGNOSIS — M545 Low back pain, unspecified: Secondary | ICD-10-CM

## 2020-08-08 MED ORDER — PREDNISONE 20 MG PO TABS: ORAL_TABLET | ORAL | 0 refills | Status: DC

## 2020-08-08 NOTE — Patient Instructions (Signed)
Rotator Cuff Tear  A rotator cuff tear is a partial or complete tear of the cord-like bands (tendons) that connect muscle to bone in the rotator cuff. The rotator cuff is a group of muscles and tendons that surround the shoulder joint and keep the upper arm bone (humerus) in the shoulder socket. The tear can occur suddenly (acute tear) or can develop over a long period of time (chronic tear). What are the causes? Acute tears may be caused by:  A fall, especially on an outstretched arm.  Lifting very heavy objects with a jerking motion. Chronic tears may be caused by overuse of the muscles. This may happen in sports, physical work, or activities in which your arm repeatedly moves over your head. What increases the risk? This condition is more likely to occur in:  Athletes and workers who frequently use their shoulder or reach over their heads. This may include activities such as: ? Tennis. ? Baseball and softball. ? Swimming and rowing. ? Weightlifting. ? Construction work. ? Painting.  People who smoke.  Older people who have arthritis or poor blood supply. These can make the muscles and tendons weaker. What are the signs or symptoms? Symptoms of this condition depend on the type and severity of the injury:  An acute tear may include a sudden tearing feeling, followed by severe pain that goes from your upper shoulder, down your arm, and toward your elbow.  A chronic tear includes a gradual weakness and decreased shoulder motion as the pain gets worse. The pain is usually worse at night. Both types may have symptoms such as:  Pain that spreads (radiates) from the shoulder to the upper arm.  Swelling and tenderness in front of the shoulder.  Decreased range of motion.  Pain when: ? Reaching, pulling, or lifting the arm above the head. ? Lowering the arm from above the head.  Not being able to raise your arm out to the side.  Difficulty placing the arm behind your back. How  is this diagnosed? This condition is diagnosed with a medical history and physical exam. Imaging tests may also be done, including:  X-rays.  MRI.  Ultrasound.  CT or MR arthrogram. During this test, a contrast material is injected into your shoulder and then images are taken. How is this treated? Treatment for this condition depends on the type and severity of the condition. In less severe cases, treatment may include:  Rest. This may be done with a sling that holds the shoulder still (immobilization). Your health care provider may also recommend avoiding activities that involve lifting your arm over your head.  Icing the shoulder.  Anti-inflammatory medicines, such as aspirin or ibuprofen.  Strengthening and stretching exercises. Your health care provider may recommend specific exercises to improve your range of motion and strengthen your shoulder. In more severe cases, treatment may include:  Physical therapy.  Steroid injections.  Surgery. Follow these instructions at home: Managing pain, stiffness, and swelling  If directed, put ice on the injured area. ? If you have a removable sling, remove it as told by your health care provider. ? Put ice in a plastic bag. ? Place a towel between your skin and the bag. ? Leave the ice on for 20 minutes, 2-3 times a day.  Raise (elevate) the injured area above the level of your heart while you are lying down.  Find a comfortable sleeping position or sleep on a recliner, if available.  Move your fingers often to avoid stiffness   and to lessen swelling. °· Once the swelling has gone down, your health care provider may direct you to apply heat to relax the muscles. Use the heat source that your health care provider recommends, such as a moist heat pack or a heating pad. °? Place a towel between your skin and the heat source. °? Leave the heat on for 20-30 minutes. °? Remove the heat if your skin turns bright red. This is especially  important if you are unable to feel pain, heat, or cold. You may have a greater risk of getting burned. °If you have a sling: °· Wear the sling as told by your health care provider. Remove it only as told by your health care provider. °· Loosen the sling if your fingers tingle, become numb, or turn cold and blue. °· Keep the sling clean. °· If the sling is not waterproof: °? Do not let it get wet. °? Cover it with a watertight covering when you take a bath or a shower. °Driving °· Do not drive or use heavy machinery while taking prescription pain medicine. °· Ask your health care provider when it is safe to drive if you have a sling on your arm. °Activity °· Rest your shoulder as told by your health care provider. °· Return to your normal activities as told by your health care provider. Ask your health care provider what activities are safe for you. °· Do any exercises or stretches as told by your health care provider. °General instructions °· Do not use any products that contain nicotine or tobacco, such as cigarettes and e-cigarettes. If you need help quitting, ask your health care provider. °· Take over-the-counter and prescription medicines only as told by your health care provider. °· Keep all follow-up visits as told by your health care provider. This is important. °Contact a health care provider if: °· Your pain gets worse. °· You have new pain in your arm, hands, or fingers. °· Medicine does not help your pain. °Get help right away if: °· Your arm, hand, or fingers are numb or tingling. °· Your arm, hand, or fingers are swollen or painful or they turn white or blue. °· Your hand or fingers on your injured arm are colder than your other hand. °Summary °· A rotator cuff tear is a partial or complete tear of the cord-like bands (tendons) that connect muscle to bone in the rotator cuff. °· The tear can occur suddenly (acute tear) or can develop over a long period of time (chronic tear). °· Treatment generally  includes rest, anti-inflammatory medicines, and icing. In some cases, physical therapy and steroid injections may be needed. In severe cases, surgery may be needed. °This information is not intended to replace advice given to you by your health care provider. Make sure you discuss any questions you have with your health care provider. °Document Revised: 09/26/2017 Document Reviewed: 12/30/2016 °Elsevier Patient Education © 2020 [employer] ° °

## 2020-08-08 NOTE — Progress Notes (Signed)
Subjective: CC: chronic shoulder pain, back pain PCP: Janora Norlander, DO  [id number] Jenna Smith is Jenna 69 y.o. female presenting to clinic today for:  1. Chronic shoulder pain Jenna Smith reports chronic shoulder pain that started 4-5 years ago. 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 She has difficulty describing the pain. The pain is across her upper back to bilateral arms down to elbows. The pain waxes and wanes. Pain 10/10 at times. It wakes her up at night sometimes. She also reports some pain in her neck from the base of her neck that radiates up. She has also noticed decreased ability to raise her arms. She has tried tylenol and muscle rub without relief. She tried Jenna tramadol of her husbands with some short relief, but she prefers not to take pain medication. She denies dizziness, chest pain, shortness or breath, changes in vision, or headache. Denies recent trauma or injury.   2. Back pain She reports right side lower back pain that starts at the center and radiates to her right side. Pain is Jenna constant pain that she has difficulty describing. The pain is Jenna 10/10 at time. She wears Jenna back brace that does help. She denies changes in bowel or bladder control, urinary symptoms, abdominal pain, or fever. Denies pain or numbness/tingling in legs. Denies recent trauma or injury.   Relevant past medical, surgical, family, and social history reviewed and updated as indicated.  Allergies and medications reviewed and updated.  Allergies  Allergen Reactions  . Prednisone     Pt states she thinks this was due to her taking prednisone with her pain medications-has taken prednisone since and didn't have any problems  . Indomethacin Swelling    Face swells up  . Bee Venom   . Latex Hives  . Neosporin [Neomycin-Bacitracin Zn-Polymyx] Hives  . Penicillins Hives    Has patient had Jenna PCN reaction  causing immediate rash, facial/tongue/throat swelling, SOB or lightheadedness with hypotension: no Has patient had Jenna PCN reaction causing severe rash involving mucus membranes or skin necrosis: no Has patient had Jenna PCN reaction that required hospitalization: no Has patient had Jenna PCN reaction occurring within the last 10 years: no If all of the above answers are "NO", then may proceed with Cephalosporin use.    Past Medical History:  Diagnosis Date  . Allergy   . Anemia 12/22/2017   No current outpatient medications on file. Social History   Socioeconomic History  . Marital status: Married    Spouse name: Richardson Landry  . Number of children: 1  . Years of education: 19  . Highest education level: Some college, no degree  Occupational History  . Occupation: retired  Tobacco Use  . Smoking status: Former Smoker    Packs/day: 0.30    Years: 15.00    Pack years: 4.50    Types: Cigarettes    Quit date: 10/28/1984    Years since quitting: 35.8  . Smokeless tobacco: Never Used  . Tobacco comment: smoked "off and on" x 15 yrs  Vaping Use  . Vaping Use: Never used  Substance and Sexual Activity  . Alcohol use: No    Alcohol/week: 0.0 standard drinks    Comment: Holidays - occasional wine  . Drug use: No  . Sexual activity: Yes    Birth control/protection: Post-menopausal  Other Topics Concern  . Not on file  Social History Narrative  . Not on file   Social Determinants of Health   Financial Resource Strain:   . Difficulty of Paying Living Expenses: Not on file  Food Insecurity:   . Worried About Charity fundraiser in the Last Year: Not on file  . Ran Out of Food in the Last Year: Not on file  Transportation Needs:   . Lack of Transportation (Medical): Not on file  . Lack of Transportation (Non-Medical): Not on file  Physical Activity:   . Days of Exercise per Week: Not on file  . Minutes of Exercise per Session: Not on file  Stress:   . Feeling of Stress : Not on file    Social Connections:   . Frequency of Communication with Friends and Family: Not on file  . Frequency of Social Gatherings with Friends and Family: Not on file  . Attends Religious Services: Not on file  . Active Member of Clubs or Organizations: Not on file  . Attends Archivist Meetings: Not on file  . Marital Status: Not on file  Intimate Partner Violence:   . Fear of Current or Ex-Partner: Not on file  . Emotionally Abused: Not on file  . Physically Abused: Not on file  . Sexually Abused: Not on file   Family History  Problem Relation Age of Onset  . Aneurysm Mother        passed away at age 28  . Cancer Father        passed away after Prostate cancer metastesize  . Cancer Sister        she passed away 41 due to Breast Cancer  . Stroke Sister   . Aneurysm Sister     Review of Systems  Per HPI.   Objective: Office vital signs reviewed. BP 110/61   Pulse 76   Temp 97.7 F (36.5 C)   Resp 20   Ht 5\' 6"  (1.676 m)   Wt 181 lb (82.1 kg)   SpO2 97%   BMI 29.21 kg/m   Physical Examination:  Physical Exam Vitals and nursing note reviewed.  Constitutional:      Appearance: Normal appearance.  HENT:     Head: Normocephalic and atraumatic.  Neck:     Comments: Pain with right and left lateral flexion.  Cardiovascular:     Rate and Rhythm: Normal rate and regular rhythm.     Heart sounds: Normal heart sounds. No murmur heard.   Pulmonary:     Effort: Pulmonary effort is normal.     Breath sounds: Normal breath sounds.  Musculoskeletal:     Right shoulder: No swelling, deformity, tenderness or bony tenderness. Decreased range of motion. Decreased strength.     Left shoulder: No swelling, deformity, tenderness or bony tenderness. Decreased range of motion. Decreased strength.     Right upper arm: Normal.     Left upper arm: Normal.     Cervical back: Normal range of motion and neck supple. No rigidity or tenderness.     Lumbar back: Normal. Negative  right straight leg raise test and negative left straight leg raise test.     Right lower leg: No edema.     Left lower leg: No edema.     Comments: + empty can test bilateral  Lymphadenopathy:     Cervical: No cervical adenopathy.  Skin:    General: Skin is warm and dry.  Neurological:     General: No focal deficit present.  Mental Status: She is alert and oriented to person, place, and time.  Psychiatric:        Mood and Affect: Mood normal.        Behavior: Behavior normal.      Results for orders placed or performed during the hospital encounter of 12/22/17  MRSA PCR Screening   Specimen: Nasal Mucosa; Nasopharyngeal  Result Value Ref Range   MRSA by PCR NEGATIVE NEGATIVE  Culture, Urine   Specimen: Urine, Clean Catch  Result Value Ref Range   Specimen Description      URINE, CLEAN CATCH Performed at [facility], [address], [place], [place] [address]    Special Requests      NONE Performed at [facility], [address], [place], [place] [address]    Culture (Jenna)     <10,000 COLONIES/mL Performed at [facility] [address], [place], [place] [address]    Report Status 12/24/2017 FINAL   Comprehensive metabolic panel  Result Value Ref Range   Sodium 137 135 - 145 mmol/L   Potassium 3.3 (L) 3.5 - 5.1 mmol/L   Chloride 100 (L) 101 - 111 mmol/L   CO2 27 22 - 32 mmol/L   Glucose, Bld 101 (H) 65 - 99 mg/dL   BUN 25 (H) 6 - 20 mg/dL   Creatinine, Ser 0.77 0.44 - 1.00 mg/dL   Calcium 9.1 8.9 - 10.3 mg/dL   Total Protein 6.9 6.5 - 8.1 g/dL   Albumin 3.5 3.5 - 5.0 g/dL   AST 14 (L) 15 - 41 U/L   ALT 13 (L) 14 - 54 U/L   Alkaline Phosphatase 114 38 - 126 U/L   Total Bilirubin 0.2 (L) 0.3 - 1.2 mg/dL   GFR calc non Af Amer >60 >60 mL/min   GFR calc Af Amer >60 >60 mL/min   Anion gap 10 5 - 15  Lipase, blood  Result Value Ref Range   Lipase 19 11 - 51 U/L  CBC with Differential/Platelet  Result Value Ref Range   WBC 12.2 (H) 4.0 - 10.5 K/uL   RBC  4.16 3.87 - 5.11 MIL/uL   Hemoglobin 11.4 (L) 12.0 - 15.0 g/dL   HCT 36.0 36 - 46 %   MCV 86.5 78.0 - 100.0 fL   MCH 27.4 26.0 - 34.0 pg   MCHC 31.7 30.0 - 36.0 g/dL   RDW 13.6 11.5 - 15.5 %   Platelets 274 150 - 400 K/uL   Neutrophils Relative % 80 %   Lymphocytes Relative 11 %   Monocytes Relative 9 %   Eosinophils Relative 0 %   Basophils Relative 0 %   Neutro Abs 9.8 (H) 1.7 - 7.7 K/uL   Lymphs Abs 1.3 0.7 - 4.0 K/uL   Monocytes Absolute 1.1 (H) 0.1 - 1.0 K/uL   Eosinophils Absolute 0.0 0.0 - 0.7 K/uL   Basophils Absolute 0.0 0.0 - 0.1 K/uL   RBC Morphology FEW BANDS   Ethanol  Result Value Ref Range   Alcohol, Ethyl (B) <10 <10 mg/dL  Rapid urine drug screen (hospital performed)  Result Value Ref Range   Opiates POSITIVE (Jenna) NONE DETECTED   Cocaine NONE DETECTED NONE DETECTED   Benzodiazepines NONE DETECTED NONE DETECTED   Amphetamines NONE DETECTED NONE DETECTED   Tetrahydrocannabinol NONE DETECTED NONE DETECTED   Barbiturates NONE DETECTED NONE DETECTED  Urinalysis, Routine w reflex microscopic  Result Value Ref Range   Color, Urine YELLOW YELLOW   APPearance  CLOUDY (Jenna) CLEAR   Specific Gravity, Urine 1.029 1.005 - 1.030   pH 5.0 5.0 - 8.0   Glucose, UA NEGATIVE NEGATIVE mg/dL   Hgb urine dipstick MODERATE (Jenna) NEGATIVE   Bilirubin Urine SMALL (Jenna) NEGATIVE   Ketones, ur NEGATIVE NEGATIVE mg/dL   Protein, ur 100 (Jenna) NEGATIVE mg/dL   Nitrite NEGATIVE NEGATIVE   Leukocytes, UA NEGATIVE NEGATIVE   RBC / HPF 6-30 0 - 5 RBC/hpf   WBC, UA 6-30 0 - 5 WBC/hpf   Bacteria, UA RARE (Jenna) NONE SEEN   Squamous Epithelial / LPF 0-5 (Jenna) NONE SEEN   Mucus PRESENT    Ca Oxalate Crys, UA PRESENT   Magnesium  Result Value Ref Range   Magnesium 2.1 1.7 - 2.4 mg/dL  HIV antibody (Routine Testing)  Result Value Ref Range   HIV Screen 4th Generation wRfx Non Reactive Non Reactive  Comprehensive metabolic panel  Result Value Ref Range   Sodium 140 135 - 145 mmol/L   Potassium 3.8  3.5 - 5.1 mmol/L   Chloride 104 101 - 111 mmol/L   CO2 27 22 - 32 mmol/L   Glucose, Bld 85 65 - 99 mg/dL   BUN 15 6 - 20 mg/dL   Creatinine, Ser 0.82 0.44 - 1.00 mg/dL   Calcium 8.9 8.9 - 10.3 mg/dL   Total Protein 6.3 (L) 6.5 - 8.1 g/dL   Albumin 3.2 (L) 3.5 - 5.0 g/dL   AST 12 (L) 15 - 41 U/L   ALT 13 (L) 14 - 54 U/L   Alkaline Phosphatase 115 38 - 126 U/L   Total Bilirubin 0.2 (L) 0.3 - 1.2 mg/dL   GFR calc non Af Amer >60 >60 mL/min   GFR calc Af Amer >60 >60 mL/min   Anion gap 9 5 - 15  CBC  Result Value Ref Range   WBC 7.9 4.0 - 10.5 K/uL   RBC 4.00 3.87 - 5.11 MIL/uL   Hemoglobin 11.0 (L) 12.0 - 15.0 g/dL   HCT 35.0 (L) 36 - 46 %   MCV 87.5 78.0 - 100.0 fL   MCH 27.5 26.0 - 34.0 pg   MCHC 31.4 30.0 - 36.0 g/dL   RDW 14.0 11.5 - 15.5 %   Platelets 271 150 - 400 K/uL  Troponin I (q 6hr x 3)  Result Value Ref Range   Troponin I 0.08 (HH) <0.03 ng/mL  Troponin I (q 6hr x 3)  Result Value Ref Range   Troponin I 0.05 (HH) <0.03 ng/mL  Troponin I (q 6hr x 3)  Result Value Ref Range   Troponin I 0.04 (HH) <0.03 ng/mL  Potassium  Result Value Ref Range   Potassium 3.6 3.5 - 5.1 mmol/L  ECHOCARDIOGRAM COMPLETE  Result Value Ref Range   Weight 2,747.81 oz   Height 66 in   BP 122/76 mmHg   LV PW d 9.48 (Jenna) 0.6 - 1.1 mm   FS 42 28 - 44 %   LA vol 51.9 mL   LA ID, Jenna-P, ES 33 mm   IVS/LV PW RATIO, ED .9    Stroke v 38 ml   LVOT VTI 32.5 cm   Reg peak vel 222 cm/s   RV sys press 28 mmHg   LV e' LATERAL 9.68 cm/s   LV E/e' medial 10.74    LV E/e'average 10.74    LA diam index 1.72 cm/m2   LA vol A4C 52.8 ml   LVOT peak grad rest 9 mmHg  E decel time 232 msec   LVOT diameter 17 mm   LVOT area 2.27 cm2   LVOT peak vel 148 cm/s   LVOT SV 74.00 mL   MV Peak grad 4 mmHg   E/e' ratio 10.74    MV pk E vel 104 m/s   TR max vel 222 cm/s   MV pk Jenna vel 85.7 m/s   LV sys vol 18 14 - 42 mL   LV sys vol index 9.0 mL/m2   LV dias vol 56 46 - 106 mL   LV dias vol index  29.0 mL/m2   LA vol index 27.0 mL/m2   MV Dec 232    LA diam end sys 33.00 mm   Simpson's disk 68.00    TDI e' medial 9.46    TDI e' lateral 9.68    Lateral S' vel 12.10 cm/sec   TAPSE 25.00 mm     Assessment/ Plan: Jenna Smith was seen today for bilateral shoulder and arm pain.  Diagnoses and all orders for this visit:  Acute right sided low back pain without siatica No acute fractures noted, alignment appropriate on xray today. Radiology report pending. Likely muscular pathology. Patient has recently tolerated oral prednisone. Motrin OTC as she has also tolerated this previously. Follow up for new or worsening symptoms, or if symptoms persist.  -     DG Lumbar Spine 2-3 Views; Future -     predniSONE (DELTASONE) 20 MG tablet; 2 po at sametime daily for 5 days  Chronic pain of both shoulders Concern for rotator cuff pathology based on exam. Patient has been seen by Dr. Gladstone Lighter at [facility] for other orthopedic needs and would like Jenna referral to see him.  -     predniSONE (DELTASONE) 20 MG tablet; 2 po at sametime daily for 5 days -     Ambulatory referral to Orthopedic Surgery  Neck discomfort No alarm signs today. Radiology report pending. ? Muscular vs nerve pathology.  Prednisone and Motrin. Follow up for new or worsening symptoms, or if symptoms persist.  -     DG Cervical Spine Complete; Future -     predniSONE (DELTASONE) 20 MG tablet; 2 po at sametime daily for 5 days  Follow up as needed.   The above assessment and management plan was discussed with the patient. The patient verbalized understanding of and has agreed to the management plan. Patient is aware to call the clinic if symptoms persist or worsen. Patient is aware when to return to the clinic for Jenna follow-up visit. Patient educated on when it is appropriate to go to the emergency department.   Jenna Smolder, FNP-C [facility] [address] [place], [place] [address] [phone]

## 2020-08-10 ENCOUNTER — Other Ambulatory Visit: Payer: Self-pay | Admitting: *Deleted

## 2020-08-10 ENCOUNTER — Other Ambulatory Visit: Payer: Medicare Other

## 2020-08-10 ENCOUNTER — Other Ambulatory Visit: Payer: Self-pay

## 2020-08-10 DIAGNOSIS — E669 Obesity, unspecified: Secondary | ICD-10-CM

## 2020-08-10 DIAGNOSIS — E876 Hypokalemia: Secondary | ICD-10-CM | POA: Diagnosis not present

## 2020-08-10 DIAGNOSIS — D649 Anemia, unspecified: Secondary | ICD-10-CM

## 2020-08-11 ENCOUNTER — Telehealth: Payer: Self-pay

## 2020-08-11 LAB — CMP14+EGFR
ALT: 15 IU/L (ref 0–32)
AST: 14 IU/L (ref 0–40)
Albumin/Globulin Ratio: 1.6 (ref 1.2–2.2)
Albumin: 4.2 g/dL (ref 3.8–4.8)
Alkaline Phosphatase: 101 IU/L (ref 44–121)
BUN/Creatinine Ratio: 25 (ref 12–28)
BUN: 22 mg/dL (ref 8–27)
Bilirubin Total: <0.2 mg/dL (ref 0.0–1.2)
CO2: 28 mmol/L (ref 20–29)
Calcium: 9.5 mg/dL (ref 8.7–10.3)
Chloride: 104 mmol/L (ref 96–106)
Creatinine, Ser: 0.89 mg/dL (ref 0.57–1.00)
GFR calc Af Amer: 78 mL/min/{1.73_m2} (ref >59)
GFR calc non Af Amer: 67 mL/min/{1.73_m2} (ref >59)
Globulin, Total: 2.6 g/dL (ref 1.5–4.5)
Glucose: 85 mg/dL (ref 65–99)
Potassium: 4.4 mmol/L (ref 3.5–5.2)
Sodium: 143 mmol/L (ref 134–144)
Total Protein: 6.8 g/dL (ref 6.0–8.5)

## 2020-08-11 LAB — CBC WITH DIFFERENTIAL/PLATELET
Basophils Absolute: 0 10*3/uL (ref 0.0–0.2)
Basos: 0 %
EOS (ABSOLUTE): 0 10*3/uL (ref 0.0–0.4)
Eos: 0 %
Hematocrit: 39 % (ref 34.0–46.6)
Hemoglobin: 13.3 g/dL (ref 11.1–15.9)
Immature Grans (Abs): 0.1 10*3/uL (ref 0.0–0.1)
Immature Granulocytes: 1 %
Lymphocytes Absolute: 2.4 10*3/uL (ref 0.7–3.1)
Lymphs: 20 %
MCH: 30.4 pg (ref 26.6–33.0)
MCHC: 34.1 g/dL (ref 31.5–35.7)
MCV: 89 fL (ref 79–97)
Monocytes Absolute: 0.8 10*3/uL (ref 0.1–0.9)
Monocytes: 7 %
Neutrophils Absolute: 8.4 10*3/uL — ABNORMAL HIGH (ref 1.4–7.0)
Neutrophils: 72 %
Platelets: 247 10*3/uL (ref 150–450)
RBC: 4.38 x10E6/uL (ref 3.77–5.28)
RDW: 13.9 % (ref 11.7–15.4)
WBC: 11.7 10*3/uL — ABNORMAL HIGH (ref 3.4–10.8)

## 2020-08-11 LAB — LIPID PANEL
Chol/HDL Ratio: 3.1 ratio (ref 0.0–4.4)
Cholesterol, Total: 255 mg/dL — ABNORMAL HIGH (ref 100–199)
HDL: 83 mg/dL (ref >39)
LDL Chol Calc (NIH): 152 mg/dL — ABNORMAL HIGH (ref 0–99)
Triglycerides: 114 mg/dL (ref 0–149)
VLDL Cholesterol Cal: 20 mg/dL (ref 5–40)

## 2020-08-11 NOTE — Telephone Encounter (Signed)
Patient aware we will call her once her labs are reviewed by the provider.

## 2020-08-18 ENCOUNTER — Telehealth: Payer: Self-pay

## 2020-08-18 NOTE — Telephone Encounter (Signed)
Pt called stating that she has been waiting to hear from someone regarding her referral to see Dr Gladstone Lighter. Wants someone to call her with more info when available.

## 2020-08-18 NOTE — Telephone Encounter (Signed)
Pt called to get lab results. Reviewed results with pt per Dr Alver Sorrow note. Pt voiced understanding. Said there is no family history of cholesterol issues in her family but says she wasn't fasting when blood work was taken so when she comes back in to have it rechecked she will be fasting.

## 2020-08-24 DIAGNOSIS — M545 Low back pain, unspecified: Secondary | ICD-10-CM | POA: Diagnosis not present

## 2020-09-11 DIAGNOSIS — L03011 Cellulitis of right finger: Secondary | ICD-10-CM | POA: Diagnosis not present

## 2020-09-19 DIAGNOSIS — M9903 Segmental and somatic dysfunction of lumbar region: Secondary | ICD-10-CM | POA: Diagnosis not present

## 2020-09-19 DIAGNOSIS — M9902 Segmental and somatic dysfunction of thoracic region: Secondary | ICD-10-CM | POA: Diagnosis not present

## 2020-09-19 DIAGNOSIS — M62830 Muscle spasm of back: Secondary | ICD-10-CM | POA: Diagnosis not present

## 2020-09-19 DIAGNOSIS — M9901 Segmental and somatic dysfunction of cervical region: Secondary | ICD-10-CM | POA: Diagnosis not present

## 2020-09-20 DIAGNOSIS — M9903 Segmental and somatic dysfunction of lumbar region: Secondary | ICD-10-CM | POA: Diagnosis not present

## 2020-09-20 DIAGNOSIS — M62830 Muscle spasm of back: Secondary | ICD-10-CM | POA: Diagnosis not present

## 2020-09-20 DIAGNOSIS — M9901 Segmental and somatic dysfunction of cervical region: Secondary | ICD-10-CM | POA: Diagnosis not present

## 2020-09-20 DIAGNOSIS — M9902 Segmental and somatic dysfunction of thoracic region: Secondary | ICD-10-CM | POA: Diagnosis not present

## 2020-09-25 DIAGNOSIS — M9901 Segmental and somatic dysfunction of cervical region: Secondary | ICD-10-CM | POA: Diagnosis not present

## 2020-09-25 DIAGNOSIS — M9903 Segmental and somatic dysfunction of lumbar region: Secondary | ICD-10-CM | POA: Diagnosis not present

## 2020-09-25 DIAGNOSIS — M62830 Muscle spasm of back: Secondary | ICD-10-CM | POA: Diagnosis not present

## 2020-09-25 DIAGNOSIS — M9902 Segmental and somatic dysfunction of thoracic region: Secondary | ICD-10-CM | POA: Diagnosis not present

## 2020-09-27 DIAGNOSIS — M62830 Muscle spasm of back: Secondary | ICD-10-CM | POA: Diagnosis not present

## 2020-09-27 DIAGNOSIS — M9902 Segmental and somatic dysfunction of thoracic region: Secondary | ICD-10-CM | POA: Diagnosis not present

## 2020-09-27 DIAGNOSIS — M9903 Segmental and somatic dysfunction of lumbar region: Secondary | ICD-10-CM | POA: Diagnosis not present

## 2020-09-27 DIAGNOSIS — M9901 Segmental and somatic dysfunction of cervical region: Secondary | ICD-10-CM | POA: Diagnosis not present

## 2020-10-02 DIAGNOSIS — M9903 Segmental and somatic dysfunction of lumbar region: Secondary | ICD-10-CM | POA: Diagnosis not present

## 2020-10-02 DIAGNOSIS — M9901 Segmental and somatic dysfunction of cervical region: Secondary | ICD-10-CM | POA: Diagnosis not present

## 2020-10-02 DIAGNOSIS — M62830 Muscle spasm of back: Secondary | ICD-10-CM | POA: Diagnosis not present

## 2020-10-02 DIAGNOSIS — M9902 Segmental and somatic dysfunction of thoracic region: Secondary | ICD-10-CM | POA: Diagnosis not present

## 2020-10-04 ENCOUNTER — Other Ambulatory Visit: Payer: Self-pay

## 2020-10-04 ENCOUNTER — Ambulatory Visit (INDEPENDENT_AMBULATORY_CARE_PROVIDER_SITE_OTHER): Payer: Medicare Other | Admitting: Family Medicine

## 2020-10-04 ENCOUNTER — Encounter: Payer: Self-pay | Admitting: Family Medicine

## 2020-10-04 VITALS — BP 107/71 | HR 77 | Temp 98.0°F | Ht 66.0 in | Wt 185.0 lb

## 2020-10-04 DIAGNOSIS — R635 Abnormal weight gain: Secondary | ICD-10-CM | POA: Diagnosis not present

## 2020-10-04 DIAGNOSIS — Z136 Encounter for screening for cardiovascular disorders: Secondary | ICD-10-CM | POA: Diagnosis not present

## 2020-10-04 DIAGNOSIS — R42 Dizziness and giddiness: Secondary | ICD-10-CM

## 2020-10-04 NOTE — Progress Notes (Signed)
Subjective: CC: vertigo, weight gain PCP: Jenna Norlander, DO  VVO:[id number] [name] is a 69 y.o. female presenting to clinic today for:  1. Vertigo Jenna reports vertigo for about 2 weeks. She reports feeling like the room is spinning. This usually happens when she is looking up or laying in the bed. She had not tried anything for it. She has not had any falls. She denies chest pain, shortness of breath, headaches, syncope, palpitations, or blurry vision. She has had vertigo in the past and went to [facility] rehad and had good relief. She would like to go again.   2. Weight gain Jenna Smith reports weight gain of about 20 lbs since August of 2021.  She reports that her weight fluctuates. She reports that she recently has been craving sweets. She feels like her diet is healthy overall. She does not eat fast foods and cooks her own meals. She eats fruits, vegetables, and lean meats. She has back pain and has been told that she should lose weight to help with her back pain. She is active all day long and does heavy yard work most days, but does not exercise daily. She does have a stationary bike a home at she can use, but has not been using this recently.   Relevant past medical, surgical, family, and social history reviewed and updated as indicated.  Allergies and medications reviewed and updated.  Allergies  Allergen Reactions  . Bactrim [Sulfamethoxazole-Trimethoprim] Shortness Of Breath  . Prednisone     Pt states she thinks this was due to her taking prednisone with her pain medications-has taken prednisone since and didn't have any problems  . Indomethacin Swelling    Face swells up  . Bee Venom   . Latex Hives  . Neosporin [Neomycin-Bacitracin Zn-Polymyx] Hives  . Penicillins Hives    Has patient had a PCN reaction causing immediate rash, facial/tongue/throat swelling, SOB or lightheadedness with hypotension: no Has patient had a PCN reaction causing severe rash  involving mucus membranes or skin necrosis: no Has patient had a PCN reaction that required hospitalization: no Has patient had a PCN reaction occurring within the last 10 years: no If all of the above answers are "NO", then may proceed with Cephalosporin use.    Past Medical History:  Diagnosis Date  . Allergy   . Anemia 12/22/2017    Current Outpatient Medications:  .  Acetaminophen (TYLENOL ARTHRITIS PAIN PO), Take by mouth., Disp: , Rfl:  .  cyclobenzaprine (FLEXERIL) 10 MG tablet, cyclobenzaprine 10 mg tablet, Disp: , Rfl:  Social History   Socioeconomic History  . Marital status: Married    Spouse name: Richardson Landry  . Number of children: 1  . Years of education: 36  . Highest education level: Some college, no degree  Occupational History  . Occupation: retired  Tobacco Use  . Smoking status: Former Smoker    Packs/day: 0.30    Years: 15.00    Pack years: 4.50    Types: Cigarettes    Quit date: 10/28/1984    Years since quitting: 35.9  . Smokeless tobacco: Never Used  . Tobacco comment: smoked "off and on" x 15 yrs  Vaping Use  . Vaping Use: Never used  Substance and Sexual Activity  . Alcohol use: No    Alcohol/week: 0.0 standard drinks    Comment: Holidays - occasional wine  . Drug use: No  . Sexual activity: Yes    Birth control/protection: Post-menopausal  Other Topics Concern  .  Not on file  Social History Narrative  . Not on file   Social Determinants of Health   Financial Resource Strain:   . Difficulty of Paying Living Expenses: Not on file  Food Insecurity:   . Worried About Charity fundraiser in the Last Year: Not on file  . Ran Out of Food in the Last Year: Not on file  Transportation Needs:   . Lack of Transportation (Medical): Not on file  . Lack of Transportation (Non-Medical): Not on file  Physical Activity:   . Days of Exercise per Week: Not on file  . Minutes of Exercise per Session: Not on file  Stress:   . Feeling of Stress : Not on file   Social Connections:   . Frequency of Communication with Friends and Family: Not on file  . Frequency of Social Gatherings with Friends and Family: Not on file  . Attends Religious Services: Not on file  . Active Member of Clubs or Organizations: Not on file  . Attends Archivist Meetings: Not on file  . Marital Status: Not on file  Intimate Partner Violence:   . Fear of Current or Ex-Partner: Not on file  . Emotionally Abused: Not on file  . Physically Abused: Not on file  . Sexually Abused: Not on file   Family History  Problem Relation Age of Onset  . Aneurysm Mother        passed away at age 41  . Cancer Father        passed away after Prostate cancer metastesize  . Cancer Sister        she passed away 39 due to Breast Cancer  . Stroke Sister   . Aneurysm Sister     Review of Systems  Per HPI.  Objective: Office vital signs reviewed. BP 107/71   Pulse 77   Temp 98 F (36.7 C) (Temporal)   Ht $R'5\' 6"'rw$  (1.676 m)   Wt 185 lb (83.9 kg)   BMI 29.86 kg/m   Physical Examination:  Physical Exam Vitals and nursing note reviewed.  Constitutional:      General: She is not in acute distress.    Appearance: Normal appearance. She is not ill-appearing, toxic-appearing or diaphoretic.  HENT:     Head: Normocephalic and atraumatic.  Eyes:     Extraocular Movements: Extraocular movements intact.     Conjunctiva/sclera: Conjunctivae normal.     Pupils: Pupils are equal, round, and reactive to light.  Neck:     Thyroid: No thyroid mass, thyromegaly or thyroid tenderness.  Cardiovascular:     Rate and Rhythm: Normal rate and regular rhythm.     Heart sounds: Normal heart sounds. No murmur heard.   Pulmonary:     Effort: Pulmonary effort is normal. No respiratory distress.     Breath sounds: Normal breath sounds.  Musculoskeletal:     Cervical back: No tenderness.     Right lower leg: No edema.     Left lower leg: No edema.  Skin:    General: Skin is warm and  dry.  Neurological:     General: No focal deficit present.     Mental Status: She is alert and oriented to person, place, and time.     Gait: Gait normal.  Psychiatric:        Mood and Affect: Mood normal.        Behavior: Behavior normal.      Results for orders placed or performed in  visit on 08/10/20  CMP14+EGFR  Result Value Ref Range   Glucose 85 65 - 99 mg/dL   BUN 22 8 - 27 mg/dL   Creatinine, Ser 0.89 0.57 - 1.00 mg/dL   GFR calc non Af Amer 67 >59 mL/min/1.73   GFR calc Af Amer 78 >59 mL/min/1.73   BUN/Creatinine Ratio 25 12 - 28   Sodium 143 134 - 144 mmol/L   Potassium 4.4 3.5 - 5.2 mmol/L   Chloride 104 96 - 106 mmol/L   CO2 28 20 - 29 mmol/L   Calcium 9.5 8.7 - 10.3 mg/dL   Total Protein 6.8 6.0 - 8.5 g/dL   Albumin 4.2 3.8 - 4.8 g/dL   Globulin, Total 2.6 1.5 - 4.5 g/dL   Albumin/Globulin Ratio 1.6 1.2 - 2.2   Bilirubin Total <0.2 0.0 - 1.2 mg/dL   Alkaline Phosphatase 101 44 - 121 IU/L   AST 14 0 - 40 IU/L   ALT 15 0 - 32 IU/L  CBC with Differential/Platelet  Result Value Ref Range   WBC 11.7 (H) 3.4 - 10.8 x10E3/uL   RBC 4.38 3.77 - 5.28 x10E6/uL   Hemoglobin 13.3 11.1 - 15.9 g/dL   Hematocrit 39.0 34.0 - 46.6 %   MCV 89 79 - 97 fL   MCH 30.4 26.6 - 33.0 pg   MCHC 34.1 31 - 35 g/dL   RDW 13.9 11.7 - 15.4 %   Platelets 247 150 - 450 x10E3/uL   Neutrophils 72 Not Estab. %   Lymphs 20 Not Estab. %   Monocytes 7 Not Estab. %   Eos 0 Not Estab. %   Basos 0 Not Estab. %   Neutrophils Absolute 8.4 (H) 1.40 - 7.00 x10E3/uL   Lymphocytes Absolute 2.4 0 - 3 x10E3/uL   Monocytes Absolute 0.8 0 - 0 x10E3/uL   EOS (ABSOLUTE) 0.0 0.0 - 0.4 x10E3/uL   Basophils Absolute 0.0 0 - 0 x10E3/uL   Immature Granulocytes 1 Not Estab. %   Immature Grans (Abs) 0.1 0.0 - 0.1 x10E3/uL  Lipid panel  Result Value Ref Range   Cholesterol, Total 255 (H) 100 - 199 mg/dL   Triglycerides 114 0 - 149 mg/dL   HDL 83 >39 mg/dL   VLDL Cholesterol Cal 20 5 - 40 mg/dL   LDL Chol  Calc (NIH) 152 (H) 0 - 99 mg/dL   Chol/HDL Ratio 3.1 0.0 - 4.4 ratio     Assessment/ Plan: Cherry was seen today for weight gain.  Diagnoses and all orders for this visit:  Vertigo Discussed meclizine, she is hesitant to try this but may pick it up OTC. Referral to PT placed. Handout given.  -     Ambulatory referral to Physical Therapy  Weight gain Weight gain of 4 lbs in last 2 months according to our records. BMI is 29. No recent thyroid panel. PCP notes reviewed from lab work in October, she requested repeat CBC and lipid panel, will get those today. Discussed mediterranean diet and daily moderate intensity exercise. Handouts given.  -     CBC with Differential -     Thyroid Panel With TSH -     Lipid panel  Follow up as needed with PCP.   The above assessment and management plan was discussed with the patient. The patient verbalized understanding of and has agreed to the management plan. Patient is aware to call the clinic if symptoms persist or worsen. Patient is aware when to return to the clinic for  a follow-up visit. Patient educated on when it is appropriate to go to the emergency department.   Marjorie Smolder, FNP-C [facility] [address] [place],  [id number] [phone]

## 2020-10-04 NOTE — Patient Instructions (Addendum)
Exercising to Lose Weight Exercise is structured, repetitive physical activity to improve fitness and health. Getting regular exercise is important for everyone. It is especially important if you are overweight. Being overweight increases your risk of heart disease, stroke, diabetes, high blood pressure, and several types of cancer. Reducing your calorie intake and exercising can help you lose weight. Exercise is usually categorized as moderate or vigorous intensity. To lose weight, most people need to do a certain amount of moderate-intensity or vigorous-intensity exercise each week. Moderate-intensity exercise  Moderate-intensity exercise is any activity that gets you moving enough to burn at least three times more energy (calories) than if you were sitting. Examples of moderate exercise include:  Walking a mile in 15 minutes.  Doing light yard work.  Biking at an easy pace. Most people should get at least 150 minutes (2 hours and 30 minutes) a week of moderate-intensity exercise to maintain their body weight. Vigorous-intensity exercise Vigorous-intensity exercise is any activity that gets you moving enough to burn at least six times more calories than if you were sitting. When you exercise at this intensity, you should be working hard enough that you are not able to carry on a conversation. Examples of vigorous exercise include:  Running.  Playing a team sport, such as football, basketball, and soccer.  Jumping rope. Most people should get at least 75 minutes (1 hour and 15 minutes) a week of vigorous-intensity exercise to maintain their body weight. How can exercise affect me? When you exercise enough to burn more calories than you eat, you lose weight. Exercise also reduces body fat and builds muscle. The more muscle you have, the more calories you burn. Exercise also:  Improves mood.  Reduces stress and tension.  Improves your overall fitness, flexibility, and  endurance.  Increases bone strength. The amount of exercise you need to lose weight depends on:  Your age.  The type of exercise.  Vertigo Vertigo is the feeling that you or your surroundings are moving when they are not. This feeling can come and go at any time. Vertigo often goes away on its own. Vertigo can be dangerous if it occurs while you are doing something that could endanger you or others, such as driving or operating machinery. Your health care provider will do tests to determine the cause of your vertigo. Tests will also help your health care provider decide how best to treat your condition. Follow these instructions at home: Eating and drinking      Drink enough fluid to keep your urine pale yellow.  Do not drink alcohol. Activity  Return to your normal activities as told by your health care provider. Ask your health care provider what activities are safe for you.  In the morning, first sit up on the side of the bed. When you feel okay, stand slowly while you hold onto something until you know that your balance is fine.  Move slowly. Avoid sudden body or head movements or certain positions, as told by your health care provider.  If you have trouble walking or keeping your balance, try using a cane for stability. If you feel dizzy or unstable, sit down right away.  Avoid doing any tasks that would cause danger to you or others if vertigo occurs.  Avoid bending down if you feel dizzy. Place items in your home so that they are easy for you to reach without leaning over.  Do not drive or use heavy machinery if you feel dizzy. General instructions  Take over-the-counter and prescription medicines only as told by your health care provider.  Keep all follow-up visits as told by your health care provider. This is important. Contact a health care provider if:  Your medicines do not relieve your vertigo or they make it worse.  You have a fever.  Your condition gets  worse or you develop new symptoms.  Your family or friends notice any behavioral changes.  Your nausea or vomiting gets worse.  You have numbness or a prickling and tingling sensation in part of your body. Get help right away if you:  Have difficulty moving or speaking.  Are always dizzy.  Faint.  Develop severe headaches.  Have weakness in your hands, arms, or legs.  Have changes in your hearing or vision.  Develop a stiff neck.  Develop sensitivity to light. Summary  Vertigo is the feeling that you or your surroundings are moving when they are not.  Your health care provider will do tests to determine the cause of your vertigo.  Follow instructions for home care. You may be told to avoid certain tasks, positions, or movements.  Contact a health care provider if your medicines do not relieve your symptoms, or if you have a fever, nausea, vomiting, or changes in behavior.  Get help right away if you have severe headaches or difficulty speaking, or you develop hearing or vision problems. This information is not intended to replace advice given to you by your health care provider. Make sure you discuss any questions you have with your health care provider. Document Revised: 09/07/2018 Document Reviewed: 09/07/2018 Elsevier Patient Education  [place].  Any health conditions you have.  Your overall physical ability. Talk to your health care provider about how much exercise you need and what types of activities are safe for you. What actions can I take to lose weight? Nutrition   Make changes to your diet as told by your health care provider or diet and nutrition specialist (dietitian). This may include: ? Eating fewer calories. ? Eating more protein. ? Eating less unhealthy fats. ? Eating a diet that includes fresh fruits and vegetables, whole grains, low-fat dairy products, and lean protein. ? Avoiding foods with added fat, salt, and sugar.  Drink plenty  of water while you exercise to prevent dehydration or heat stroke. Activity  Choose an activity that you enjoy and set realistic goals. Your health care provider can help you make an exercise plan that works for you.  Exercise at a moderate or vigorous intensity most days of the week. ? The intensity of exercise may vary from person to person. You can tell how intense a workout is for you by paying attention to your breathing and heartbeat. Most people will notice their breathing and heartbeat get faster with more intense exercise.  Do resistance training twice each week, such as: ? Push-ups. ? Sit-ups. ? Lifting weights. ? Using resistance bands.  Getting short amounts of exercise can be just as helpful as long structured periods of exercise. If you have trouble finding time to exercise, try to include exercise in your daily routine. ? Get up, stretch, and walk around every 30 minutes throughout the day. ? Go for a walk during your lunch break. ? Park your car farther away from your destination. ? If you take public transportation, get off one stop early and walk the rest of the way. ? Make phone calls while standing up and walking around. ? Take the stairs instead of  elevators or escalators.  Wear comfortable clothes and shoes with good support.  Do not exercise so much that you hurt yourself, feel dizzy, or get very short of breath. Where to find more information  U.S. Department of Health and Human Services: [employer].at  Centers for Disease Control and Prevention (CDC): http://www.wolf.info/ Contact a health care provider:  Before starting a new exercise program.  If you have questions or concerns about your weight.  If you have a medical problem that keeps you from exercising. Get help right away if you have any of the following while exercising:  Injury.  Dizziness.  Difficulty breathing or shortness of breath that does not go away when you stop exercising.  Chest  pain.  Rapid heartbeat. Summary  Being overweight increases your risk of heart disease, stroke, diabetes, high blood pressure, and several types of cancer.  Losing weight happens when you burn more calories than you eat.  Reducing the amount of calories you eat in addition to getting regular moderate or vigorous exercise each week helps you lose weight. This information is not intended to replace advice given to you by your health care provider. Make sure you discuss any questions you have with your health care provider. Document Revised: 10/27/2017 Document Reviewed: 10/27/2017 Elsevier Patient Education  2020 [place] refers to food and lifestyle choices that are based on the traditions of countries located on the [employer]. This way of eating has been shown to help prevent certain conditions and improve outcomes for people who have chronic diseases, like kidney disease and heart disease. What are tips for following this plan? Lifestyle  Cook and eat meals together with your family, when possible.  Drink enough fluid to keep your urine clear or pale yellow.  Be physically active every day. This includes: ? Aerobic exercise like running or swimming. ? Leisure activities like gardening, walking, or housework.  Get 7-8 hours of sleep each night.  If recommended by your health care provider, drink red wine in moderation. This means 1 glass a day for nonpregnant women and 2 glasses a day for men. A glass of wine equals 5 oz (150 mL). Reading food labels   Check the serving size of packaged foods. For foods such as rice and pasta, the serving size refers to the amount of cooked product, not dry.  Check the total fat in packaged foods. Avoid foods that have saturated fat or trans fats.  Check the ingredients list for added sugars, such as corn syrup. Shopping  At the grocery store, buy most of your food from the areas near  the walls of the store. This includes: ? Fresh fruits and vegetables (produce). ? Grains, beans, nuts, and seeds. Some of these may be available in unpackaged forms or large amounts (in bulk). ? Fresh seafood. ? Poultry and eggs. ? Low-fat dairy products.  Buy whole ingredients instead of prepackaged foods.  Buy fresh fruits and vegetables in-season from local farmers markets.  Buy frozen fruits and vegetables in resealable bags.  If you do not have access to quality fresh seafood, buy precooked frozen shrimp or canned fish, such as tuna, salmon, or sardines.  Buy small amounts of raw or cooked vegetables, salads, or olives from the deli or salad bar at your store.  Stock your pantry so you always have certain foods on hand, such as olive oil, canned tuna, canned tomatoes, rice, pasta, and beans. Cooking  Cook foods with extra-virgin  olive oil instead of using butter or other vegetable oils.  Have meat as a side dish, and have vegetables or grains as your main dish. This means having meat in small portions or adding small amounts of meat to foods like pasta or stew.  Use beans or vegetables instead of meat in common dishes like chili or lasagna.  Experiment with different cooking methods. Try roasting or broiling vegetables instead of steaming or sauteing them.  Add frozen vegetables to soups, stews, pasta, or rice.  Add nuts or seeds for added healthy fat at each meal. You can add these to yogurt, salads, or vegetable dishes.  Marinate fish or vegetables using olive oil, lemon juice, garlic, and fresh herbs. Meal planning   Plan to eat 1 vegetarian meal one day each week. Try to work up to 2 vegetarian meals, if possible.  Eat seafood 2 or more times a week.  Have healthy snacks readily available, such as: ? Vegetable sticks with hummus. ? [place] yogurt. ? Fruit and nut trail mix.  Eat balanced meals throughout the week. This includes: ? Fruit: 2-3 servings a  day ? Vegetables: 4-5 servings a day ? Low-fat dairy: 2 servings a day ? Fish, poultry, or lean meat: 1 serving a day ? Beans and legumes: 2 or more servings a week ? Nuts and seeds: 1-2 servings a day ? Whole grains: 6-8 servings a day ? Extra-virgin olive oil: 3-4 servings a day  Limit red meat and sweets to only a few servings a month What are my food choices?  Mediterranean diet ? Recommended  Grains: Whole-grain pasta. Brown rice. Bulgar wheat. Polenta. Couscous. Whole-wheat bread. [place].  Vegetables: Artichokes. Beets. Broccoli. Cabbage. Carrots. Eggplant. Green beans. Chard. Kale. Spinach. Onions. Leeks. Peas. Squash. Tomatoes. Peppers. Radishes.  Fruits: Apples. Apricots. Avocado. Berries. Bananas. Cherries. Dates. Figs. Grapes. Lemons. Melon. Oranges. Peaches. Plums. Pomegranate.  Meats and other protein foods: Beans. Almonds. Sunflower seeds. Pine nuts. Peanuts. [place]. Salmon. Scallops. Shrimp. [place]. Tilapia. Clams. Oysters. Eggs.  Dairy: Low-fat milk. Cheese. Greek yogurt.  Beverages: Water. Red wine. Herbal tea.  Fats and oils: Extra virgin olive oil. Avocado oil. Grape seed oil.  Sweets and desserts: [place] yogurt with honey. Baked apples. Poached pears. Trail mix.  Seasoning and other foods: Basil. Cilantro. Coriander. Cumin. Mint. Parsley. Sage. Rosemary. Tarragon. Garlic. Oregano. Thyme. Pepper. Balsalmic vinegar. Tahini. Hummus. Tomato sauce. Olives. Mushrooms. ? Limit these  Grains: Prepackaged pasta or rice dishes. Prepackaged cereal with added sugar.  Vegetables: Deep fried potatoes (french fries).  Fruits: Fruit canned in syrup.  Meats and other protein foods: Beef. Pork. Lamb. Poultry with skin. Hot dogs. [place] Salines.  Dairy: Ice cream. Sour cream. Whole milk.  Beverages: Juice. Sugar-sweetened soft drinks. Beer. Liquor and spirits.  Fats and oils: Butter. Canola oil. Vegetable oil. Beef fat (tallow). Lard.  Sweets and desserts: Cookies. Cakes.  Pies. Candy.  Seasoning and other foods: Mayonnaise. Premade sauces and marinades. The items listed may not be a complete list. Talk with your dietitian about what dietary choices are right for you. Summary  The Mediterranean diet includes both food and lifestyle choices.  Eat a variety of fresh fruits and vegetables, beans, nuts, seeds, and whole grains.  Limit the amount of red meat and sweets that you eat.  Talk with your health care provider about whether it is safe for you to drink red wine in moderation. This means 1 glass a day for nonpregnant women and 2 glasses a day for  men. A glass of wine equals 5 oz (150 mL). This information is not intended to replace advice given to you by your health care provider. Make sure you discuss any questions you have with your health care provider. Document Revised: 06/13/2016 Document Reviewed: 06/06/2016 Elsevier Patient Education  [place].

## 2020-10-05 DIAGNOSIS — M9902 Segmental and somatic dysfunction of thoracic region: Secondary | ICD-10-CM | POA: Diagnosis not present

## 2020-10-05 DIAGNOSIS — M9901 Segmental and somatic dysfunction of cervical region: Secondary | ICD-10-CM | POA: Diagnosis not present

## 2020-10-05 DIAGNOSIS — M9903 Segmental and somatic dysfunction of lumbar region: Secondary | ICD-10-CM | POA: Diagnosis not present

## 2020-10-05 DIAGNOSIS — M62830 Muscle spasm of back: Secondary | ICD-10-CM | POA: Diagnosis not present

## 2020-10-05 LAB — CBC WITH DIFFERENTIAL/PLATELET
Basophils Absolute: 0 10*3/uL (ref 0.0–0.2)
Basos: 0 %
EOS (ABSOLUTE): 0.1 10*3/uL (ref 0.0–0.4)
Eos: 2 %
Hematocrit: 42.7 % (ref 34.0–46.6)
Hemoglobin: 14.3 g/dL (ref 11.1–15.9)
Immature Grans (Abs): 0.1 10*3/uL (ref 0.0–0.1)
Immature Granulocytes: 1 %
Lymphocytes Absolute: 2 10*3/uL (ref 0.7–3.1)
Lymphs: 35 %
MCH: 28.8 pg (ref 26.6–33.0)
MCHC: 33.5 g/dL (ref 31.5–35.7)
MCV: 86 fL (ref 79–97)
Monocytes Absolute: 0.4 10*3/uL (ref 0.1–0.9)
Monocytes: 8 %
Neutrophils Absolute: 3.1 10*3/uL (ref 1.4–7.0)
Neutrophils: 54 %
Platelets: 257 10*3/uL (ref 150–450)
RBC: 4.96 x10E6/uL (ref 3.77–5.28)
RDW: 14 % (ref 11.7–15.4)
WBC: 5.7 10*3/uL (ref 3.4–10.8)

## 2020-10-05 LAB — THYROID PANEL WITH TSH
Free Thyroxine Index: 2 (ref 1.2–4.9)
T3 Uptake Ratio: 28 % (ref 24–39)
T4, Total: 7 ug/dL (ref 4.5–12.0)
TSH: 2.58 u[IU]/mL (ref 0.450–4.500)

## 2020-10-05 LAB — LIPID PANEL
Chol/HDL Ratio: 3.4 ratio (ref 0.0–4.4)
Cholesterol, Total: 261 mg/dL — ABNORMAL HIGH (ref 100–199)
HDL: 76 mg/dL (ref >39)
LDL Chol Calc (NIH): 174 mg/dL — ABNORMAL HIGH (ref 0–99)
Triglycerides: 68 mg/dL (ref 0–149)
VLDL Cholesterol Cal: 11 mg/dL (ref 5–40)

## 2020-10-06 ENCOUNTER — Encounter (HOSPITAL_COMMUNITY): Payer: Self-pay | Admitting: Physical Therapy

## 2020-10-06 ENCOUNTER — Ambulatory Visit (HOSPITAL_COMMUNITY): Payer: Medicare Other | Attending: Family Medicine | Admitting: Physical Therapy

## 2020-10-06 ENCOUNTER — Other Ambulatory Visit: Payer: Self-pay

## 2020-10-06 DIAGNOSIS — R42 Dizziness and giddiness: Secondary | ICD-10-CM | POA: Diagnosis not present

## 2020-10-06 DIAGNOSIS — H8112 Benign paroxysmal vertigo, left ear: Secondary | ICD-10-CM | POA: Insufficient documentation

## 2020-10-06 DIAGNOSIS — H8111 Benign paroxysmal vertigo, right ear: Secondary | ICD-10-CM

## 2020-10-06 NOTE — Therapy (Signed)
[facility] [address] [place], [place], [address] Phone: [phone]   Fax:  [phone]  Physical Therapy Evaluation  Patient Details  Name: Jenna Smith MRN: [id number] Date of Birth: 1950/11/11 Referring Provider (PT): Marjorie Smolder   Encounter Date: 10/06/2020   PT End of Session - 10/06/20 1015    Visit Number 1    Number of Visits 4    Date for PT Re-Evaluation 11/05/20    Authorization Type [employer] medicare    Authorization - Visit Number 1    Authorization - Number of Visits 4    Progress Note Due on Visit 4    PT Start Time 0918    PT Stop Time 1005    PT Time Calculation (min) 47 min    Activity Tolerance Patient tolerated treatment well    Behavior During Therapy [facility] for tasks assessed/performed           Past Medical History:  Diagnosis Date  . Allergy   . Anemia 12/22/2017    Past Surgical History:  Procedure Laterality Date  . BREAST SURGERY Bilateral May 2001   Breast reduction  . COSMETIC SURGERY  September 2012   Face lift  . REDUCTION MAMMAPLASTY    . TONSILLECTOMY    . 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   Procedure: RIGHT TOTAL KNEE ARTHROPLASTY;  Surgeon: [name], MD;  Location: [facility];  Service: Orthopedics;  Laterality: Right;    There were no vitals filed for this visit.    Subjective Assessment - 10/06/20 0906    Subjective Pt states that she has been experiencing vertigo for about two weeks now.  [name] states that she is mostly affected if she lies down or turns to the right or left.  She is not sure which is worse.    Pertinent History OA/ TKR    Limitations House hold activities    Currently in Pain? No/denies              [facility] PT Assessment - 10/06/20 0001      Assessment   Medical Diagnosis vertigo    Referring Provider (PT) Tiffany Lilia Pro    Onset Date/Surgical Date 09/27/20   PT has had three episodes this year   Next MD Visit not scheduled     Prior Therapy 2x this year      Precautions   Precautions None      Restrictions   Weight Bearing Restrictions No      Balance Screen   Has the patient fallen in the past 6 months No    Has the patient had a decrease in activity level because of a fear of falling?  No    Is the patient reluctant to leave their home because of a fear of falling?  No      Prior Function   Level of Independence Independent      Cognition   Overall Cognitive Status Within Functional Limits for tasks assessed      Functional Tests   Functional tests Single leg stance      Single Leg Stance   Comments RT: 60"; LT 27"      ROM / Strength   AROM / PROM / Strength AROM      AROM   AROM Assessment Site Cervical    Cervical - Right Rotation 45    Cervical - Left Rotation 50  full range eye motion as well as with Rt Dix-Halpike manuever.  She also has decreased cervical ROM and decreased balance.  [name] will benefit from skillled physical therapy to address these issues to decrease her risk of falling and improve her quality of life.    Personal Factors and Comorbidities Past/Current Experience    Examination-Activity Limitations Sleep    Examination-Participation Restrictions Yard  Work    Stability/Clinical Decision Making Evolving/Moderate complexity    Clinical Decision Making Moderate    Rehab Potential Good    PT Frequency 1x / week    PT Duration 4 weeks    PT Treatment/Interventions Patient/family education;Therapeutic exercise;Other (comment);Manual techniques   cannaith repositioning   PT Next Visit Plan complete Rt dix-halpike and Eply's if indicated, soft tissue massage to improve cervical rotation and higher balance activity for Lt side    PT Home Exercise Plan sheet on BPPV, self treatment, cervical ROM and eye exercises           Patient will benefit from skilled therapeutic intervention in order to improve the following deficits and impairments:  Decreased range of motion,Dizziness,Decreased balance  Visit Diagnosis: BPPV (benign paroxysmal positional vertigo), right     Problem List Patient Active Problem List   Diagnosis Date Noted  . 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 Syncope 12/22/2017  . Hypokalemia 12/22/2017  . Anemia 12/22/2017  . 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 Tendonitis of knee, right 06/11/2017  . Obesity (BMI 30-39.9) 06/21/2016  . Allergic rhinitis 02/26/2016  . Right knee pain 02/26/2016    Rayetta Humphrey, PT CLT [phone] 10/06/2020, 10:29 AM  [facility] [address] [place], [place], [address] Phone: [phone]   Fax:  [phone]  Name: Jenna Smith MRN: [id number] Date of Birth: Jul 12, 1951  [facility] [address] [place], [place], [address] Phone: [phone]   Fax:  [phone]  Physical Therapy Evaluation  Patient Details  Name: Jenna Smith MRN: [id number] Date of Birth: 1950/11/11 Referring Provider (PT): Marjorie Smolder   Encounter Date: 10/06/2020   PT End of Session - 10/06/20 1015    Visit Number 1    Number of Visits 4    Date for PT Re-Evaluation 11/05/20    Authorization Type [employer] medicare    Authorization - Visit Number 1    Authorization - Number of Visits 4    Progress Note Due on Visit 4    PT Start Time 0918    PT Stop Time 1005    PT Time Calculation (min) 47 min    Activity Tolerance Patient tolerated treatment well    Behavior During Therapy [facility] for tasks assessed/performed           Past Medical History:  Diagnosis Date  . Allergy   . Anemia 12/22/2017    Past Surgical History:  Procedure Laterality Date  . BREAST SURGERY Bilateral May 2001   Breast reduction  . COSMETIC SURGERY  September 2012   Face lift  . REDUCTION MAMMAPLASTY    . TONSILLECTOMY    . 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   Procedure: RIGHT TOTAL KNEE ARTHROPLASTY;  Surgeon: [name], MD;  Location: [facility];  Service: Orthopedics;  Laterality: Right;    There were no vitals filed for this visit.    Subjective Assessment - 10/06/20 0906    Subjective Pt states that she has been experiencing vertigo for about two weeks now.  [name] states that she is mostly affected if she lies down or turns to the right or left.  She is not sure which is worse.    Pertinent History OA/ TKR    Limitations House hold activities    Currently in Pain? No/denies              [facility] PT Assessment - 10/06/20 0001      Assessment   Medical Diagnosis vertigo    Referring Provider (PT) Tiffany Lilia Pro    Onset Date/Surgical Date 09/27/20   PT has had three episodes this year   Next MD Visit not scheduled     Prior Therapy 2x this year      Precautions   Precautions None      Restrictions   Weight Bearing Restrictions No      Balance Screen   Has the patient fallen in the past 6 months No    Has the patient had a decrease in activity level because of a fear of falling?  No    Is the patient reluctant to leave their home because of a fear of falling?  No      Prior Function   Level of Independence Independent      Cognition   Overall Cognitive Status Within Functional Limits for tasks assessed      Functional Tests   Functional tests Single leg stance      Single Leg Stance   Comments RT: 60"; LT 27"      ROM / Strength   AROM / PROM / Strength AROM      AROM   AROM Assessment Site Cervical    Cervical - Right Rotation 45    Cervical - Left Rotation 50

## 2020-10-09 DIAGNOSIS — M62830 Muscle spasm of back: Secondary | ICD-10-CM | POA: Diagnosis not present

## 2020-10-09 DIAGNOSIS — M9903 Segmental and somatic dysfunction of lumbar region: Secondary | ICD-10-CM | POA: Diagnosis not present

## 2020-10-09 DIAGNOSIS — M9902 Segmental and somatic dysfunction of thoracic region: Secondary | ICD-10-CM | POA: Diagnosis not present

## 2020-10-09 DIAGNOSIS — M9901 Segmental and somatic dysfunction of cervical region: Secondary | ICD-10-CM | POA: Diagnosis not present

## 2020-10-13 ENCOUNTER — Ambulatory Visit (HOSPITAL_COMMUNITY): Payer: Medicare Other

## 2020-10-13 ENCOUNTER — Encounter (HOSPITAL_COMMUNITY): Payer: Self-pay | Admitting: Physical Therapy

## 2020-10-13 ENCOUNTER — Other Ambulatory Visit: Payer: Self-pay

## 2020-10-13 DIAGNOSIS — H8111 Benign paroxysmal vertigo, right ear: Secondary | ICD-10-CM

## 2020-10-13 DIAGNOSIS — R42 Dizziness and giddiness: Secondary | ICD-10-CM | POA: Diagnosis not present

## 2020-10-13 DIAGNOSIS — H8112 Benign paroxysmal vertigo, left ear: Secondary | ICD-10-CM

## 2020-10-13 NOTE — Therapy (Addendum)
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   Tendonitis of knee, right 06/11/2017   Obesity (BMI 30-39.9) 06/21/2016   Allergic rhinitis 02/26/2016   Right knee pain 02/26/2016   Ihor Austin, LPTA/CLT; CBIS [place], PT CLT [phone] 10/13/2020, 9:28 AM  [facility] [place], [place], [address] Phone: [phone]   Fax:  [phone]  Name: Jenna Smith MRN: [id number] Date of Birth: [birth year]  [facility] [place], [place], [address] Phone: [phone]   Fax:  [phone]  Physical Therapy Treatment  Patient Details  Name: Jenna Smith MRN: [id number] Date of Birth: Jul 13, 1951 Referring Provider (PT): Marjorie Smolder   Encounter Date: 10/13/2020   PT End of Session - 10/13/20 0835    Visit Number 2    Number of Visits 4    Date for PT Re-Evaluation 11/05/20    Authorization Type [employer] - Visit Number 2    Authorization - Number of Visits 4    Progress Note Due on Visit 4    PT Start Time 0832    PT Stop Time 0912    PT Time Calculation (min) 40 min    Activity Tolerance Patient tolerated treatment well    Behavior During Therapy [facility] for tasks assessed/performed           Past Medical History:  Diagnosis Date   Allergy    Anemia 12/22/2017    Past Surgical History:  Procedure Laterality Date   BREAST SURGERY Bilateral May 2001   Breast reduction   COSMETIC SURGERY  September 2012   Face lift   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   Procedure: RIGHT TOTAL KNEE ARTHROPLASTY;  Surgeon: [name], MD;  Location: [facility];  Service: Orthopedics;  Laterality: Right;    There were no vitals filed for this visit.   Subjective Assessment - 10/13/20 0834    Subjective Pt reports dizziness has resolved since last session.    Pertinent History OA/ TKR    Currently in Pain? No/denies              [facility] PT Assessment - 10/13/20 0001      Assessment   Medical Diagnosis vertigo    Referring Provider (PT) Tiffany Lilia Pro    Onset Date/Surgical Date 09/27/20   Pt has had 3 episodes this year   Next MD Visit not scheduled    Prior Therapy 2x this year      Functional Tests   Functional tests Single leg stance      Single Leg Stance   Comments Rt 60", Lt 33"      AROM   Cervical - Right Rotation 56     Cervical - Left Rotation 54               Vestibular Assessment - 10/13/20 0001      Positional Testing   Dix-Hallpike Dix-Hallpike Right;Dix-Hallpike Left      Dix-Hallpike Right   Dix-Hallpike Right Duration mild 10 seconds      Dix-Hallpike Left   Dix-Hallpike Left Duration negative                    [facility] Adult PT Treatment/Exercise - 10/13/20 0001      Manual Therapy   Manual Therapy Soft tissue mobilization    Manual therapy comments Manual complete separate than rest of tx    Soft tissue mobilization Supine STM to cervical mm           Vestibular Treatment/Exercise - 10/13/20 0001      Vestibular Treatment/Exercise   Vestibular Treatment Provided Habituation;Gaze    Habituation Exercises Nestor Lewandowsky    Gaze Exercises Eye/Head Exercise Horizontal;Eye/Head Exercise Vertical;X2 Viewing Horizontal;X2 Viewing Vertical      X2 Viewing Horizontal   Foot Position seated  [facility] [place], [place], [address] Phone: [phone]   Fax:  [phone]  Physical Therapy Treatment  Patient Details  Name: Jenna Smith MRN: [id number] Date of Birth: Jul 13, 1951 Referring Provider (PT): Marjorie Smolder   Encounter Date: 10/13/2020   PT End of Session - 10/13/20 0835    Visit Number 2    Number of Visits 4    Date for PT Re-Evaluation 11/05/20    Authorization Type [employer] - Visit Number 2    Authorization - Number of Visits 4    Progress Note Due on Visit 4    PT Start Time 0832    PT Stop Time 0912    PT Time Calculation (min) 40 min    Activity Tolerance Patient tolerated treatment well    Behavior During Therapy [facility] for tasks assessed/performed           Past Medical History:  Diagnosis Date   Allergy    Anemia 12/22/2017    Past Surgical History:  Procedure Laterality Date   BREAST SURGERY Bilateral May 2001   Breast reduction   COSMETIC SURGERY  September 2012   Face lift   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   Procedure: RIGHT TOTAL KNEE ARTHROPLASTY;  Surgeon: [name], MD;  Location: [facility];  Service: Orthopedics;  Laterality: Right;    There were no vitals filed for this visit.   Subjective Assessment - 10/13/20 0834    Subjective Pt reports dizziness has resolved since last session.    Pertinent History OA/ TKR    Currently in Pain? No/denies              [facility] PT Assessment - 10/13/20 0001      Assessment   Medical Diagnosis vertigo    Referring Provider (PT) Tiffany Lilia Pro    Onset Date/Surgical Date 09/27/20   Pt has had 3 episodes this year   Next MD Visit not scheduled    Prior Therapy 2x this year      Functional Tests   Functional tests Single leg stance      Single Leg Stance   Comments Rt 60", Lt 33"      AROM   Cervical - Right Rotation 56     Cervical - Left Rotation 54               Vestibular Assessment - 10/13/20 0001      Positional Testing   Dix-Hallpike Dix-Hallpike Right;Dix-Hallpike Left      Dix-Hallpike Right   Dix-Hallpike Right Duration mild 10 seconds      Dix-Hallpike Left   Dix-Hallpike Left Duration negative                    [facility] Adult PT Treatment/Exercise - 10/13/20 0001      Manual Therapy   Manual Therapy Soft tissue mobilization    Manual therapy comments Manual complete separate than rest of tx    Soft tissue mobilization Supine STM to cervical mm           Vestibular Treatment/Exercise - 10/13/20 0001      Vestibular Treatment/Exercise   Vestibular Treatment Provided Habituation;Gaze    Habituation Exercises Nestor Lewandowsky    Gaze Exercises Eye/Head Exercise Horizontal;Eye/Head Exercise Vertical;X2 Viewing Horizontal;X2 Viewing Vertical      X2 Viewing Horizontal   Foot Position seated

## 2020-10-13 NOTE — Patient Instructions (Addendum)
Horizontal Canal    Sit on bed, head straight forward. Lie slowly on right side. Hold position until symptoms subside. Sit up. Hold position until symptoms subside. Repeat 5 times. Hold position until symptoms subside. Now repeat to opposite side.  Sit up. Repeat sequence 5 times per session. Do 1 sessions per day.  Copyright  VHI. All rights reserved.

## 2020-10-17 ENCOUNTER — Ambulatory Visit: Payer: Medicare Other | Admitting: Family Medicine

## 2020-10-19 ENCOUNTER — Other Ambulatory Visit: Payer: Self-pay

## 2020-10-19 ENCOUNTER — Ambulatory Visit (HOSPITAL_COMMUNITY): Payer: Medicare Other | Admitting: Physical Therapy

## 2020-10-19 ENCOUNTER — Encounter (HOSPITAL_COMMUNITY): Payer: Self-pay | Admitting: Physical Therapy

## 2020-10-19 DIAGNOSIS — H8111 Benign paroxysmal vertigo, right ear: Secondary | ICD-10-CM | POA: Diagnosis not present

## 2020-10-19 DIAGNOSIS — H8112 Benign paroxysmal vertigo, left ear: Secondary | ICD-10-CM

## 2020-10-19 DIAGNOSIS — R42 Dizziness and giddiness: Secondary | ICD-10-CM | POA: Diagnosis not present

## 2020-10-19 NOTE — Therapy (Signed)
for a week    Time 4    Period Weeks    Status New    Target Date 11/03/20      PT LONG TERM GOAL #2   Title PT will verbalize that she has tried and is comfortable with self repositioning techniques    Time 4    Period Weeks                 Plan - 10/19/20 1634    Clinical Impression Statement Patient presents to clinic with complaint of increased vertigo over past several days. Patient denies any change in activity, and reports that symptoms continue to be positional in nature. Performed Eppley maneuver bilaterally. Patient with noted LT torsional nystagmus with Eppleys to RT side. Asymptomatic on LT. Asymptomatic with repeat Eppleys maneuver on RT. Patient with no complaint of dizziness or vertigo symptoms at end of session. Educated patient on proper posture and positioning at home and at night when sleeping to improve symptoms resolution with performed canolithic repositioning. Patient will benefit from continued therapy services to reassess vertigo symptoms and to continue treatments for abolished vertigo symptoms for improved quality of life and reduced risk for falls.    Personal Factors and Comorbidities Past/Current Experience    Examination-Activity Limitations Sleep     Examination-Participation Restrictions Yard Work    Stability/Clinical Decision Making Evolving/Moderate complexity    Rehab Potential Good    PT Frequency 1x / week    PT Duration 4 weeks    PT Treatment/Interventions Patient/family education;Therapeutic exercise;Other (comment);Manual techniques    PT Next Visit Plan Assess response to Eppleys last visit and if symptoms still persist. complete Rt dix-halpike and Eply's if indicated, soft tissue massage to improve cervical rotation and higher balance activity for Lt side    PT Home Exercise Plan sheet on BPPV, self treatment, cervical ROM and eye exercises; 10/13/20: Habituation sidelying exercise           Patient will benefit from skilled therapeutic intervention in order to improve the following deficits and impairments:  Decreased range of motion,Dizziness,Decreased balance  Visit Diagnosis: Dizziness and giddiness  BPPV (benign paroxysmal positional vertigo), left  BPPV (benign paroxysmal positional vertigo), right     Problem List Patient Active Problem List   Diagnosis Date Noted  . 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 Syncope 12/22/2017  . Hypokalemia 12/22/2017  . Anemia 12/22/2017  . 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 Tendonitis of knee, right 06/11/2017  . Obesity (BMI 30-39.9) 06/21/2016  . Allergic rhinitis 02/26/2016  . Right knee pain 02/26/2016    4:37 PM, 10/19/20 Josue Hector PT DPT  Physical Therapist with [facility]  [phone] [place] [address] [place], [place], [address] Phone: [phone]   Fax:  [phone]  Name: Jenna Smith MRN: [id number] Date of Birth: 10/12/1951  [facility] [address] [place], [place], [address] Phone: [phone]   Fax:  [phone]  Physical Therapy Treatment  Patient Details  Name: Jenna Smith MRN: [id number] Date of Birth: 01/16/1951 Referring Provider (PT): Marjorie Smolder   Encounter Date: 10/19/2020   PT End of Session - 10/19/20 1405    Visit Number 3    Number of Visits 4    Date for PT Re-Evaluation 11/05/20    Authorization Type [employer] medicare    Authorization - Visit Number 3    Authorization - Number of Visits 4    Progress Note Due on Visit 4    PT Start Time 5329    PT Stop Time 1430    PT Time Calculation (min) 35 min    Activity Tolerance Patient tolerated treatment well    Behavior During Therapy [facility] for tasks assessed/performed           Past Medical History:  Diagnosis Date  . Allergy   . Anemia 12/22/2017    Past Surgical History:  Procedure Laterality Date  . BREAST SURGERY Bilateral May 2001   Breast reduction  . COSMETIC SURGERY  September 2012   Face lift  . REDUCTION MAMMAPLASTY    . TONSILLECTOMY    . 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   Procedure: RIGHT TOTAL KNEE ARTHROPLASTY;  Surgeon: [name], MD;  Location: [facility];  Service: Orthopedics;  Laterality: Right;    There were no vitals filed for this visit.   Subjective Assessment - 10/19/20 1403    Subjective Patient says she has had more dizziness the past week. Not sure why. Says she spins when she lays in the bed. Dizzy when cleaning. Wonders if stress can cause dizziness. States she has been very stressed out lately.    Pertinent History OA/ TKR    Currently in Pain? No/denies                              Vestibular Treatment/Exercise - 10/19/20 0001      Vestibular Treatment/Exercise   Canalith Repositioning Epley Manuever Right;Epley Manuever Left       EPLEY MANUEVER RIGHT   Number of Reps  2    Overall Response Improved  Symptoms    Response Details  LT torsional nystagmus for about 5 seconds, resolved by 2nd rep       EPLEY MANUEVER LEFT   Number of Reps  1     RESPONSE DETAILS LEFT No dizziness, no nystagmus                   PT Short Term Goals - 10/06/20 1024      PT SHORT TERM GOAL #1   Title PT to be completing HEP to decrease sx of dizziness to no more than 2x a week    Time 2    Period Weeks    Status New    Target Date 10/20/20      PT SHORT TERM GOAL #2   Title PT to be able to rotate cervical area 60 degrees B to improve safety of driving    Time 2    Period Weeks    Status New             PT Long Term Goals - 10/06/20 1025      PT LONG TERM GOAL #1   Title PT to report no dizziness

## 2020-10-23 ENCOUNTER — Other Ambulatory Visit: Payer: Self-pay

## 2020-10-23 ENCOUNTER — Encounter: Payer: Self-pay | Admitting: Family Medicine

## 2020-10-23 ENCOUNTER — Ambulatory Visit (INDEPENDENT_AMBULATORY_CARE_PROVIDER_SITE_OTHER): Payer: Medicare Other | Admitting: Family Medicine

## 2020-10-23 VITALS — Temp 97.4°F | Ht 66.0 in | Wt 185.0 lb

## 2020-10-23 DIAGNOSIS — I951 Orthostatic hypotension: Secondary | ICD-10-CM | POA: Diagnosis not present

## 2020-10-23 DIAGNOSIS — R42 Dizziness and giddiness: Secondary | ICD-10-CM | POA: Diagnosis not present

## 2020-10-23 DIAGNOSIS — M792 Neuralgia and neuritis, unspecified: Secondary | ICD-10-CM

## 2020-10-23 DIAGNOSIS — Z8679 Personal history of other diseases of the circulatory system: Secondary | ICD-10-CM | POA: Diagnosis not present

## 2020-10-23 MED ORDER — DICLOFENAC SODIUM 1 % EX GEL: 2.0000 g | Freq: Two times a day (BID) | CUTANEOUS | 0 refills | Status: DC | PRN

## 2020-10-23 NOTE — Patient Instructions (Addendum)
Hydrate, use the compression hose, change positions slowly. Use ice and arthritis cream on that left hand.  Use a brace a night time to decompress the wrist.  Hypotension As your heart beats, it forces blood through your body. This force is called blood pressure. If you have hypotension, you have low blood pressure. When your blood pressure is too low, you may not get enough blood to your brain or other parts of your body. This may cause you to feel weak, light-headed, have a fast heartbeat, or even pass out (faint). Low blood pressure may be harmless, or it may cause serious problems. What are the causes?  Blood loss.  Not enough water in the body (dehydration).  Heart problems.  Hormone problems.  Pregnancy.  A very bad infection.  Not having enough of certain nutrients.  Very bad allergic reactions.  Certain medicines. What increases the risk?  Age. The risk increases as you get older.  Conditions that affect the heart or the brain and spinal cord (central nervous system).  Taking certain medicines.  Being pregnant. What are the signs or symptoms?  Feeling: ? Weak. ? Light-headed. ? Dizzy. ? Tired (fatigued).  Blurred vision.  Fast heartbeat.  Passing out, in very bad cases. How is this treated?  Changing your diet. This may involve eating more salt (sodium) or drinking more water.  Taking medicines to raise your blood pressure.  Changing how much you take (the dosage) of some of your medicines.  Wearing compression stockings. These stockings help to prevent blood clots and reduce swelling in your legs. In some cases, you may need to go to the hospital for:  Fluid replacement. This means you will receive fluids through an IV tube.  Blood replacement. This means you will receive donated blood through an IV tube (transfusion).  Treating an infection or heart problems, if this applies.  Monitoring. You may need to be monitored while medicines that you  are taking wear off. Follow these instructions at home: Eating and drinking   Drink enough fluids to keep your pee (urine) pale yellow.  Eat a healthy diet. Follow instructions from your doctor about what you can eat or drink. A healthy diet includes: ? Fresh fruits and vegetables. ? Whole grains. ? Low-fat (lean) meats. ? Low-fat dairy products.  Eat extra salt only as told. Do not add extra salt to your diet unless your doctor tells you to.  Eat small meals often.  Avoid standing up quickly after you eat. Medicines  Take over-the-counter and prescription medicines only as told by your doctor. ? Follow instructions from your doctor about changing how much you take of your medicines, if this applies. ? Do not stop or change any of your medicines on your own. General instructions   Wear compression stockings as told by your doctor.  Get up slowly from lying down or sitting.  Avoid hot showers and a lot of heat as told by your doctor.  Return to your normal activities as told by your doctor. Ask what activities are safe for you.  Do not use any products that contain nicotine or tobacco, such as cigarettes, e-cigarettes, and chewing tobacco. If you need help quitting, ask your doctor.  Keep all follow-up visits as told by your doctor. This is important. Contact a doctor if:  You throw up (vomit).  You have watery poop (diarrhea).  You have a fever for more than 2-3 days.  You feel more thirsty than normal.  You feel  weak and tired. Get help right away if:  You have chest pain.  You have a fast or uneven heartbeat.  You lose feeling (have numbness) in any part of your body.  You cannot move your arms or your legs.  You have trouble talking.  You get sweaty or feel light-headed.  You pass out.  You have trouble breathing.  You have trouble staying awake.  You feel mixed up (confused). Summary  Hypotension is also called low blood pressure. It is when  the force of blood pumping through your arteries is too weak.  Hypotension may be harmless, or it may cause serious problems.  Treatment may include changing your diet and medicines, and wearing compression stockings.  In very bad cases, you may need to go to the hospital. This information is not intended to replace advice given to you by your health care provider. Make sure you discuss any questions you have with your health care provider. Document Revised: 04/09/2018 Document Reviewed: 04/09/2018 Elsevier Patient Education  2020 [employer].

## 2020-10-23 NOTE — Progress Notes (Signed)
Subjective: CC: vertigo PCP: Janora Norlander, DO [id number] Jenna Smith is Jenna 69 y.o. female presenting to clinic today for:  1. Vertigo Patient was seen earlier in the month by FNP he felt symptoms be related to BPPV.  She was appropriately referred to neuro rehab and has been undergoing Epley maneuver there.  Meclizine also recommended.  Unfortunately, she had had some worsening dizziness at last visit 10/19/20.  Medical history significant for intracranial hemorrhage in 2019 that was sustained after Jenna syncopal episode.   She notes that the vertigo is actually improving with the Epley maneuver with vestibular rehab.  However, she is been having intermittent lightheadedness.  She reports that this is predominantly when she changes from Jenna seated to standing position.  She admits that she is not hydrating as much during the winter as she does during the summer times.  She has had intermittent lower extremity edema but this has been trace.  No change in exercise tolerance.  No syncope.   ROS: Per HPI  Allergies  Allergen Reactions  . Bactrim [Sulfamethoxazole-Trimethoprim] Shortness Of Breath  . Prednisone     Pt states she thinks this was due to her taking prednisone with her pain medications-has taken prednisone since and didn't have any problems  . Indomethacin Swelling    Face swells up  . Bee Venom   . Latex Hives  . Neosporin [Neomycin-Bacitracin Zn-Polymyx] Hives  . Penicillins Hives    Has patient had Jenna PCN reaction causing immediate rash, facial/tongue/throat swelling, SOB or lightheadedness with hypotension: no Has patient had Jenna PCN reaction causing severe rash involving mucus membranes or skin necrosis: no Has patient had Jenna PCN reaction that required hospitalization: no Has patient had Jenna PCN reaction occurring within the last 10 years: no If all of the above answers are "NO", then may proceed with Cephalosporin use.    Past Medical History:  Diagnosis Date  .  Allergy   . Anemia 12/22/2017    Current Outpatient Medications:  .  Acetaminophen (TYLENOL ARTHRITIS PAIN PO), Take by mouth., Disp: , Rfl:  .  cyclobenzaprine (FLEXERIL) 10 MG tablet, cyclobenzaprine 10 mg tablet, Disp: , Rfl:  Social History   Socioeconomic History  . Marital status: Married    Spouse name: Richardson Landry  . Number of children: 1  . Years of education: 29  . Highest education level: Some college, no degree  Occupational History  . Occupation: retired  Tobacco Use  . Smoking status: Former Smoker    Packs/day: 0.30    Years: 15.00    Pack years: 4.50    Types: Cigarettes    Quit date: 10/28/1984    Years since quitting: 36.0  . Smokeless tobacco: Never Used  . Tobacco comment: smoked "off and on" x 15 yrs  Vaping Use  . Vaping Use: Never used  Substance and Sexual Activity  . Alcohol use: No    Alcohol/week: 0.0 standard drinks    Comment: Holidays - occasional wine  . Drug use: No  . Sexual activity: Yes    Birth control/protection: Post-menopausal  Other Topics Concern  . Not on file  Social History Narrative  . Not on file   Social Determinants of Health   Financial Resource Strain: Not on file  Food Insecurity: Not on file  Transportation Needs: Not on file  Physical Activity: Not on file  Stress: Not on file  Social Connections: Not on file  Intimate Partner Violence: Not on file   Family  History  Problem Relation Age of Onset  . Aneurysm Mother        passed away at age 52  . Cancer Father        passed away after Prostate cancer metastesize  . Cancer Sister        she passed away 28 due to Breast Cancer  . Stroke Sister   . Aneurysm Sister     Objective: Office vital signs reviewed. Temp (!) 97.4 F (36.3 C) (Temporal)   Ht 5\' 6"  (1.676 m)   Wt 185 lb (83.9 kg)   SpO2 99%   BMI 29.86 kg/m   Physical Examination:  General: Awake, alert, well nourished, No acute distress HEENT: Normal; sclera white.  PERRLA.  No carotid  bruits Cardio: regular rate and rhythm, S1S2 heard, no murmurs appreciated Pulm: clear to auscultation bilaterally, no wheezes, rhonchi or rales; normal work of breathing on room air Extremities: warm, well perfused, No edema, cyanosis or clubbing; +2 pulses bilaterally MSK: normal gait and station; left hand with minimal arthritic changes at the first MCP.  She has full active range of motion.  Mild reproduction of pain with Tinel's. Neuro: No focal neurologic deficits.  Cranial nerves II through XII grossly intact.  Light touch sensation grossly intact to all digits of the hands.  Orthostatic VS for the past 72 hrs (Last 3 readings):  Orthostatic BP Patient Position Orthostatic Pulse  10/23/20 1705 110/68 Standing --  10/23/20 1704 136/73 Supine --  10/23/20 1553 131/76 Sitting 80   Assessment/ Plan: 69 y.o. female   Orthostatic hypotension - Plan: Compression stockings  Vertigo  History of intracranial hemorrhage  Neuralgia - Plan: diclofenac Sodium (VOLTAREN) 1 % GEL  Suspect that the dizziness that she is experiencing is in fact orthostatic hypotension, particular given the 20 point difference between standing and sitting positions.  I have recommended compression hose, adequate hydration and slow transitions between positions.  Her vertiginous symptoms are improving with Epley maneuver with vestibular rehab.  No focal neurologic deficits to suggest intracranial etiology.  No red flags.  Neuralgia she is experiencing on the left hand is likely secondary to irritation of the nerve in the setting of arthritis.  I have given her sample of Voltaren gel.  Advised to use ice, wrist splint at nighttime.  We will follow as needed on this issue  No orders of the defined types were placed in this encounter.  No orders of the defined types were placed in this encounter.    81, DO [facility] [phone]

## 2020-10-26 ENCOUNTER — Encounter (HOSPITAL_COMMUNITY): Payer: Self-pay | Admitting: Physical Therapy

## 2020-10-26 ENCOUNTER — Ambulatory Visit (HOSPITAL_COMMUNITY): Payer: Medicare Other | Admitting: Physical Therapy

## 2020-10-26 ENCOUNTER — Other Ambulatory Visit: Payer: Self-pay

## 2020-10-26 DIAGNOSIS — M9903 Segmental and somatic dysfunction of lumbar region: Secondary | ICD-10-CM | POA: Diagnosis not present

## 2020-10-26 DIAGNOSIS — H8112 Benign paroxysmal vertigo, left ear: Secondary | ICD-10-CM

## 2020-10-26 DIAGNOSIS — M9901 Segmental and somatic dysfunction of cervical region: Secondary | ICD-10-CM | POA: Diagnosis not present

## 2020-10-26 DIAGNOSIS — M9902 Segmental and somatic dysfunction of thoracic region: Secondary | ICD-10-CM | POA: Diagnosis not present

## 2020-10-26 DIAGNOSIS — H8111 Benign paroxysmal vertigo, right ear: Secondary | ICD-10-CM | POA: Diagnosis not present

## 2020-10-26 DIAGNOSIS — R42 Dizziness and giddiness: Secondary | ICD-10-CM

## 2020-10-26 DIAGNOSIS — M62830 Muscle spasm of back: Secondary | ICD-10-CM | POA: Diagnosis not present

## 2020-10-26 NOTE — Therapy (Signed)
[facility] [address] [place], [place], [address] Phone: [phone]   Fax:  [phone]  Physical Therapy Treatment  Patient Details  Name: Jenna Smith MRN: [id number] Date of Birth: 11-04-1950 Referring Provider (PT): Marjorie Smolder  Progress Note Reporting Period 10/06/20 to 10/26/20  See note below for Objective Data and Assessment of Progress/Goals.       Encounter Date: 10/26/2020   PT End of Session - 10/26/20 1126    Visit Number 4    Number of Visits 8    Date for PT Re-Evaluation 11/24/20    Authorization Type UHC medicare    Authorization - Visit Number --    Authorization - Number of Visits --    Progress Note Due on Visit --    PT Start Time 1120    PT Stop Time 1215    PT Time Calculation (min) 55 min    Activity Tolerance Patient tolerated treatment well;Other (comment)   increased vertigo   Behavior During Therapy [facility] for tasks assessed/performed           Past Medical History:  Diagnosis Date   Allergy    Anemia 12/22/2017    Past Surgical History:  Procedure Laterality Date   BREAST SURGERY Bilateral May 2001   Breast reduction   COSMETIC SURGERY  September 2012   Face lift   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   Procedure: RIGHT TOTAL KNEE ARTHROPLASTY;  Surgeon: [name], MD;  Location: [facility];  Service: Orthopedics;  Laterality: Right;    There were no vitals filed for this visit.   Subjective Assessment - 10/26/20 1125    Subjective Patient says she had followed up with DO and was diagnosed with orthostatic hypotension. She says she is getting compression garments and staying hydrated. Says she has not had vertigo since last therapy appointment.    Pertinent History OA/ TKR    Limitations House hold activities    Currently in Pain? No/denies              [facility] PT Assessment - 10/26/20 0001      Assessment    Medical Diagnosis vertigo    Referring Provider (PT) Tiffany Lilia Pro    Onset Date/Surgical Date 09/27/20    Prior Therapy 2x this year      Precautions   Precautions None      Restrictions   Weight Bearing Restrictions No      Balance Screen   Has the patient fallen in the past 6 months No      [place] residence      Prior Function   Level of Independence Independent      Cognition   Overall Cognitive Status Within Functional Limits for tasks assessed      AROM   Cervical - Right Rotation 61   was 56   Cervical - Left Rotation 64   was 54                         Vestibular Treatment/Exercise - 10/26/20 0001      Vestibular Treatment/Exercise   Canalith Repositioning Epley Manuever Right;Epley Manuever Left       EPLEY MANUEVER RIGHT   Number of Reps  1    Overall Response Improved Symptoms    Response Details  LT torsional nystagmus for about 5  possible neuro evaluation, if deemed appropriate, and should continue physical therapy as needed for symptom management.    Personal Factors and Comorbidities Past/Current Experience    Examination-Activity Limitations Sleep    Examination-Participation Restrictions Yard Work    Stability/Clinical Decision Making Evolving/Moderate complexity    Rehab Potential Good    PT Frequency 1x / week    PT Duration 4 weeks    PT Treatment/Interventions Patient/family education;Therapeutic exercise;Other (comment);Manual techniques    PT Next Visit Plan Follow up about possible referral form PCP. Treat vertigo symptoms as indicated    PT Home Exercise Plan sheet on BPPV, self treatment, cervical ROM and eye exercises; 10/13/20: Habituation sidelying exercise    Consulted and Agree with Plan of Care Patient           Patient will benefit from skilled therapeutic intervention in order to improve the following deficits and impairments:  Decreased range of motion,Dizziness,Decreased balance  Visit Diagnosis: Dizziness and giddiness  BPPV (benign paroxysmal positional vertigo), left  BPPV (benign paroxysmal positional vertigo), right     Problem List Patient Active Problem List   Diagnosis Date Noted   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    Personal Factors and Comorbidities Past/Current Experience    Examination-Activity Limitations Sleep    Examination-Participation Restrictions Yard Work    Stability/Clinical Decision Making Evolving/Moderate complexity    Rehab Potential Good    PT Frequency 1x / week    PT Duration 4 weeks    PT Treatment/Interventions Patient/family education;Therapeutic exercise;Other (comment);Manual techniques    PT Next Visit Plan Follow up about possible referral form PCP. Treat vertigo symptoms as indicated    PT Home Exercise Plan sheet on BPPV, self treatment, cervical ROM and eye exercises; 10/13/20: Habituation sidelying exercise    Consulted and Agree with Plan of Care Patient           Patient will benefit from skilled therapeutic intervention in order to improve the following deficits and impairments:  Decreased range of motion,Dizziness,Decreased balance  Visit Diagnosis: Dizziness and giddiness  BPPV (benign paroxysmal positional vertigo), left  BPPV (benign paroxysmal positional vertigo), right     Problem List Patient Active Problem List   Diagnosis Date Noted   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

## 2020-10-30 DIAGNOSIS — M9901 Segmental and somatic dysfunction of cervical region: Secondary | ICD-10-CM | POA: Diagnosis not present

## 2020-10-30 DIAGNOSIS — M62830 Muscle spasm of back: Secondary | ICD-10-CM | POA: Diagnosis not present

## 2020-10-30 DIAGNOSIS — M9903 Segmental and somatic dysfunction of lumbar region: Secondary | ICD-10-CM | POA: Diagnosis not present

## 2020-10-30 DIAGNOSIS — M9902 Segmental and somatic dysfunction of thoracic region: Secondary | ICD-10-CM | POA: Diagnosis not present

## 2020-11-01 ENCOUNTER — Other Ambulatory Visit: Payer: Self-pay | Admitting: Family Medicine

## 2020-11-01 ENCOUNTER — Ambulatory Visit (HOSPITAL_COMMUNITY): Payer: Medicare Other | Attending: Family Medicine | Admitting: Physical Therapy

## 2020-11-01 ENCOUNTER — Encounter (HOSPITAL_COMMUNITY): Payer: Self-pay | Admitting: Physical Therapy

## 2020-11-01 ENCOUNTER — Other Ambulatory Visit: Payer: Self-pay

## 2020-11-01 ENCOUNTER — Telehealth: Payer: Self-pay

## 2020-11-01 DIAGNOSIS — H8111 Benign paroxysmal vertigo, right ear: Secondary | ICD-10-CM | POA: Diagnosis not present

## 2020-11-01 DIAGNOSIS — R42 Dizziness and giddiness: Secondary | ICD-10-CM | POA: Insufficient documentation

## 2020-11-01 DIAGNOSIS — H8112 Benign paroxysmal vertigo, left ear: Secondary | ICD-10-CM

## 2020-11-01 NOTE — Telephone Encounter (Signed)
Patient seen [facility] on 10/26/20 and today and they have sent notes to Tiffany with what they need patient to do from [place]. Jenna Smith is not here today and she wanted all to be discussed with you any way since your pcp. She was sent for dizziness and would like for you to review the Rehab notes and see what your thoughts are and what the next step should be.

## 2020-11-01 NOTE — Therapy (Signed)
Baseline No symptoms in past 7 days    Time 4    Period Weeks    Status Achieved      PT LONG TERM GOAL #2   Title PT will verbalize that she has tried and is comfortable with self repositioning techniques    Baseline Unable to perform without provocation of symptoms    Time 4    Period Weeks    Status On-going                 Plan - 11/01/20 1115    Clinical Impression Statement Patient reports no vertigo symptoms since last visit. Performed further assessments today based off findings last visit for suspected horizontal canal involvement. Patient tested x2 for supine head roll bilaterally and was negative both times. Reassessed ocular ROM, smooth pursuit and VOR, all appear intact at todays visit. Discussed findings with patient and that variability of symptom presentation may well warrant a follow up with referring provider. Patient instructed to follow up with therapy services as needed.    Personal Factors and Comorbidities Past/Current Experience    Examination-Activity Limitations Sleep    Examination-Participation Restrictions Yard Work    Stability/Clinical Decision Making Evolving/Moderate complexity    Rehab  Potential Good    PT Frequency 1x / week    PT Duration 4 weeks    PT Treatment/Interventions Patient/family education;Therapeutic exercise;Other (comment);Manual techniques    PT Next Visit Plan Follow up with referring provider. Return to therapy as needed per symptom presentation.    PT Home Exercise Plan sheet on BPPV, self treatment, cervical ROM and eye exercises; 10/13/20: Habituation sidelying exercise    Consulted and Agree with Plan of Care Patient           Patient will benefit from skilled therapeutic intervention in order to improve the following deficits and impairments:  Decreased range of motion,Dizziness,Decreased balance  Visit Diagnosis: Dizziness and giddiness  BPPV (benign paroxysmal positional vertigo), left  BPPV (benign paroxysmal positional vertigo), right     Problem List Patient Active Problem List   Diagnosis Date Noted  . 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 Syncope 12/22/2017  . Hypokalemia 12/22/2017  . Anemia 12/22/2017  . 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 Tendonitis of knee, right 06/11/2017  . Obesity (BMI 30-39.9) 06/21/2016  . Allergic rhinitis 02/26/2016  . Right knee pain 02/26/2016    11:17 AM, 11/01/20 Georges Lynch PT DPT  Physical Therapist with [place]  [facility]  [phone]   [facility] [address] [place], [place], [address] Phone: [phone]   Fax:  [phone]  Name: Jenna Smith MRN: [id number] Date of Birth: July 26, 1951  [facility] [address] [place], [place], [address] Phone: [phone]   Fax:  [phone]  Physical Therapy Treatment  Patient Details  Name: Jenna Smith MRN: [id number] Date of Birth: October 06, 1951 Referring Provider (PT): Harlow Mares   Encounter Date: 11/01/2020   PT End of Session - 11/01/20 1042    Visit Number 5    Number of Visits 8    Date for PT Re-Evaluation 11/24/20    Authorization Type UHC medicare    PT Start Time 1035    PT Stop Time 1110    PT Time Calculation (min) 35 min    Activity Tolerance Patient tolerated treatment well    Behavior During Therapy [facility] for tasks assessed/performed           Past Medical History:  Diagnosis Date  . Allergy   . Anemia 12/22/2017    Past Surgical History:  Procedure Laterality Date  . BREAST SURGERY Bilateral May 2001   Breast reduction  . COSMETIC SURGERY  September 2012   Face lift  . REDUCTION MAMMAPLASTY    . TONSILLECTOMY    . 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   Procedure: RIGHT TOTAL KNEE ARTHROPLASTY;  Surgeon: Ranee Gosselin, MD;  Location: [facility];  Service: Orthopedics;  Laterality: Right;    There were no vitals filed for this visit.   Subjective Assessment - 11/01/20 1042    Subjective Patient says she was dizzy for the remainder of the day after last visit, but says she felt fine by the following day. She says she has not had any issues since then, no dizzy spells or vertigo. She has been wearing her compression socks as well and says her light headedness has gone away. Notes she has had some recent headaches which she is unaccustomed to.    Pertinent History OA/ TKR    Limitations House hold activities    Currently in Pain? No/denies                   Vestibular Assessment - 11/01/20 0001      Oculomotor Exam   Smooth Pursuits Intact      Vestibulo-Ocular Reflex   VOR 1 Head Only (x 1 viewing) negative      Positional  Sensitivities   Positional Sensitivities Comments Supine head roll test bilaterally for horizontal canal implication (negative bilaterally)                              PT Short Term Goals - 10/26/20 1803      PT SHORT TERM GOAL #1   Title PT to be completing HEP to decrease sx of dizziness to no more than 2x a week    Baseline No vertigo in past 7 days    Time 2    Period Weeks    Status Achieved    Target Date 10/20/20      PT SHORT TERM GOAL #2   Title PT to be able to rotate cervical area 60 degrees B to improve safety of driving    Baseline See AROM    Time 2    Period Weeks    Status Achieved             PT Long Term Goals - 10/26/20 1804      PT LONG TERM GOAL #1   Title PT to report no dizziness for a week

## 2020-11-01 NOTE — Telephone Encounter (Signed)
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  Her 2019 MR/MRA was unremarkable but I can certainly recheck this and refer if needed.  Please inform pt that  MRI will be ordered.

## 2020-11-01 NOTE — Telephone Encounter (Signed)
Patient aware and verbalizes understanding. 

## 2020-11-01 NOTE — Telephone Encounter (Signed)
Lmtcb.

## 2020-11-07 ENCOUNTER — Ambulatory Visit (HOSPITAL_COMMUNITY): Payer: Medicare Other | Admitting: Physical Therapy

## 2020-11-08 ENCOUNTER — Encounter (HOSPITAL_COMMUNITY): Payer: Medicare Other | Admitting: Physical Therapy

## 2020-11-14 ENCOUNTER — Ambulatory Visit (HOSPITAL_COMMUNITY): Payer: Medicare Other

## 2020-11-15 ENCOUNTER — Ambulatory Visit (HOSPITAL_COMMUNITY)
Admission: RE | Admit: 2020-11-15 | Discharge: 2020-11-15 | Disposition: A | Discharge status: Home / Self Care | Payer: Medicare Other | Source: Ambulatory Visit | Attending: Family Medicine | Admitting: Family Medicine

## 2020-11-15 ENCOUNTER — Other Ambulatory Visit: Payer: Self-pay

## 2020-11-15 ENCOUNTER — Telehealth (HOSPITAL_COMMUNITY): Payer: Self-pay | Admitting: Physical Therapy

## 2020-11-15 DIAGNOSIS — R42 Dizziness and giddiness: Secondary | ICD-10-CM | POA: Diagnosis not present

## 2020-11-15 DIAGNOSIS — I6521 Occlusion and stenosis of right carotid artery: Secondary | ICD-10-CM | POA: Insufficient documentation

## 2020-11-15 NOTE — Telephone Encounter (Signed)
pt cancelled appt for 01/20 no reason given

## 2020-11-16 ENCOUNTER — Ambulatory Visit (HOSPITAL_COMMUNITY): Payer: Medicare Other | Admitting: Physical Therapy

## 2020-12-18 DIAGNOSIS — M9903 Segmental and somatic dysfunction of lumbar region: Secondary | ICD-10-CM | POA: Diagnosis not present

## 2020-12-18 DIAGNOSIS — M9902 Segmental and somatic dysfunction of thoracic region: Secondary | ICD-10-CM | POA: Diagnosis not present

## 2020-12-18 DIAGNOSIS — M62830 Muscle spasm of back: Secondary | ICD-10-CM | POA: Diagnosis not present

## 2020-12-18 DIAGNOSIS — M9901 Segmental and somatic dysfunction of cervical region: Secondary | ICD-10-CM | POA: Diagnosis not present

## 2020-12-27 ENCOUNTER — Other Ambulatory Visit: Payer: Self-pay

## 2020-12-27 ENCOUNTER — Encounter: Payer: Self-pay | Admitting: Family Medicine

## 2020-12-27 ENCOUNTER — Ambulatory Visit (INDEPENDENT_AMBULATORY_CARE_PROVIDER_SITE_OTHER): Payer: Medicare Other | Admitting: Family Medicine

## 2020-12-27 VITALS — BP 121/67 | HR 76 | Temp 97.9°F | Ht 66.0 in | Wt 188.0 lb

## 2020-12-27 DIAGNOSIS — M25512 Pain in left shoulder: Secondary | ICD-10-CM | POA: Diagnosis not present

## 2020-12-27 DIAGNOSIS — M5030 Other cervical disc degeneration, unspecified cervical region: Secondary | ICD-10-CM | POA: Diagnosis not present

## 2020-12-27 DIAGNOSIS — M542 Cervicalgia: Secondary | ICD-10-CM | POA: Diagnosis not present

## 2020-12-27 DIAGNOSIS — G8929 Other chronic pain: Secondary | ICD-10-CM

## 2020-12-27 DIAGNOSIS — I951 Orthostatic hypotension: Secondary | ICD-10-CM

## 2020-12-27 DIAGNOSIS — R1013 Epigastric pain: Secondary | ICD-10-CM | POA: Diagnosis not present

## 2020-12-27 NOTE — Progress Notes (Signed)
Subjective: CC: Shoulder and neck pain PCP: Janora Norlander, DO [id number] Jenna Smith is Jenna 70 y.o. female presenting to clinic today for:  1.  Shoulder and neck pain Patient with chronic bilateral shoulder pain, left greater than right.  She tried getting an appoint with her orthopedist several times unfortunately this has been canceled x3 due to various issues on his side.  She has known degenerative changes within the C-spine.  She is taking an over-the-counter supplement to help with pain and is discontinued scheduled Tylenol and topical diclofenac.  She does not do well with corticosteroid injections.  She has seen Dr. Trenton Gammon outpatient for intraparenchymal hemorrhage of the brain in the past.  She does report difficulty raising her left upper extremity due to pain.  She denies any sensory changes or electric sensation down the arms.  2.  Abdominal pain Patient reports abdominal pain around the navel.  She feels that she might have Jenna stress ulcer.  Denies use of NSAIDs or consumption of very acidic foods.  She is not on any acid reflux medications.  She does not have Jenna gastroenterologist.  Denies any hematochezia, melena, nausea or vomiting.  No unplanned weight loss.   ROS: Per HPI  Allergies  Allergen Reactions  . Bactrim [Sulfamethoxazole-Trimethoprim] Shortness Of Breath  . Prednisone     Pt states she thinks this was due to her taking prednisone with her pain medications-has taken prednisone since and didn't have any problems  . Indomethacin Swelling    Face swells up  . Bee Venom   . Latex Hives  . Neosporin [Neomycin-Bacitracin Zn-Polymyx] Hives  . Penicillins Hives    Has patient had Jenna PCN reaction causing immediate rash, facial/tongue/throat swelling, SOB or lightheadedness with hypotension: no Has patient had Jenna PCN reaction causing severe rash involving mucus membranes or skin necrosis: no Has patient had Jenna PCN reaction that required hospitalization: no Has patient  had Jenna PCN reaction occurring within the last 10 years: no If all of the above answers are "NO", then may proceed with Cephalosporin use.    Past Medical History:  Diagnosis Date  . Allergy   . Anemia 12/22/2017   No current outpatient medications on file. Social History   Socioeconomic History  . Marital status: Married    Spouse name: Richardson Landry  . Number of children: 1  . Years of education: 73  . Highest education level: Some college, no degree  Occupational History  . Occupation: retired  Tobacco Use  . Smoking status: Former Smoker    Packs/day: 0.30    Years: 15.00    Pack years: 4.50    Types: Cigarettes    Quit date: 10/28/1984    Years since quitting: 36.1  . Smokeless tobacco: Never Used  . Tobacco comment: smoked "off and on" x 15 yrs  Vaping Use  . Vaping Use: Never used  Substance and Sexual Activity  . Alcohol use: No    Alcohol/week: 0.0 standard drinks    Comment: Holidays - occasional wine  . Drug use: No  . Sexual activity: Yes    Birth control/protection: Post-menopausal  Other Topics Concern  . Not on file  Social History Narrative  . Not on file   Social Determinants of Health   Financial Resource Strain: Not on file  Food Insecurity: Not on file  Transportation Needs: Not on file  Physical Activity: Not on file  Stress: Not on file  Social Connections: Not on file  Intimate Partner  Violence: Not on file   Family History  Problem Relation Age of Onset  . Aneurysm Mother        passed away at age 44  . Cancer Father        passed away after Prostate cancer metastesize  . Cancer Sister        she passed away 49 due to Breast Cancer  . Stroke Sister   . Aneurysm Sister     Objective: Office vital signs reviewed. BP 121/67   Pulse 76   Temp 97.9 F (36.6 C) (Temporal)   Ht 5\' 6"  (1.676 m)   Wt 188 lb (85.3 kg)   SpO2 98%   BMI 30.34 kg/m   Physical Examination:  General: Awake, alert, well nourished, No acute distress HEENT:  Normal, sclera white Cardio: regular rate  Pulm:   normal work of breathing on room air GI: soft, epigastric TTP present. non-distended, bowel sounds present x4, no hepatomegaly, no splenomegaly, no masses MSK:    Left shoulder: Limited active range of motion to only about 75 degrees in flexion and abduction.  I am able to passively move her left upper extremity without any reproduction of pain.  There is no tenderness to palpation to the shoulder.  She does have increased tonicity of the left trapezius  C-spine: Active range of motion is preserved.  No midline tenderness palpation. Neuro: 5/5 UE Strength and light touch sensation grossly intact   Assessment/ Plan: 70 y.o. female   Epigastric pain - Plan: Ambulatory referral to Gastroenterology  Orthostatic hypotension  DDD (degenerative disc disease), cervical - Plan: MR Cervical Spine Wo Contrast  Neck pain - Plan: MR Cervical Spine Wo Contrast  Chronic left shoulder pain - Plan: MR Cervical Spine Wo Contrast  Advised PPI.  Instructions for use discussed.  Referral to gastroenterology placed.  Rule out ulcer  Orthostatic hypotension improved with use of compression hose.  She has had no recurrent issues when wearing these  She certainly seems like she is having radiation into the shoulders from degenerative disc disease.  She has had degenerative changes noted on x-rays before so we will proceed with MRI spine.  If MRI shows concerning findings, plan for referral to Dr. Trenton Gammon, whom she is seen in the past.  I suspect that the left shoulder pain is radiation from the C-spine as she had no evidence of rotator cuff tear today or frozen shoulder.  No orders of the defined types were placed in this encounter.  No orders of the defined types were placed in this encounter.    Janora Norlander, DO [facility] [phone]

## 2020-12-27 NOTE — Patient Instructions (Signed)
Omeprazole (Prilosec OTC 20mg ) Take 1 daily for 2 weeks.  Referral to GI placed MRI ordered.

## 2021-01-01 ENCOUNTER — Encounter: Payer: Self-pay | Admitting: Internal Medicine

## 2021-01-09 ENCOUNTER — Other Ambulatory Visit: Payer: Self-pay

## 2021-01-09 ENCOUNTER — Ambulatory Visit (HOSPITAL_COMMUNITY)
Admission: RE | Admit: 2021-01-09 | Discharge: 2021-01-09 | Disposition: A | Discharge status: Home / Self Care | Payer: Medicare Other | Source: Ambulatory Visit | Attending: Family Medicine | Admitting: Family Medicine

## 2021-01-09 ENCOUNTER — Other Ambulatory Visit: Payer: Self-pay | Admitting: Family Medicine

## 2021-01-09 DIAGNOSIS — M542 Cervicalgia: Secondary | ICD-10-CM | POA: Diagnosis not present

## 2021-01-09 DIAGNOSIS — G8929 Other chronic pain: Secondary | ICD-10-CM

## 2021-01-09 DIAGNOSIS — M5030 Other cervical disc degeneration, unspecified cervical region: Secondary | ICD-10-CM | POA: Diagnosis not present

## 2021-01-09 DIAGNOSIS — M25512 Pain in left shoulder: Secondary | ICD-10-CM | POA: Diagnosis not present

## 2021-01-30 DIAGNOSIS — S069X0D Unspecified intracranial injury without loss of consciousness, subsequent encounter: Secondary | ICD-10-CM | POA: Diagnosis not present

## 2021-01-31 ENCOUNTER — Ambulatory Visit: Payer: Medicare Other | Admitting: Nurse Practitioner

## 2021-02-15 DIAGNOSIS — H2513 Age-related nuclear cataract, bilateral: Secondary | ICD-10-CM | POA: Diagnosis not present

## 2021-02-15 DIAGNOSIS — H40033 Anatomical narrow angle, bilateral: Secondary | ICD-10-CM | POA: Diagnosis not present

## 2021-02-19 DIAGNOSIS — M9902 Segmental and somatic dysfunction of thoracic region: Secondary | ICD-10-CM | POA: Diagnosis not present

## 2021-02-19 DIAGNOSIS — M9901 Segmental and somatic dysfunction of cervical region: Secondary | ICD-10-CM | POA: Diagnosis not present

## 2021-02-19 DIAGNOSIS — M62830 Muscle spasm of back: Secondary | ICD-10-CM | POA: Diagnosis not present

## 2021-02-19 DIAGNOSIS — M9903 Segmental and somatic dysfunction of lumbar region: Secondary | ICD-10-CM | POA: Diagnosis not present

## 2021-03-01 DIAGNOSIS — M25512 Pain in left shoulder: Secondary | ICD-10-CM | POA: Diagnosis not present

## 2021-03-01 DIAGNOSIS — Z96651 Presence of right artificial knee joint: Secondary | ICD-10-CM | POA: Diagnosis not present

## 2021-03-01 DIAGNOSIS — M25511 Pain in right shoulder: Secondary | ICD-10-CM | POA: Diagnosis not present

## 2021-05-05 NOTE — Progress Notes (Signed)
Referring Provider:Gottschalk, Koleen Distance, DO Primary Care Physician:  Janora Norlander, DO Primary Gastroenterologist:  Dr. Abbey Chatters  Chief Complaint  Patient presents with   Abdominal Cramping   Abdominal Pain    Epigastric    Diarrhea    Usually after eating and worse in mornings     HPI:   Jenna Smith is a 70 y.o. female presenting today at the request of Gottschalk, Ashly M, DO for epigastric pain.  Epigastric pain: Intermittent. Last occurred about 1-2 weeks ago. First started a couple months ago. Wonders about stress. Family. Increased lately. She wanted to move back to [place], leave her marriage and children. This has calmed down. sharp. When it first started it was at night for about 5 minutes. Was occurring a few times a week. Now about 1 minute. Not at night any more. Hasn't noticed after a meal. Gallbladder in situ.   No GERD, dysphagia, nausea, vomiting. Only taking tylenol as needed.  Hasn't taken anything for this. No radiation.   Diarrhea and cramping:  Eating spinach, eggs, mushrooms, broccoli, salmon. Has allergies to these items. She didn't realize she was allergic to these items. Can get hives from black pepper and avocado. Gets little bumps. Hasn't seen allergist since 2009. No prior nutritional consult.   Present for years. Diagnosed with IBS years ago. Stools are in the morning. Drinks 3 cups of coffee in the morning. 2-3 Bms. Stools are mushy. No watery stools. No blood in the stool. No black stools.  Intermittent abdominal cramping prior to BMs that resolve thereafter. No change.  Not bothered by her symptoms.  States if it is the foods, she is just going to live with it. Not interested in changing her diet.   Antibiotics: None.  Sick contacts: None.  Last colonoscopy was at age 35.  Reports she also had an EGD done at that time that was also normal.  States she was not having any significant symptoms, they just went ahead and did both  procedures.  Weight is stable.  Very active.   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     Calcium Carbonate-Vit D-Min (CALCIUM 1200 PO) Take by mouth daily.     Coenzyme Q10 (CO Q 10) 10 MG CAPS Take by mouth daily. Co Q 10 mega 3 + Turmeric     OVER THE COUNTER MEDICATION Relief factor takes 3-4 times daily. Zinc 100mg  Vitamin C 1000mg  Vitamin d3 1000 IU daily Vitamin D + K 10000 IU daily Magnesium 375 Potassium 450 Biotin  daily Hair skin Nails 2516mcg daily     No current facility-administered medications for this visit.    Allergies as of 05/07/2021 - Review Complete 05/07/2021  Allergen Reaction Noted   Bactrim [sulfamethoxazole-trimethoprim] Shortness Of Breath 10/04/2020  Prednisone  03/04/2018   Indomethacin Swelling 07/01/2017   Avocado Hives 05/07/2021   Bee venom  04/09/2019   Latex Hives 02/26/2016   Neosporin [neomycin-bacitracin zn-polymyx] Hives 02/26/2016   Other  05/07/2021   Penicillins Hives 02/26/2016   Pomegranate (punica granatum) Hives 05/07/2021    Family History  Problem Relation Age of Onset   Aneurysm Mother        passed away at age 34   Cancer Father        passed away after Prostate cancer metastesize   Cancer Sister        she passed away 56 due to Breast Cancer   Stroke Sister    Aneurysm Sister     Colon cancer Neg Hx     Social History   Socioeconomic History   Marital status: Married    Spouse name: Richardson Landry   Number of children: 1   Years of education: 69   Highest education level: Some college, no degree  Occupational History   Occupation: retired  Tobacco Use   Smoking status: Former    Packs/day: 0.30    Years: 15.00    Pack years: 4.50    Types: Cigarettes    Quit date: 10/28/1984    Years since quitting: 36.5   Smokeless tobacco: Never   Tobacco comments:    smoked "off and on" x 15 yrs  Vaping Use   Vaping Use: Never used  Substance and Sexual Activity   Alcohol use: Not Currently    Comment: Holidays - occasional wine   Drug use: No   Sexual activity: Yes    Birth control/protection: Post-menopausal  Other Topics Concern   Not on file  Social History Narrative   Not on file   Social Determinants of Health   Financial Resource Strain: Not on file  Food Insecurity: Not on file  Transportation Needs: Not on file  Physical Activity: Not on file  Stress: Not on file  Social Connections: Not on file  Intimate Partner Violence: Not on file    Review of Systems: Gen: Denies any fever, chills, cold or flulike symptoms, lightheadedness, dizziness, presyncope, syncope. CV: Denies chest pain or palpitations. Resp: Denies shortness of breath or cough. GI: See HPI GU : Denies urinary burning, urinary frequency, urinary hesitancy MS: Denies joint pain Derm: Denies rash Psych: Denies depression or anxiety Heme: See HPI  Physical Exam: BP 112/68   Pulse 69   Temp (!) 97.5 F (36.4 C) (Temporal)   Ht 5\' 6"  (1.676 m)   Wt 191 lb 12.8 oz (87 kg)   BMI 30.96 kg/m  General:   Alert and oriented. Pleasant and cooperative. Well-nourished and well-developed.  Head:  Normocephalic and atraumatic. Eyes:  Without icterus, sclera clear and conjunctiva pink.  Ears:  Normal auditory acuity. Lungs:  Clear to auscultation bilaterally. No wheezes, rales, or  rhonchi. No distress.  Heart:  S1, S2 present without murmurs appreciated.  Abdomen:  +BS, soft, non-tender and non-distended. No HSM noted. No guarding or rebound. No masses appreciated.  Rectal:  Deferred  Msk:  Symmetrical without gross deformities. Normal posture. Extremities:  Without edema. Neurologic:  Alert and  oriented x4;  grossly normal neurologically. Skin:  Intact without significant lesions or rashes. Psych:  Normal mood and affect.    Assessment: 70 year old female with history of multiple food allergies, but with dietary indiscretion presenting today at the request of Dr. Lajuana Ripple for further evaluation of epigastric pain.  Also discussed chronic loose  stools and need for colon cancer screening.    Epigastric abdominal pain: New onset intermittent short-lived epigastric abdominal pain a couple months ago in the setting of significant stress. Clinically, symptoms are much improved/almost resolved with last occurrence a couple weeks ago.  Aside from addressing the stressors in her life, she has not made any other changes.  Denies reflux, dysphagia, nausea, vomiting, BRBPR, melena, unintentional weight loss. Not on a PPI.  Query whether symptoms may have been secondary to significant stress.  Additional differentials include gastritis, H. pylori, PUD.  After further discussion with the patient, we will hold off on EGD for now she is doing very well.  We will go ahead and screen her for H. pylori.  Advised if she has recurrent epigastric pain, she should have EGD.  She is agreeable to this.    Diarrhea: Chronic loose stools in the morning with associated abdominal cramping prior to bowel movements that improves thereafter.  No alarm symptoms.  Reports chronic history of IBS with no change in symptoms over the last several years.  I also suspect dietary indiscretion in the setting of multiple food allergies is also contributing.  She is not bothered by her symptoms and not interested in  medications at this time.  Additionally, she tells me if it is the food that is causing her symptoms, she will just have to deal with it.  She is not interested in changing her diet.  In light of multiple food allergies, I did recommend screening for celiac disease.  Otherwise, continue to monitor.  Colon cancer screening: Patient reports last colonoscopy was at age 63 with no colon polyps.  She has no alarm symptoms.  We will arrange for her to have a colonoscopy in the near future.   Plan: 1.  Proceed with colonoscopy for colon cancer screening with propofol with Dr. Abbey Chatters.  Possible EGD at the time of colonoscopy pending clinical course of epigastric abdominal pain.  Patient is agreeable to EGD if she continues with intermittent epigastric pain.  The risks, benefits, and alternatives have been discussed with the patient in detail. The patient states understanding and desires to proceed.  ASA II  2.  H. pylori breath test.  3.  TTG IgA, IgA total.  4.   Requested she let me know if she is interested in seeing a dietician due to multiple food allergies.  5.  Follow-up after procedures.    Aliene Altes, PA-C [facility] 05/07/2021

## 2021-05-05 NOTE — H&P (View-Only) (Signed)
Referring Provider:Gottschalk, Koleen Distance, DO Primary Care Physician:  Janora Norlander, DO Primary Gastroenterologist:  Dr. Abbey Chatters  Chief Complaint  Patient presents with   Abdominal Cramping   Abdominal Pain    Epigastric    Diarrhea    Usually after eating and worse in mornings     HPI:   Jenna Smith is a 69 y.o. female presenting today at the request of Gottschalk, Ashly M, DO for epigastric pain.  Epigastric pain: Intermittent. Last occurred about 1-2 weeks ago. First started a couple months ago. Wonders about stress. Family. Increased lately. She wanted to move back to [place], leave her marriage and children. This has calmed down. sharp. When it first started it was at night for about 5 minutes. Was occurring a few times a week. Now about 1 minute. Not at night any more. Hasn't noticed after a meal. Gallbladder in situ.   No GERD, dysphagia, nausea, vomiting. Only taking tylenol as needed.  Hasn't taken anything for this. No radiation.   Diarrhea and cramping:  Eating spinach, eggs, mushrooms, broccoli, salmon. Has allergies to these items. She didn't realize she was allergic to these items. Can get hives from black pepper and avocado. Gets little bumps. Hasn't seen allergist since 2009. No prior nutritional consult.   Present for years. Diagnosed with IBS years ago. Stools are in the morning. Drinks 3 cups of coffee in the morning. 2-3 Bms. Stools are mushy. No watery stools. No blood in the stool. No black stools.  Intermittent abdominal cramping prior to BMs that resolve thereafter. No change.  Not bothered by her symptoms.  States if it is the foods, she is just going to live with it. Not interested in changing her diet.   Antibiotics: None.  Sick contacts: None.  Last colonoscopy was at age 56.  Reports she also had an EGD done at that time that was also normal.  States she was not having any significant symptoms, they just went ahead and did both  procedures.  Weight is stable.  Very active.   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     Calcium Carbonate-Vit D-Min (CALCIUM 1200 PO) Take by mouth daily.     Coenzyme Q10 (CO Q 10) 10 MG CAPS Take by mouth daily. Co Q 10 mega 3 + Turmeric     OVER THE COUNTER MEDICATION Relief factor takes 3-4 times daily. Zinc 100mg  Vitamin C 1000mg  Vitamin d3 1000 IU daily Vitamin D + K 10000 IU daily Magnesium 375 Potassium 450 Biotin  daily Hair skin Nails 2566mcg daily     No current facility-administered medications for this visit.    Allergies as of 05/07/2021 - Review Complete 05/07/2021  Allergen Reaction Noted   Bactrim [sulfamethoxazole-trimethoprim] Shortness Of Breath 10/04/2020  Prednisone  03/04/2018   Indomethacin Swelling 07/01/2017   Avocado Hives 05/07/2021   Bee venom  04/09/2019   Latex Hives 02/26/2016   Neosporin [neomycin-bacitracin zn-polymyx] Hives 02/26/2016   Other  05/07/2021   Penicillins Hives 02/26/2016   Pomegranate (punica granatum) Hives 05/07/2021    Family History  Problem Relation Age of Onset   Aneurysm Mother        passed away at age 39   Cancer Father        passed away after Prostate cancer metastesize   Cancer Sister        she passed away 30 due to Breast Cancer   Stroke Sister    Aneurysm Sister     Colon cancer Neg Hx     Social History   Socioeconomic History   Marital status: Married    Spouse name: Richardson Landry   Number of children: 1   Years of education: 77   Highest education level: Some college, no degree  Occupational History   Occupation: retired  Tobacco Use   Smoking status: Former    Packs/day: 0.30    Years: 15.00    Pack years: 4.50    Types: Cigarettes    Quit date: 10/28/1984    Years since quitting: 36.5   Smokeless tobacco: Never   Tobacco comments:    smoked "off and on" x 15 yrs  Vaping Use   Vaping Use: Never used  Substance and Sexual Activity   Alcohol use: Not Currently    Comment: Holidays - occasional wine   Drug use: No   Sexual activity: Yes    Birth control/protection: Post-menopausal  Other Topics Concern   Not on file  Social History Narrative   Not on file   Social Determinants of Health   Financial Resource Strain: Not on file  Food Insecurity: Not on file  Transportation Needs: Not on file  Physical Activity: Not on file  Stress: Not on file  Social Connections: Not on file  Intimate Partner Violence: Not on file    Review of Systems: Gen: Denies any fever, chills, cold or flulike symptoms, lightheadedness, dizziness, presyncope, syncope. CV: Denies chest pain or palpitations. Resp: Denies shortness of breath or cough. GI: See HPI GU : Denies urinary burning, urinary frequency, urinary hesitancy MS: Denies joint pain Derm: Denies rash Psych: Denies depression or anxiety Heme: See HPI  Physical Exam: BP 112/68   Pulse 69   Temp (!) 97.5 F (36.4 C) (Temporal)   Ht 5\' 6"  (1.676 m)   Wt 191 lb 12.8 oz (87 kg)   BMI 30.96 kg/m  General:   Alert and oriented. Pleasant and cooperative. Well-nourished and well-developed.  Head:  Normocephalic and atraumatic. Eyes:  Without icterus, sclera clear and conjunctiva pink.  Ears:  Normal auditory acuity. Lungs:  Clear to auscultation bilaterally. No wheezes, rales, or  rhonchi. No distress.  Heart:  S1, S2 present without murmurs appreciated.  Abdomen:  +BS, soft, non-tender and non-distended. No HSM noted. No guarding or rebound. No masses appreciated.  Rectal:  Deferred  Msk:  Symmetrical without gross deformities. Normal posture. Extremities:  Without edema. Neurologic:  Alert and  oriented x4;  grossly normal neurologically. Skin:  Intact without significant lesions or rashes. Psych:  Normal mood and affect.    Assessment: 70 year old female with history of multiple food allergies, but with dietary indiscretion presenting today at the request of Dr. Lajuana Ripple for further evaluation of epigastric pain.  Also discussed chronic loose  stools and need for colon cancer screening.    Epigastric abdominal pain: New onset intermittent short-lived epigastric abdominal pain a couple months ago in the setting of significant stress. Clinically, symptoms are much improved/almost resolved with last occurrence a couple weeks ago.  Aside from addressing the stressors in her life, she has not made any other changes.  Denies reflux, dysphagia, nausea, vomiting, BRBPR, melena, unintentional weight loss. Not on a PPI.  Query whether symptoms may have been secondary to significant stress.  Additional differentials include gastritis, H. pylori, PUD.  After further discussion with the patient, we will hold off on EGD for now she is doing very well.  We will go ahead and screen her for H. pylori.  Advised if she has recurrent epigastric pain, she should have EGD.  She is agreeable to this.    Diarrhea: Chronic loose stools in the morning with associated abdominal cramping prior to bowel movements that improves thereafter.  No alarm symptoms.  Reports chronic history of IBS with no change in symptoms over the last several years.  I also suspect dietary indiscretion in the setting of multiple food allergies is also contributing.  She is not bothered by her symptoms and not interested in  medications at this time.  Additionally, she tells me if it is the food that is causing her symptoms, she will just have to deal with it.  She is not interested in changing her diet.  In light of multiple food allergies, I did recommend screening for celiac disease.  Otherwise, continue to monitor.  Colon cancer screening: Patient reports last colonoscopy was at age 17 with no colon polyps.  She has no alarm symptoms.  We will arrange for her to have a colonoscopy in the near future.   Plan: 1.  Proceed with colonoscopy for colon cancer screening with propofol with Dr. Abbey Chatters.  Possible EGD at the time of colonoscopy pending clinical course of epigastric abdominal pain.  Patient is agreeable to EGD if she continues with intermittent epigastric pain.  The risks, benefits, and alternatives have been discussed with the patient in detail. The patient states understanding and desires to proceed.  ASA II  2.  H. pylori breath test.  3.  TTG IgA, IgA total.  4.   Requested she let me know if she is interested in seeing a dietician due to multiple food allergies.  5.  Follow-up after procedures.    Aliene Altes, PA-C [facility]enter[facility] 05/07/2021

## 2021-05-07 ENCOUNTER — Other Ambulatory Visit: Payer: Self-pay

## 2021-05-07 ENCOUNTER — Telehealth: Payer: Self-pay

## 2021-05-07 ENCOUNTER — Encounter: Payer: Self-pay | Admitting: Gastroenterology

## 2021-05-07 ENCOUNTER — Ambulatory Visit (INDEPENDENT_AMBULATORY_CARE_PROVIDER_SITE_OTHER): Payer: Medicare Other | Admitting: Gastroenterology

## 2021-05-07 VITALS — BP 112/68 | HR 69 | Temp 97.5°F | Ht 66.0 in | Wt 191.8 lb

## 2021-05-07 DIAGNOSIS — Z1211 Encounter for screening for malignant neoplasm of colon: Secondary | ICD-10-CM | POA: Diagnosis not present

## 2021-05-07 DIAGNOSIS — R197 Diarrhea, unspecified: Secondary | ICD-10-CM

## 2021-05-07 DIAGNOSIS — R1013 Epigastric pain: Secondary | ICD-10-CM | POA: Diagnosis not present

## 2021-05-07 MED ORDER — PEG 3350-KCL-NA BICARB-NACL 420 G PO SOLR: 4000.0000 mL | ORAL | 0 refills | Status: DC

## 2021-05-07 NOTE — Patient Instructions (Signed)
We will arrange for you to have a colonoscopy and possible upper endoscopy in the near future with Dr. Abbey Chatters.  Please go to [facility] lab to have blood work completed to screen for celiac disease and breath test to check for bacteria called H. pylori.  As we discussed, some of her symptoms may very well be secondary to multiple food allergies.  Would recommend you try to limit these foods as much as possible.  We can refer you to a dietitian if you would like.  Please let me know if you would like a referral.  We will plan to see back in the office after your procedures.  Please let me know if you have any worsening symptoms or questions or concerns prior to your next visit.  It was great meeting you today!  Aliene Altes, PA-C [facility]

## 2021-05-07 NOTE — Telephone Encounter (Signed)
PA for TCS/-/+EGD submitted via [facility] website. PA# [id number], valid 05/29/21-08/27/21.

## 2021-05-08 LAB — H. PYLORI BREATH TEST: H. pylori Breath Test: NOT DETECTED

## 2021-05-08 LAB — IGA: Immunoglobulin A: 241 mg/dL (ref 70–320)

## 2021-05-08 LAB — TISSUE TRANSGLUTAMINASE, IGA: (tTG) Ab, IgA: <1 U/mL

## 2021-05-21 DIAGNOSIS — M9901 Segmental and somatic dysfunction of cervical region: Secondary | ICD-10-CM | POA: Diagnosis not present

## 2021-05-21 DIAGNOSIS — M62830 Muscle spasm of back: Secondary | ICD-10-CM | POA: Diagnosis not present

## 2021-05-21 DIAGNOSIS — M9903 Segmental and somatic dysfunction of lumbar region: Secondary | ICD-10-CM | POA: Diagnosis not present

## 2021-05-21 DIAGNOSIS — M9902 Segmental and somatic dysfunction of thoracic region: Secondary | ICD-10-CM | POA: Diagnosis not present

## 2021-05-23 ENCOUNTER — Telehealth: Payer: Self-pay | Admitting: Internal Medicine

## 2021-05-23 NOTE — Telephone Encounter (Signed)
Noted. PA was also done for EGD.

## 2021-05-23 NOTE — Telephone Encounter (Signed)
Spoke with patient.  Reports she continues to have intermittent abdominal pain and would like to proceed with EGD at the same time as her colonoscopy.    Sending FYI to Dr. Abbey Chatters as orders have been placed for colonoscopy and possible EGD pending clinical course of abdominal pain. Patient will need EGD.

## 2021-05-23 NOTE — Telephone Encounter (Signed)
Pt is scheduled with Dr Abbey Chatters on 8/2 and has questions about the procedure. She asked to speak with Aliene Altes, [place]. [phone]

## 2021-05-29 ENCOUNTER — Ambulatory Visit (HOSPITAL_COMMUNITY): Payer: Medicare Other | Admitting: Anesthesiology

## 2021-05-29 ENCOUNTER — Other Ambulatory Visit: Payer: Self-pay

## 2021-05-29 ENCOUNTER — Encounter (HOSPITAL_COMMUNITY)
Admission: RE | Disposition: A | Discharge status: Home / Self Care | Payer: Self-pay | Source: Home / Self Care | Attending: Internal Medicine

## 2021-05-29 ENCOUNTER — Encounter: Payer: Self-pay | Admitting: Internal Medicine

## 2021-05-29 ENCOUNTER — Encounter (HOSPITAL_COMMUNITY): Payer: Self-pay

## 2021-05-29 ENCOUNTER — Ambulatory Visit (HOSPITAL_COMMUNITY)
Admission: RE | Admit: 2021-05-29 | Discharge: 2021-05-29 | Disposition: A | Discharge status: Home / Self Care | Payer: Medicare Other | Attending: Internal Medicine | Admitting: Internal Medicine

## 2021-05-29 DIAGNOSIS — K319 Disease of stomach and duodenum, unspecified: Secondary | ICD-10-CM | POA: Insufficient documentation

## 2021-05-29 DIAGNOSIS — Z882 Allergy status to sulfonamides status: Secondary | ICD-10-CM | POA: Diagnosis not present

## 2021-05-29 DIAGNOSIS — Z880 Allergy status to penicillin: Secondary | ICD-10-CM | POA: Diagnosis not present

## 2021-05-29 DIAGNOSIS — K635 Polyp of colon: Secondary | ICD-10-CM | POA: Diagnosis not present

## 2021-05-29 DIAGNOSIS — Z881 Allergy status to other antibiotic agents status: Secondary | ICD-10-CM | POA: Insufficient documentation

## 2021-05-29 DIAGNOSIS — K3189 Other diseases of stomach and duodenum: Secondary | ICD-10-CM | POA: Diagnosis not present

## 2021-05-29 DIAGNOSIS — K648 Other hemorrhoids: Secondary | ICD-10-CM | POA: Diagnosis not present

## 2021-05-29 DIAGNOSIS — Z888 Allergy status to other drugs, medicaments and biological substances status: Secondary | ICD-10-CM | POA: Insufficient documentation

## 2021-05-29 DIAGNOSIS — Z87891 Personal history of nicotine dependence: Secondary | ICD-10-CM | POA: Diagnosis not present

## 2021-05-29 DIAGNOSIS — D649 Anemia, unspecified: Secondary | ICD-10-CM | POA: Diagnosis not present

## 2021-05-29 DIAGNOSIS — R1013 Epigastric pain: Secondary | ICD-10-CM | POA: Diagnosis not present

## 2021-05-29 DIAGNOSIS — R140 Abdominal distension (gaseous): Secondary | ICD-10-CM | POA: Diagnosis not present

## 2021-05-29 DIAGNOSIS — K2970 Gastritis, unspecified, without bleeding: Secondary | ICD-10-CM | POA: Diagnosis not present

## 2021-05-29 DIAGNOSIS — Z91040 Latex allergy status: Secondary | ICD-10-CM | POA: Insufficient documentation

## 2021-05-29 DIAGNOSIS — D120 Benign neoplasm of cecum: Secondary | ICD-10-CM | POA: Diagnosis not present

## 2021-05-29 DIAGNOSIS — Z1211 Encounter for screening for malignant neoplasm of colon: Secondary | ICD-10-CM | POA: Insufficient documentation

## 2021-05-29 HISTORY — PX: ESOPHAGOGASTRODUODENOSCOPY (EGD) WITH PROPOFOL: SHX5813

## 2021-05-29 HISTORY — PX: COLONOSCOPY WITH PROPOFOL: SHX5780

## 2021-05-29 SURGERY — COLONOSCOPY WITH PROPOFOL
Anesthesia: General

## 2021-05-29 MED ORDER — PROPOFOL 10 MG/ML IV BOLUS
INTRAVENOUS | Status: DC | PRN
  Administered 2021-05-29 (×2): 50 mg via INTRAVENOUS
  Administered 2021-05-29: 100 mg via INTRAVENOUS

## 2021-05-29 MED ORDER — STERILE WATER FOR IRRIGATION IR SOLN
Status: DC | PRN
  Administered 2021-05-29: 200 mL

## 2021-05-29 MED ORDER — OMEPRAZOLE 20 MG PO CPDR: 20.0000 mg | DELAYED_RELEASE_CAPSULE | Freq: Every day | ORAL | 5 refills | Status: DC

## 2021-05-29 MED ORDER — LACTATED RINGERS IV SOLN: INTRAVENOUS | Status: DC

## 2021-05-29 MED ORDER — PROPOFOL 500 MG/50ML IV EMUL
INTRAVENOUS | Status: DC | PRN
  Administered 2021-05-29: 100 ug/kg/min via INTRAVENOUS

## 2021-05-29 MED ORDER — LIDOCAINE HCL (CARDIAC) PF 50 MG/5ML IV SOSY
PREFILLED_SYRINGE | INTRAVENOUS | Status: DC | PRN
  Administered 2021-05-29: 50 mg via INTRAVENOUS

## 2021-05-29 NOTE — Anesthesia Preprocedure Evaluation (Addendum)
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, 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) negative psych ROS   GI/Hepatic negative GI ROS, Neg liver ROS,   Endo/Other  negative endocrine ROS  Renal/GU negative Renal ROS     Musculoskeletal  (+) Arthritis ,   Abdominal   Peds  Hematology  (+) anemia ,   Anesthesia Other Findings   Reproductive/Obstetrics                            Anesthesia Physical Anesthesia Plan  ASA: 2  Anesthesia Plan: General   Post-op Pain Management:    Induction: Intravenous  PONV Risk Score and Plan: Propofol infusion  Airway Management Planned: Nasal Cannula and Natural Airway  Additional Equipment:   Intra-op Plan:   Post-operative Plan:   Informed Consent: I have reviewed the patients History and Physical, chart, labs and discussed the procedure including the risks, benefits and alternatives for the proposed anesthesia with the patient or authorized representative who has indicated his/her understanding and acceptance.       Plan Discussed with: Surgeon and CRNA  Anesthesia Plan Comments:       Anesthesia Quick Evaluation

## 2021-05-29 NOTE — Transfer of Care (Signed)
Immediate Anesthesia Transfer of Care Note  Patient: Jenna Smith  Procedure(s) Performed: COLONOSCOPY WITH PROPOFOL ESOPHAGOGASTRODUODENOSCOPY (EGD) WITH PROPOFOL  Patient Location: Endoscopy Unit  Anesthesia Type:General  Level of Consciousness: awake and patient cooperative  Airway & Oxygen Therapy: Patient Spontanous Breathing  Post-op Assessment: Report given to RN and Post -op Vital signs reviewed and stable  Post vital signs: Reviewed and stable  Last Vitals:  Vitals Value Taken Time  BP    Temp    Pulse    Resp    SpO2      Last Pain:  Vitals:   05/29/21 1024  TempSrc: Oral  PainSc: 0-No pain      Patients Stated Pain Goal: 3 ([id number])  Complications: No notable events documented.

## 2021-05-29 NOTE — Discharge Instructions (Addendum)
EGD Discharge instructions Please read the instructions outlined below and refer to this sheet in the next few weeks. These discharge instructions provide you with general information on caring for yourself after you leave the hospital. Your doctor may also give you specific instructions. While your treatment has been planned according to the most current medical practices available, unavoidable complications occasionally occur. If you have any problems or questions after discharge, please call your doctor. ACTIVITY You may resume your regular activity but move at a slower pace for the next 24 hours.  Take frequent rest periods for the next 24 hours.  Walking will help expel (get rid of) the air and reduce the bloated feeling in your abdomen.  No driving for 24 hours (because of the anesthesia (medicine) used during the test).  You may shower.  Do not sign any important legal documents or operate any machinery for 24 hours (because of the anesthesia used during the test).  NUTRITION Drink plenty of fluids.  You may resume your normal diet.  Begin with a light meal and progress to your normal diet.  Avoid alcoholic beverages for 24 hours or as instructed by your caregiver.  MEDICATIONS You may resume your normal medications unless your caregiver tells you otherwise.  WHAT YOU CAN EXPECT TODAY You may experience abdominal discomfort such as a feeling of fullness or "gas" pains.  FOLLOW-UP Your doctor will discuss the results of your test with you.  SEEK IMMEDIATE MEDICAL ATTENTION IF ANY OF THE FOLLOWING OCCUR: Excessive nausea (feeling sick to your stomach) and/or vomiting.  Severe abdominal pain and distention (swelling).  Trouble swallowing.  Temperature over 101 F (37.8 C).  Rectal bleeding or vomiting of blood.     Colonoscopy Discharge Instructions  Read the instructions outlined below and refer to this sheet in the next few weeks. These discharge instructions provide you with  general information on caring for yourself after you leave the hospital. Your doctor may also give you specific instructions. While your treatment has been planned according to the most current medical practices available, unavoidable complications occasionally occur.   ACTIVITY You may resume your regular activity, but move at a slower pace for the next 24 hours.  Take frequent rest periods for the next 24 hours.  Walking will help get rid of the air and reduce the bloated feeling in your belly (abdomen).  No driving for 24 hours (because of the medicine (anesthesia) used during the test).   Do not sign any important legal documents or operate any machinery for 24 hours (because of the anesthesia used during the test).  NUTRITION Drink plenty of fluids.  You may resume your normal diet as instructed by your doctor.  Begin with a light meal and progress to your normal diet. Heavy or fried foods are harder to digest and may make you feel sick to your stomach (nauseated).  Avoid alcoholic beverages for 24 hours or as instructed.  MEDICATIONS You may resume your normal medications unless your doctor tells you otherwise.  WHAT YOU CAN EXPECT TODAY Some feelings of bloating in the abdomen.  Passage of more gas than usual.  Spotting of blood in your stool or on the toilet paper.  IF YOU HAD POLYPS REMOVED DURING THE COLONOSCOPY: No aspirin products for 7 days or as instructed.  No alcohol for 7 days or as instructed.  Eat a soft diet for the next 24 hours.  FINDING OUT THE RESULTS OF YOUR TEST Not all test results are  available during your visit. If your test results are not back during the visit, make an appointment with your caregiver to find out the results. Do not assume everything is normal if you have not heard from your caregiver or the medical facility. It is important for you to follow up on all of your test results.  SEEK IMMEDIATE MEDICAL ATTENTION IF: You have more than a spotting of  blood in your stool.  Your belly is swollen (abdominal distention).  You are nauseated or vomiting.  You have a temperature over 101.  You have abdominal pain or discomfort that is severe or gets worse throughout the day.   Your EGD revealed a mild amount inflammation in her stomach.  I took biopsies of this to rule out infection with a bacteria called H. pylori.  Await pathology results, my office will contact you.  I am going to start you on a new medication called omeprazole 20 mg daily.  This medication works best if you take it 30 minutes before breakfast.  I want to take this for 8 weeks.  Avoid NSAIDs as best as you can.  Your colonoscopy revealed 1 polyp(s) which I removed successfully.  This polyp was greater than a centimeter big.  Await pathology results, my office will contact you. I recommend repeating colonoscopy in 3-5 years for surveillance purposes depending on pathology.   Otherwise follow-up with GI in 3 months.  OFFICE WILL CALL WITH APPOINTMENT   I hope you have a great rest of your week!  Elon Alas. Abbey Chatters, D.O. Gastroenterology and Hepatology [facility]

## 2021-05-29 NOTE — Interval H&P Note (Signed)
History and Physical Interval Note:  05/29/2021 10:54 AM  Jenna Smith  has presented today for surgery, with the diagnosis of colon cancer screening, epigastric pain.  The various methods of treatment have been discussed with the patient and family. After consideration of risks, benefits and other options for treatment, the patient has consented to  Procedure(s) with comments: COLONOSCOPY WITH PROPOFOL (N/A) - 11:30am ESOPHAGOGASTRODUODENOSCOPY (EGD) WITH PROPOFOL (N/A) as a surgical intervention.  The patient's history has been reviewed, patient examined, no change in status, stable for surgery.  I have reviewed the patient's chart and labs.  Questions were answered to the patient's satisfaction.     Eloise Harman

## 2021-05-29 NOTE — Op Note (Signed)
[facility] Patient Name: Jenna Smith Procedure Date: 05/29/2021 11:18 AM MRN: [id number] Date of Birth: Aug 05, 1951 Attending MD: Hennie Duos. Marletta Lor DO CSN: [id number] Age: 70 Admit Type: Outpatient Procedure:                Upper GI endoscopy Indications:              Epigastric abdominal pain, Abdominal bloating Providers:                Hennie Duos. Marletta Lor, DO, Nena Polio, RN, Kristine L.                            Jessee Avers, Technician Referring MD:              Medicines:                See the Anesthesia note for documentation of the                            administered medications Complications:            No immediate complications. Estimated Blood Loss:     Estimated blood loss was minimal. Procedure:                Pre-Anesthesia Assessment:                           - The anesthesia plan was to use monitored                            anesthesia care (MAC).                           After obtaining informed consent, the endoscope was                            passed under direct vision. Throughout the                            procedure, the patient's blood pressure, pulse, and                            oxygen saturations were monitored continuously. The                            [id number]-H190 ([id number]) scope was introduced through the                            mouth, and advanced to the second part of duodenum.                            The upper GI endoscopy was accomplished without                            difficulty. The patient tolerated the procedure                            well.  Scope In: 11:36:42 AM Scope Out: 11:39:33 AM Total Procedure Duration: 0 hours 2 minutes 51 seconds  Findings:      There is no endoscopic evidence of bleeding, areas of erosion,       esophagitis, hiatal hernia, ulcerations or varices in the entire       esophagus.      The Z-line was regular and was found 38 cm from the incisors.      Localized mild inflammation  characterized by erythema was found in the       gastric antrum. Biopsies were taken with a cold forceps for Helicobacter       pylori testing.      The duodenal bulb, first portion of the duodenum and second portion of       the duodenum were normal. Impression:               - Z-line regular, 38 cm from the incisors.                           - Gastritis. Biopsied.                           - Normal duodenal bulb, first portion of the                            duodenum and second portion of the duodenum. Moderate Sedation:      Per Anesthesia Care Recommendation:           - Patient has a contact number available for                            emergencies. The signs and symptoms of potential                            delayed complications were discussed with the                            patient. Return to normal activities tomorrow.                            Written discharge instructions were provided to the                            patient.                           - Patient has a contact number available for                            emergencies. The signs and symptoms of potential                            delayed complications were discussed with the                            patient. Return to normal activities tomorrow.  Written discharge instructions were provided to the                            patient.                           - Resume previous diet.                           - Continue present medications.                           - Await pathology results.                           - Use Prilosec (omeprazole) 20 mg PO daily.                           - Return to GI clinic in 3 months. Procedure Code(s):        --- Professional ---                           [id number], Esophagogastroduodenoscopy, flexible,                            transoral; with biopsy, single or multiple Diagnosis Code(s):        --- Professional ---                            K29.70, Gastritis, unspecified, without bleeding                           R10.13, Epigastric pain                           R14.0, Abdominal distension (gaseous) CPT copyright 2019 American Medical Association. All rights reserved. The codes documented in this report are preliminary and upon coder review may  be revised to meet current compliance requirements. Hennie Duos. Marletta Lor, DO Hennie Duos. Marletta Lor, DO 05/29/2021 11:43:12 AM This report has been signed electronically. Number of Addenda: 0

## 2021-05-29 NOTE — Anesthesia Procedure Notes (Signed)
Date/Time: 05/29/2021 11:29 AM Performed by: Vista Deck, CRNA Pre-anesthesia Checklist: Patient identified, Emergency Drugs available, Suction available, Timeout performed and Patient being monitored Patient Re-evaluated:Patient Re-evaluated prior to induction Oxygen Delivery Method: Nasal Cannula

## 2021-05-29 NOTE — Op Note (Signed)
[facility] Patient Name: Jenna Smith Procedure Date: 05/29/2021 11:43 AM MRN: [id number] Date of Birth: Jan 09, 1951 Attending MD: Hennie Duos. Marletta Lor DO CSN: [id number] Age: 70 Admit Type: Outpatient Procedure:                Colonoscopy Indications:              Screening for colorectal malignant neoplasm Providers:                Hennie Duos. Marletta Lor, DO, Nena Polio, RN, Kristine L.                            Jessee Avers, Technician Referring MD:              Medicines:                See the Anesthesia note for documentation of the                            administered medications Complications:            No immediate complications. Estimated Blood Loss:     Estimated blood loss was minimal. Procedure:                Pre-Anesthesia Assessment:                           - The anesthesia plan was to use monitored                            anesthesia care (MAC).                           After obtaining informed consent, the colonoscope                            was passed under direct vision. Throughout the                            procedure, the patient's blood pressure, pulse, and                            oxygen saturations were monitored continuously. The                            [id number]-HQ190L ([id number]) scope was introduced through                            the anus and advanced to the the cecum, identified                            by appendiceal orifice and ileocecal valve. The                            colonoscopy was performed without difficulty. The                            patient tolerated the procedure  well. The quality                            of the bowel preparation was evaluated using the                            BBPS [facility] Bowel Preparation Scale) with scores                            of: Right Colon = 3, Transverse Colon = 3 and Left                            Colon = 3 (entire mucosa seen well with no residual                             staining, small fragments of stool or opaque                            liquid). The total BBPS score equals 9. Scope In: 11:45:06 AM Scope Out: 11:59:45 AM Scope Withdrawal Time: 0 hours 12 minutes 46 seconds  Total Procedure Duration: 0 hours 14 minutes 39 seconds  Findings:      The perianal and digital rectal examinations were normal.      Non-bleeding internal hemorrhoids were found during endoscopy.      A 10 mm polyp was found in the cecum. The polyp was carpet-like and       semi-sessile. The polyp was removed with a cold snare. Resection and       retrieval were complete. Impression:               - Non-bleeding internal hemorrhoids.                           - One 10 mm polyp in the cecum, removed with a cold                            snare. Resected and retrieved. Moderate Sedation:      Per Anesthesia Care Recommendation:           - Patient has a contact number available for                            emergencies. The signs and symptoms of potential                            delayed complications were discussed with the                            patient. Return to normal activities tomorrow.                            Written discharge instructions were provided to the                            patient.                           -  Resume previous diet.                           - Continue present medications.                           - Await pathology results.                           - Repeat colonoscopy in 3 years for surveillance.                           - Return to GI clinic PRN. Procedure Code(s):        --- Professional ---                           [phone], Colonoscopy, flexible; with removal of                            tumor(s), polyp(s), or other lesion(s) by snare                            technique Diagnosis Code(s):        --- Professional ---                           Z12.11, Encounter for screening for malignant                            neoplasm  of colon                           K63.5, Polyp of colon                           K64.8, Other hemorrhoids CPT copyright 2019 American Medical Association. All rights reserved. The codes documented in this report are preliminary and upon coder review may  be revised to meet current compliance requirements. Hennie Duos. Marletta Lor, DO Hennie Duos. Marletta Lor, DO 05/29/2021 12:04:25 PM This report has been signed electronically. Number of Addenda: 0

## 2021-05-29 NOTE — Anesthesia Postprocedure Evaluation (Signed)
Anesthesia Post Note  Patient: Jenna Smith  Procedure(s) Performed: COLONOSCOPY WITH PROPOFOL ESOPHAGOGASTRODUODENOSCOPY (EGD) WITH PROPOFOL  Patient location during evaluation: Endoscopy Anesthesia Type: General Level of consciousness: awake and alert and oriented Pain management: pain level controlled Vital Signs Assessment: post-procedure vital signs reviewed and stable Respiratory status: spontaneous breathing and respiratory function stable Cardiovascular status: blood pressure returned to baseline and stable Postop Assessment: no apparent nausea or vomiting Anesthetic complications: no   No notable events documented.   Last Vitals:  Vitals:   05/29/21 1024 05/29/21 1206  BP: 122/63 (!) 91/50  Pulse: 68   Resp: (!) 21 (!) 21  Temp: 36.4 C (!) 36.4 C  SpO2: 99% 96%    Last Pain:  Vitals:   05/29/21 1206  TempSrc: Oral  PainSc: 0-No pain                 Huey Scalia C Zerrick Hanssen

## 2021-05-30 ENCOUNTER — Other Ambulatory Visit: Payer: Self-pay | Admitting: Gastroenterology

## 2021-05-30 LAB — SURGICAL PATHOLOGY

## 2021-05-30 MED ORDER — OMEPRAZOLE 20 MG PO CPDR: 20.0000 mg | DELAYED_RELEASE_CAPSULE | Freq: Every day | ORAL | 5 refills | Status: DC

## 2021-06-06 ENCOUNTER — Encounter (HOSPITAL_COMMUNITY): Payer: Self-pay | Admitting: Internal Medicine

## 2021-06-26 ENCOUNTER — Ambulatory Visit: Payer: Medicare Other | Admitting: Family Medicine

## 2021-06-27 ENCOUNTER — Ambulatory Visit: Payer: Medicare Other | Admitting: Family Medicine

## 2021-07-06 ENCOUNTER — Ambulatory Visit (INDEPENDENT_AMBULATORY_CARE_PROVIDER_SITE_OTHER): Payer: Medicare Other | Admitting: Family Medicine

## 2021-07-06 DIAGNOSIS — U071 COVID-19: Secondary | ICD-10-CM

## 2021-07-06 MED ORDER — BENZONATATE 200 MG PO CAPS: 200.0000 mg | ORAL_CAPSULE | Freq: Two times a day (BID) | ORAL | 0 refills | Status: DC | PRN

## 2021-07-06 MED ORDER — MOLNUPIRAVIR EUA 200MG CAPSULE: 4.0000 | ORAL_CAPSULE | Freq: Two times a day (BID) | ORAL | 0 refills | Status: AC

## 2021-07-06 MED ORDER — PROMETHAZINE-DM 6.25-15 MG/5ML PO SYRP: 2.5000 mL | ORAL_SOLUTION | Freq: Four times a day (QID) | ORAL | 0 refills | Status: DC | PRN

## 2021-07-06 NOTE — Progress Notes (Signed)
Telephone visit  Subjective: CC: COVID19 PCP: Janora Norlander, DO [id number] Jenna Smith is Jenna 70 y.o. female calls for telephone consult today. Patient provides verbal consent for consult held via phone.  Due to COVID-19 pandemic this visit was conducted virtually. This visit type was conducted due to national recommendations for restrictions regarding the COVID-19 Pandemic (e.g. social distancing, sheltering in place) in an effort to limit this patient's exposure and mitigate transmission in our community. All issues noted in this document were discussed and addressed.  Jenna physical exam was not performed with this format.   Location of patient: home Location of provider: [facility] Others present for call: none  1. COVID-19 Tested positive 2 days ago.  She was exposed by daughter in [employer], who works in [facility].  She reports poor nights sleep last night.  She is having headaches that has improved with tylenol.  She reports mild cough.  No chest congestion/ nausea, rhinorrhea.  Mild nasal congestion.  No wheezing, SOB or fevers.  No nausea, vomiting, diarrhea.  Tolerating fluids well.   ROS: Per HPI  Allergies  Allergen Reactions   Bactrim [Sulfamethoxazole-Trimethoprim] Shortness Of Breath   Bee Venom Anaphylaxis   Prednisone     Pt states she thinks this was due to her taking prednisone with her pain medications-has taken prednisone since and didn't have any problems   Indomethacin Swelling    Face swells up   Avocado Hives   Latex Hives   Neosporin [Neomycin-Bacitracin Zn-Polymyx] Hives   Other     Numerous food allergies: Broccoli, buckwheat, cabbage, cantaloupe, cinnamon, corn, polyps, lumen, molds, mushroom, black pepper, green pepper, pineapple, sweet potato, spinach, tomato, whole-wheat, whole egg, [place] meat, cow milk, crab, lobster, salmon, shellfish mix, coconut, black walnut.  *Patient states she continues to eat essentially all of these items.   Penicillins Hives     Has patient had Jenna PCN reaction causing immediate rash, facial/tongue/throat swelling, SOB or lightheadedness with hypotension: no Has patient had Jenna PCN reaction causing severe rash involving mucus membranes or skin necrosis: no Has patient had Jenna PCN reaction that required hospitalization: no Has patient had Jenna PCN reaction occurring within the last 10 years: no If all of the above answers are "NO", then may proceed with Cephalosporin use.    Pomegranate (Punica Granatum) Hives   Past Medical History:  Diagnosis Date   Allergy    multiple food and environmental allergies   Anemia 12/22/2017   Arthritis     Current Outpatient Medications:    acetaminophen (TYLENOL) 650 MG CR tablet, Take 650 mg by mouth every 8 (eight) hours as needed for pain., Disp: , Rfl:    Biotin w/ Vitamins C & E (HAIR SKIN & NAILS GUMMIES PO), Take 2 each by mouth daily., Disp: , Rfl:    Calcium Carbonate-Vit D-Min (CALCIUM 1200) 1200-1000 MG-UNIT CHEW, Chew 2 tablets by mouth daily., Disp: , Rfl:    cetirizine (ZYRTEC) 10 MG tablet, Take 10 mg by mouth daily as needed for allergies., Disp: , Rfl:    Cholecalciferol (VITAMIN D3) 250 MCG (10000 UT) capsule, Take 10,000 Units by mouth daily., Disp: , Rfl:    Omega-3 Fatty Acids (FISH OIL) 1000 MG CAPS, Take 2,000 mg by mouth in the morning, at noon, and at bedtime. Relief Factor, Disp: , Rfl:    omeprazole (PRILOSEC) 20 MG capsule, Take 1 capsule (20 mg total) by mouth daily before breakfast., Disp: 30 capsule, Rfl: 5   OVER THE  COUNTER MEDICATION, Take 1 tablet by mouth daily. Zinc '100mg'$  Vitamin C '1000mg'$  Vitamin d3 1000 IU daily, Disp: , Rfl:    OVER THE COUNTER MEDICATION, Take 1 tablet by mouth daily. Magnesium 375 Potassium 450, Disp: , Rfl:    polyethylene glycol-electrolytes (TRILYTE) 420 g solution, Take 4,000 mLs by mouth as directed., Disp: 4000 mL, Rfl: 0  Assessment/ Plan: 70 y.o. female   COVID-19 - Plan: molnupiravir EUA 200 mg CAPS, benzonatate  (TESSALON) 200 MG capsule, promethazine-dextromethorphan (PROMETHAZINE-DM) 6.25-15 MG/5ML syrup  No red flag signs or symptoms.  Cough medications, Molnupiravir sent to pharmacy.  Discussed red flag signs and symptoms which would warrant further evaluation.  She voiced good understanding.  Push fluids.  Follow-up as needed  Start time: 1:46pm End time: 1:51pm  Total time spent on patient care (including telephone call/ virtual visit): 5 minutes  [facility], [place] [phone]

## 2021-07-11 ENCOUNTER — Telehealth: Payer: Self-pay | Admitting: Family Medicine

## 2021-07-11 NOTE — Telephone Encounter (Signed)
If her feet are blue, she should probably be seen.  The liquid was a simple cough medication.  I do not know anything that she has listed as a side effect.

## 2021-07-31 ENCOUNTER — Ambulatory Visit (INDEPENDENT_AMBULATORY_CARE_PROVIDER_SITE_OTHER): Payer: Medicare Other

## 2021-07-31 VITALS — Ht 66.0 in | Wt 191.0 lb

## 2021-07-31 DIAGNOSIS — Z1231 Encounter for screening mammogram for malignant neoplasm of breast: Secondary | ICD-10-CM | POA: Diagnosis not present

## 2021-07-31 DIAGNOSIS — Z0000 Encounter for general adult medical examination without abnormal findings: Secondary | ICD-10-CM

## 2021-07-31 NOTE — Patient Instructions (Signed)
Jenna Smith , Thank you for taking time to come for your Medicare Wellness Visit. I appreciate your ongoing commitment to your health goals. Please review the following plan we discussed and let me know if I can assist you in the future.   Screening recommendations/referrals: Colonoscopy: Done 05/29/2021 - Repeat in 10 years Mammogram: Done 06/21/2020 - Repeat annually (Ordered today) Bone Density: Done 07/26/2020 - Repeat every 2 years Recommended yearly ophthalmology/optometry visit for glaucoma screening and checkup Recommended yearly dental visit for hygiene and checkup  Vaccinations: Influenza vaccine: Declined Pneumococcal vaccine: Declined Tdap vaccine: Done 2016 - Repeat in 10 years  Shingles vaccine: Declined   Covid-19:Declined  Advanced directives: in chart  Conditions/risks identified: Aim for 30 minutes of exercise or brisk walking each day, drink 6-8 glasses of water and eat lots of fruits and vegetables.   Next appointment: Follow up in one year for your annual wellness visit    Preventive Care 65 Years and Older, Female Preventive care refers to lifestyle choices and visits with your health care provider that can promote health and wellness. What does preventive care include? A yearly physical exam. This is also called an annual well check. Dental exams once or twice a year. Routine eye exams. Ask your health care provider how often you should have your eyes checked. Personal lifestyle choices, including: Daily care of your teeth and gums. Regular physical activity. Eating a healthy diet. Avoiding tobacco and drug use. Limiting alcohol use. Practicing safe sex. Taking low-dose aspirin every day. Taking vitamin and mineral supplements as recommended by your health care provider. What happens during an annual well check? The services and screenings done by your health care provider during your annual well check will depend on your age, overall health, lifestyle risk  factors, and family history of disease. Counseling  Your health care provider may ask you questions about your: Alcohol use. Tobacco use. Drug use. Emotional well-being. Home and relationship well-being. Sexual activity. Eating habits. History of falls. Memory and ability to understand (cognition). Work and work Statistician. Reproductive health. Screening  You may have the following tests or measurements: Height, weight, and BMI. Blood pressure. Lipid and cholesterol levels. These may be checked every 5 years, or more frequently if you are over 38 years old. Skin check. Lung cancer screening. You may have this screening every year starting at age 3 if you have a 30-pack-year history of smoking and currently smoke or have quit within the past 15 years. Fecal occult blood test (FOBT) of the stool. You may have this test every year starting at age 50. Flexible sigmoidoscopy or colonoscopy. You may have a sigmoidoscopy every 5 years or a colonoscopy every 10 years starting at age 78. Hepatitis C blood test. Hepatitis B blood test. Sexually transmitted disease (STD) testing. Diabetes screening. This is done by checking your blood sugar (glucose) after you have not eaten for a while (fasting). You may have this done every 1-3 years. Bone density scan. This is done to screen for osteoporosis. You may have this done starting at age 47. Mammogram. This may be done every 1-2 years. Talk to your health care provider about how often you should have regular mammograms. Talk with your health care provider about your test results, treatment options, and if necessary, the need for more tests. Vaccines  Your health care provider may recommend certain vaccines, such as: Influenza vaccine. This is recommended every year. Tetanus, diphtheria, and acellular pertussis (Tdap, Td) vaccine. You may need a  Td booster every 10 years. Zoster vaccine. You may need this after age 24. Pneumococcal 13-valent  conjugate (PCV13) vaccine. One dose is recommended after age 72. Pneumococcal polysaccharide (PPSV23) vaccine. One dose is recommended after age 55. Talk to your health care provider about which screenings and vaccines you need and how often you need them. This information is not intended to replace advice given to you by your health care provider. Make sure you discuss any questions you have with your health care provider. Document Released: 11/10/2015 Document Revised: 07/03/2016 Document Reviewed: 08/15/2015 Elsevier Interactive Patient Education  2017 [place] Prevention in the Home Falls can cause injuries. They can happen to people of all ages. There are many things you can do to make your home safe and to help prevent falls. What can I do on the outside of my home? Regularly fix the edges of walkways and driveways and fix any cracks. Remove anything that might make you trip as you walk through a door, such as a raised step or threshold. Trim any bushes or trees on the path to your home. Use bright outdoor lighting. Clear any walking paths of anything that might make someone trip, such as rocks or tools. Regularly check to see if handrails are loose or broken. Make sure that both sides of any steps have handrails. Any raised decks and porches should have guardrails on the edges. Have any leaves, snow, or ice cleared regularly. Use sand or salt on walking paths during winter. Clean up any spills in your garage right away. This includes oil or grease spills. What can I do in the bathroom? Use night lights. Install grab bars by the toilet and in the tub and shower. Do not use towel bars as grab bars. Use non-skid mats or decals in the tub or shower. If you need to sit down in the shower, use a plastic, non-slip stool. Keep the floor dry. Clean up any water that spills on the floor as soon as it happens. Remove soap buildup in the tub or shower regularly. Attach bath mats  securely with double-sided non-slip rug tape. Do not have throw rugs and other things on the floor that can make you trip. What can I do in the bedroom? Use night lights. Make sure that you have a light by your bed that is easy to reach. Do not use any sheets or blankets that are too big for your bed. They should not hang down onto the floor. Have a firm chair that has side arms. You can use this for support while you get dressed. Do not have throw rugs and other things on the floor that can make you trip. What can I do in the kitchen? Clean up any spills right away. Avoid walking on wet floors. Keep items that you use a lot in easy-to-reach places. If you need to reach something above you, use a strong step stool that has a grab bar. Keep electrical cords out of the way. Do not use floor polish or wax that makes floors slippery. If you must use wax, use non-skid floor wax. Do not have throw rugs and other things on the floor that can make you trip. What can I do with my stairs? Do not leave any items on the stairs. Make sure that there are handrails on both sides of the stairs and use them. Fix handrails that are broken or loose. Make sure that handrails are as long as the stairways. Check any  carpeting to make sure that it is firmly attached to the stairs. Fix any carpet that is loose or worn. Avoid having throw rugs at the top or bottom of the stairs. If you do have throw rugs, attach them to the floor with carpet tape. Make sure that you have a light switch at the top of the stairs and the bottom of the stairs. If you do not have them, ask someone to add them for you. What else can I do to help prevent falls? Wear shoes that: Do not have high heels. Have rubber bottoms. Are comfortable and fit you well. Are closed at the toe. Do not wear sandals. If you use a stepladder: Make sure that it is fully opened. Do not climb a closed stepladder. Make sure that both sides of the stepladder  are locked into place. Ask someone to hold it for you, if possible. Clearly mark and make sure that you can see: Any grab bars or handrails. First and last steps. Where the edge of each step is. Use tools that help you move around (mobility aids) if they are needed. These include: Canes. Walkers. Scooters. Crutches. Turn on the lights when you go into a dark area. Replace any light bulbs as soon as they burn out. Set up your furniture so you have a clear path. Avoid moving your furniture around. If any of your floors are uneven, fix them. If there are any pets around you, be aware of where they are. Review your medicines with your doctor. Some medicines can make you feel dizzy. This can increase your chance of falling. Ask your doctor what other things that you can do to help prevent falls. This information is not intended to replace advice given to you by your health care provider. Make sure you discuss any questions you have with your health care provider. Document Released: 08/10/2009 Document Revised: 03/21/2016 Document Reviewed: 11/18/2014 Elsevier Interactive Patient Education  2017 [employer].

## 2021-07-31 NOTE — Progress Notes (Signed)
Subjective:   Jenna Smith is a 70 y.o. female who presents for Medicare Annual (Subsequent) preventive examination.  Virtual Visit via Telephone Note  I connected with  [name] on 07/31/21 at 11:15 AM EDT by telephone and verified that I am speaking with the correct person using two identifiers.  Location: Patient: Home Provider: [facility] Persons participating in the virtual visit: patient/Nurse Health Advisor   I discussed the limitations, risks, security and privacy concerns of performing an evaluation and management service by telephone and the availability of in person appointments. The patient expressed understanding and agreed to proceed.  Interactive audio and video telecommunications were attempted between this nurse and patient, however failed, due to patient having technical difficulties OR patient did not have access to video capability.  We continued and completed visit with audio only.  Some vital signs may be absent or patient reported.   Jenna Smith E Clovis Mankins, LPN   Review of Systems     Cardiac Risk Factors include: advanced age (>76men, >53 women);obesity (BMI >30kg/m2)     Objective:    Today's Vitals   07/31/21 1120  Weight: 191 lb (86.6 kg)  Height: 5\' 6"  (1.676 m)   Body mass index is 30.83 kg/m.  Advanced Directives 07/31/2021 05/29/2021 10/06/2020 04/20/2020 04/09/2019 12/22/2017 12/22/2017  Does Patient Have a Medical Advance Directive? Yes Yes No Yes No No No  Type of Paramedic of [place];Living will Living will - Living will - - -  Does patient want to make changes to medical advance directive? - - - No - Patient declined - - -  Copy of [place] in Chart? Yes - validated most recent copy scanned in chart (See row information) - - - - - -  Would patient like information on creating a medical advance directive? - - No - Patient declined No - Patient declined No - Patient declined No - Patient declined  No - Patient declined    Current Medications (verified) Outpatient Encounter Medications as of 07/31/2021  Medication Sig   acetaminophen (TYLENOL) 650 MG CR tablet Take 650 mg by mouth every 8 (eight) hours as needed for pain.   Biotin w/ Vitamins C & E (HAIR SKIN & NAILS GUMMIES PO) Take 2 each by mouth daily.   Calcium Carbonate-Vit D-Min (CALCIUM 1200) 1200-1000 MG-UNIT CHEW Chew 2 tablets by mouth daily.   cetirizine (ZYRTEC) 10 MG tablet Take 10 mg by mouth daily as needed for allergies.   Cholecalciferol (VITAMIN D3) 250 MCG (10000 UT) capsule Take 10,000 Units by mouth daily.   Magnesium Gluconate 550 MG TABS    Omega-3 Fatty Acids (FISH OIL) 1000 MG CAPS Take 2,000 mg by mouth in the morning, at noon, and at bedtime. Relief Factor   omeprazole (PRILOSEC) 20 MG capsule Take 1 capsule (20 mg total) by mouth daily before breakfast.   OVER THE COUNTER MEDICATION Take 1 tablet by mouth daily. Zinc 100mg  Vitamin C 1000mg  Vitamin d3 1000 IU daily   OVER THE COUNTER MEDICATION Take 1 tablet by mouth daily. Magnesium 375 Potassium 450   polyethylene glycol-electrolytes (TRILYTE) 420 g solution Take 4,000 mLs by mouth as directed.   benzonatate (TESSALON) 200 MG capsule Take 1 capsule (200 mg total) by mouth 2 (two) times daily as needed for cough. (Patient not taking: Reported on 07/31/2021)   promethazine-dextromethorphan (PROMETHAZINE-DM) 6.25-15 MG/5ML syrup Take 2.5 mLs by mouth 4 (four) times daily as needed for cough. (Patient not taking:  Reported on 07/31/2021)   No facility-administered encounter medications on file as of 07/31/2021.    Allergies (verified) Bactrim [sulfamethoxazole-trimethoprim], Bee venom, Prednisone, Indomethacin, Avocado, Bacitracin, Latex, Neosporin [neomycin-bacitracin zn-polymyx], Other, Penicillins, Polymyxin b, and Pomegranate (punica granatum)   History: Past Medical History:  Diagnosis Date   Allergy    multiple food and environmental allergies   Anemia  12/22/2017   Arthritis    Past Surgical History:  Procedure Laterality Date   BREAST SURGERY Bilateral May 2001   Breast reduction   COLONOSCOPY WITH PROPOFOL N/A 05/29/2021   Procedure: COLONOSCOPY WITH PROPOFOL;  Surgeon: Eloise Harman, DO;  [facility]: [facility];  Service: Endoscopy;  Laterality: N/A;  11:30am   COSMETIC SURGERY  September 2012   Face lift   ESOPHAGOGASTRODUODENOSCOPY (EGD) WITH PROPOFOL N/A 05/29/2021   Procedure: ESOPHAGOGASTRODUODENOSCOPY (EGD) WITH PROPOFOL;  Surgeon: Eloise Harman, DO;  [facility]: [facility];  Service: Endoscopy;  Laterality: N/A;   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   Procedure: RIGHT TOTAL KNEE ARTHROPLASTY;  Surgeon: [name], MD;  Location: [facility];  Service: Orthopedics;  Laterality: Right;   Family History  Problem Relation Age of Onset   Aneurysm Mother        passed away at age 76   Cancer Father        passed away after Prostate cancer metastesize   Cancer Sister        she passed away 50 due to Breast Cancer   Stroke Sister    Aneurysm Sister    Colon cancer Neg Hx    Social History   Socioeconomic History   Marital status: Married    Spouse name: Richardson Landry   Number of children: 1   Years of education: 32   Highest education level: Some college, no degree  Occupational History   Occupation: retired  Tobacco Use   Smoking status: Former    Packs/day: 0.30    Years: 15.00    Pack years: 4.50    Types: Cigarettes    Quit date: 10/28/1984    Years since quitting: 36.7   Smokeless tobacco: Never   Tobacco comments:    smoked "off and on" x 15 yrs  Vaping Use   Vaping Use: Never used  Substance and Sexual Activity   Alcohol use: Not Currently    Comment: Holidays - occasional wine   Drug use: No   Sexual activity: Yes    Birth control/protection: Post-menopausal  Other Topics Concern   Not on file  Social History Narrative   Not on file   Social  Determinants of Health   Financial Resource Strain: Low Risk    Difficulty of Paying Living Expenses: Not hard at all  Food Insecurity: No Food Insecurity   Worried About Charity fundraiser in the Last Year: Never true   [employer] in the Last Year: Never true  Transportation Needs: No Transportation Needs   Lack of Transportation (Medical): No   Lack of Transportation (Non-Medical): No  Physical Activity: Sufficiently Active   Days of Exercise per Week: 4 days   Minutes of Exercise per Session: 60 min  Stress: No Stress Concern Present   Feeling of Stress : Not at all  Social Connections: Moderately Integrated   Frequency of Communication with Friends and Family: More than three times a week   Frequency of Social Gatherings with Friends and Family: More than  three times a week   Attends Religious Services: Never   Active Member of Clubs or Organizations: Yes   Attends Music therapist: More than 4 times per year   Marital Status: Married    Tobacco Counseling Counseling given: Not Answered Tobacco comments: smoked "off and on" x 15 yrs   Clinical Intake:  Pre-visit preparation completed: Yes  Pain : No/denies pain     BMI - recorded: 30.83 Nutritional Status: BMI > 30  Obese Nutritional Risks: None Diabetes: Yes CBG done?: No Did pt. bring in CBG monitor from home?: No  How often do you need to have someone help you when you read instructions, pamphlets, or other written materials from your doctor or pharmacy?: 1 - Never  Diabetic? No  Interpreter Needed?: No  Information entered by :: Hira Trent, LPN   Activities of Daily Living In your present state of health, do you have any difficulty performing the following activities: 07/31/2021  Hearing? N  Vision? N  Difficulty concentrating or making decisions? N  Walking or climbing stairs? N  Dressing or bathing? N  Doing errands, shopping? N  Preparing Food and eating ? N  Using the  Toilet? N  In the past six months, have you accidently leaked urine? N  Do you have problems with loss of bowel control? N  Managing your Medications? N  Managing your Finances? N  Housekeeping or managing your Housekeeping? N  Some recent data might be hidden    Patient Care Team: Janora Norlander, DO as PCP - General (Family Medicine) Eloise Harman, DO as Consulting Physician (Internal Medicine)  Indicate any recent Medical Services you may have received from other than [facility] providers in the past year (date may be approximate).     Assessment:   This is a routine wellness examination for [place].  Hearing/Vision screen Hearing Screening - Comments:: Denies hearing difficulties  Vision Screening - Comments:: Wears rx glasses - up to date with annual eye exams with [facility]  Dietary issues and exercise activities discussed: Current Exercise Habits: Home exercise routine, Type of exercise: walking;strength training/weights, Time (Minutes): 45, Frequency (Times/Week): 4, Weekly Exercise (Minutes/Week): 180, Intensity: Moderate, Exercise limited by: orthopedic condition(s)   Goals Addressed               This Visit's Progress     Patient Stated (pt-stated)   On track     " I want to get the kids moved into their own house and out of mine"       Depression Screen PHQ 2/9 Scores 07/31/2021 10/23/2020 10/04/2020 08/08/2020 04/20/2020 04/09/2019 03/16/2018  PHQ - 2 Score 0 2 0 0 0 0 0  PHQ- 9 Score - 5 - - - - -    Fall Risk Fall Risk  07/31/2021 10/04/2020 08/08/2020 04/20/2020 04/09/2019  Falls in the past year? 0 0 0 0 0  Number falls in past yr: 0 - - - -  Injury with Fall? 0 - - - -  Risk for fall due to : Orthopedic patient - - - -  Follow up Falls prevention discussed - - - -    FALL [place]:  Any stairs in or around the home? Yes  If so, are there any without handrails? No  Home free of loose throw rugs in walkways, pet  beds, electrical cords, etc? Yes  Adequate lighting in your home to reduce risk of falls? Yes  ASSISTIVE DEVICES UTILIZED TO PREVENT FALLS:  Life alert? No  Use of a cane, walker or w/c? No  Grab bars in the bathroom? Yes  Shower chair or bench in shower? No  Elevated toilet seat or a handicapped toilet? No   TIMED UP AND GO:  Was the test performed? No . Telephonic visit  Cognitive Function: Normal cognitive status assessed by direct observation by this Nurse Health Advisor. No abnormalities found.        6CIT Screen 04/20/2020 04/09/2019  What Year? 0 points -  What month? 0 points -  What time? 0 points 0 points  Count back from 20 0 points 0 points  Months in reverse 0 points 0 points  Repeat phrase 0 points 2 points  Total Score 0 -    Immunizations Immunization History  Administered Date(s) Administered   Tdap 10/28/2013    TDAP status: Up to date  Flu Vaccine status: Declined, Education has been provided regarding the importance of this vaccine but patient still declined. Advised may receive this vaccine at local pharmacy or Health Dept. Aware to provide a copy of the vaccination record if obtained from local pharmacy or Health Dept. Verbalized acceptance and understanding.  Pneumococcal vaccine status: Declined,  Education has been provided regarding the importance of this vaccine but patient still declined. Advised may receive this vaccine at local pharmacy or Health Dept. Aware to provide a copy of the vaccination record if obtained from local pharmacy or Health Dept. Verbalized acceptance and understanding.   Covid-19 vaccine status: Declined, Education has been provided regarding the importance of this vaccine but patient still declined. Advised may receive this vaccine at local pharmacy or Health Dept.or vaccine clinic. Aware to provide a copy of the vaccination record if obtained from local pharmacy or Health Dept. Verbalized acceptance and  understanding.  Qualifies for Shingles Vaccine? Yes   Zostavax completed No   Shingrix Completed?: No.    Education has been provided regarding the importance of this vaccine. Patient has been advised to call insurance company to determine out of pocket expense if they have not yet received this vaccine. Advised may also receive vaccine at local pharmacy or Health Dept. Verbalized acceptance and understanding.  Screening Tests Health Maintenance  Topic Date Due   COVID-19 Vaccine (1) Never done   Zoster Vaccines- Shingrix (1 of 2) Never done   INFLUENZA VACCINE  Never done   MAMMOGRAM  06/21/2022   TETANUS/TDAP  10/28/2024   COLONOSCOPY (Pts 45-7yrs Insurance coverage will need to be confirmed)  05/30/2031   DEXA SCAN  Completed   Hepatitis C Screening  Completed   HPV VACCINES  Aged Out    Health Maintenance  Health Maintenance Due  Topic Date Due   COVID-19 Vaccine (1) Never done   Zoster Vaccines- Shingrix (1 of 2) Never done   INFLUENZA VACCINE  Never done    Colorectal cancer screening: Type of screening: Colonoscopy. Completed 05/29/2021. Repeat every 10 years  Mammogram status: Completed 06/21/2020. Repeat every year  Bone Density status: Completed 07/26/2020. Results reflect: Bone density results: OSTEOPENIA. Repeat every 2 years.  Lung Cancer Screening: (Low Dose CT Chest recommended if Age 71-80 years, 30 pack-year currently smoking OR have quit w/in 15years.) does not qualify.   Additional Screening:  Hepatitis C Screening: does qualify; Completed 02/26/2016  Vision Screening: Recommended annual ophthalmology exams for early detection of glaucoma and other disorders of the eye. Is the patient up to date with their annual eye  exam?  Yes  Who is the provider or what is the name of the office in which the patient attends annual eye exams? [facility] If pt is not established with a provider, would they like to be referred to a provider to establish care? No .    Dental Screening: Recommended annual dental exams for proper oral hygiene  Community Resource Referral / Chronic Care Management: CRR required this visit?  No   CCM required this visit?  No      Plan:     I have personally reviewed and noted the following in the patient's chart:   Medical and social history Use of alcohol, tobacco or illicit drugs  Current medications and supplements including opioid prescriptions.  Functional ability and status Nutritional status Physical activity Advanced directives List of other physicians Hospitalizations, surgeries, and ER visits in previous 12 months Vitals Screenings to include cognitive, depression, and falls Referrals and appointments  In addition, I have reviewed and discussed with patient certain preventive protocols, quality metrics, and best practice recommendations. A written personalized care plan for preventive services as well as general preventive health recommendations were provided to patient.     Sandrea Hammond, LPN   16/10/958   Nurse Notes: none

## 2021-08-15 ENCOUNTER — Telehealth (INDEPENDENT_AMBULATORY_CARE_PROVIDER_SITE_OTHER): Payer: Medicare Other | Admitting: Family Medicine

## 2021-08-15 DIAGNOSIS — N62 Hypertrophy of breast: Secondary | ICD-10-CM | POA: Diagnosis not present

## 2021-08-15 NOTE — Progress Notes (Signed)
MyChart Video visit  Subjective: Jenna Smith:[id number] PCP: Janora Norlander, DO Jenna Smith:[id number] A Jenna Smith is a 70 y.o. female. Patient provides verbal consent for consult held via video.  Due to COVID-19 pandemic this visit was conducted virtually. This visit type was conducted due to national recommendations for restrictions regarding the COVID-19 Pandemic (e.g. social distancing, sheltering in place) in an effort to limit this patient's exposure and mitigate transmission in our community. All issues noted in this document were discussed and addressed.  A physical exam was not performed with this format.   Location of patient: home Location of provider: [facility] Others present for call: none  1. Macromastia Wears a 17 DD.  Her bra cuts into her shoulders.  She sees a Restaurant manager, fast food for back pain regularly. She had a breast reduction in 02-11-2000 (was actually smaller before that surgery than she is now) and she didn't go small enough because she was too nervous.  Sister had recurrent breast cancer and she passed away from this in February 10, 2013. Patient's next mammogram is December.    ROS: Per HPI  Allergies  Allergen Reactions   Bactrim [Sulfamethoxazole-Trimethoprim] Shortness Of Breath   Bee Venom Anaphylaxis   Prednisone     Pt states she thinks this was due to her taking prednisone with her pain medications-has taken prednisone since and didn't have any problems   Indomethacin Swelling    Face swells up   Avocado Hives   Bacitracin    Latex Hives   Neosporin [Neomycin-Bacitracin Zn-Polymyx] Hives   Other     Numerous food allergies: Broccoli, buckwheat, cabbage, cantaloupe, cinnamon, corn, polyps, lumen, molds, mushroom, black pepper, green pepper, pineapple, sweet potato, spinach, tomato, whole-wheat, whole egg, [place] meat, cow milk, crab, lobster, salmon, shellfish mix, coconut, black walnut.  *Patient states she continues to eat essentially all of these items.   Penicillins Hives    Has patient  had a PCN reaction causing immediate rash, facial/tongue/throat swelling, SOB or lightheadedness with hypotension: no Has patient had a PCN reaction causing severe rash involving mucus membranes or skin necrosis: no Has patient had a PCN reaction that required hospitalization: no Has patient had a PCN reaction occurring within the last 10 years: no If all of the above answers are "NO", then may proceed with Cephalosporin use.    Polymyxin B    Pomegranate (Punica Granatum) Hives   Past Medical History:  Diagnosis Date   Allergy    multiple food and environmental allergies   Anemia 12/22/2017   Arthritis     Current Outpatient Medications:    acetaminophen (TYLENOL) 650 MG CR tablet, Take 650 mg by mouth every 8 (eight) hours as needed for pain., Disp: , Rfl:    benzonatate (TESSALON) 200 MG capsule, Take 1 capsule (200 mg total) by mouth 2 (two) times daily as needed for cough. (Patient not taking: Reported on 07/31/2021), Disp: 20 capsule, Rfl: 0   Biotin w/ Vitamins C & E (HAIR SKIN & NAILS GUMMIES PO), Take 2 each by mouth daily., Disp: , Rfl:    Calcium Carbonate-Vit D-Min (CALCIUM 1200) 1200-1000 MG-UNIT CHEW, Chew 2 tablets by mouth daily., Disp: , Rfl:    cetirizine (ZYRTEC) 10 MG tablet, Take 10 mg by mouth daily as needed for allergies., Disp: , Rfl:    Cholecalciferol (VITAMIN D3) 250 MCG (10000 UT) capsule, Take 10,000 Units by mouth daily., Disp: , Rfl:    Magnesium Gluconate 550 MG TABS, , Disp: , Rfl:    Omega-3 Fatty Acids (  FISH OIL) 1000 MG CAPS, Take 2,000 mg by mouth in the morning, at noon, and at bedtime. Relief Factor, Disp: , Rfl:    omeprazole (PRILOSEC) 20 MG capsule, Take 1 capsule (20 mg total) by mouth daily before breakfast., Disp: 30 capsule, Rfl: 5   OVER THE COUNTER MEDICATION, Take 1 tablet by mouth daily. Zinc 100mg  Vitamin C 1000mg  Vitamin d3 1000 IU daily, Disp: , Rfl:    OVER THE COUNTER MEDICATION, Take 1 tablet by mouth daily. Magnesium 375 Potassium  450, Disp: , Rfl:    polyethylene glycol-electrolytes (TRILYTE) 420 g solution, Take 4,000 mLs by mouth as directed., Disp: 4000 mL, Rfl: 0   promethazine-dextromethorphan (PROMETHAZINE-DM) 6.25-15 MG/5ML syrup, Take 2.5 mLs by mouth 4 (four) times daily as needed for cough. (Patient not taking: Reported on 07/31/2021), Disp: 118 mL, Rfl: 0  General: Well-appearing female.  No acute distress Breast: Macromastia present  Assessment/ Plan: 70 y.o. female   Macromastia - Plan: Ambulatory referral to Plastic Surgery  Requesting revision breast surgery given macromastia.  I think that she be a good candidate for this, particularly given thoracic back pain that is requiring multiple visits with chiropractic medicine in order to be maintained.  Referral has been placed.  She will contact us if she does not hear back in a timely manner.  Start time: 11:53a End time: 12 pm  Total time spent on patient care (including video visit/ documentation): 7 minutes  [place], [place] [phone]

## 2021-08-26 NOTE — Progress Notes (Signed)
Referring Provider: Janora Norlander, DO Primary Care Physician:  Janora Norlander, DO Primary GI Physician: Dr. Abbey Chatters  Chief Complaint  Patient presents with   pp f/u    Doing better since stopped eating eggs     HPI:   Jenna Smith is a 70 y.o. female presenting today for follow-up of epigastric abdominal pain and chronic loose stools.   Last seen in our office 05/07/2021 for epigastric pain, diarrhea, and colon cancer screening.  She reported couple month history of intermittent epigastric abdominal pain.  She queried whether symptoms were secondary to stress.  Symptoms improving as stress was decreasing, now pain only lasting for about 1 minute when it occurs.  Has not noticed symptoms after eating.  Denied GERD, nausea, vomiting, dysphagia, NSAID use.  Reported chronic history of loose stools typically in the morning with associated abdominal cramping prior to bowel movements that improved thereafter.  No alarm symptoms.  Reported history of IBS with no change in symptoms over the last several years.  Suspected dietary indiscretion in the setting of multiple food allergies also contributing.  She was not bothered by her symptoms and not interested in medications.  Additionally, patient stated if the food was causing her symptoms, she would just deal with it as she was not interested in changing her diet.  Plan to schedule for colonoscopy, possible EGD pending clinical course, H. pylori breath test, celiac screen, and patient would let me know if she was interested in referral to dietitian due to multiple food allergies.  Celiac screen and H. pylori breath test negative.  Procedures completed 05/29/2021: Colonoscopy: Nonbleeding internal hemorrhoids, 10 mm polyp in the cecum.  Pathology revealed tubular adenoma.  Recommended repeat colonoscopy in 3 years. EGD: Gastritis biopsied, normal esophagus and duodenum. Pathology with reactive gastropathy, negative for H. pylori.  Recommended Prilosec 20 mg daily.   Today:  Patient states she is doing great.  She has no GI symptoms.  States she stopped eating eggs and all of her symptoms resolved.  Bowels are moving well without diarrhea.  Denies any abdominal pain whatsoever, reflux symptoms, nausea, vomiting, dysphagia, BRBPR, melena.  States she did try eating eggs once again after symptoms resolved and states it "tore her up".  No longer taking omeprazole as she does not need it.  She tells me that she is hoping to move back to [place] soon as she is miserable here in [place]. Her family and friends are in [place]. She is out of work. 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   Past Medical History:  Diagnosis Date   Allergy    multiple food and environmental allergies   Anemia 12/22/2017   Arthritis     Past Surgical History:  Procedure Laterality Date   BREAST SURGERY Bilateral 02/2000   Breast reduction   COLONOSCOPY WITH PROPOFOL N/A 05/29/2021   Surgeon: Hurshel Keys K, DO; Nonbleeding internal hemorrhoids, 10 mm polyp in the cecum.  Pathology revealed tubular adenoma.  Recommended repeat colonoscopy in 3 years.   COSMETIC SURGERY  06/2011   Face lift   ESOPHAGOGASTRODUODENOSCOPY (EGD) WITH PROPOFOL N/A 05/29/2021   Surgeon: Eloise Harman, DO; Gastritis biopsied, normal esophagus and duodenum. Pathology with reactive gastropathy, negative for H. pylori.   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   Procedure: RIGHT TOTAL KNEE ARTHROPLASTY;  Surgeon: [name],  MD;  Location: [facility];  Service: Orthopedics;  Laterality: Right;    Current Outpatient Medications  Medication Sig Dispense Refill   acetaminophen (TYLENOL) 650 MG CR tablet Take 650 mg by mouth every 8 (eight) hours as needed for pain.     Biotin w/ Vitamins C & E (HAIR SKIN & NAILS GUMMIES PO) Take 2 each by mouth daily.     Calcium  Carbonate-Vit D-Min (CALCIUM 1200) 1200-1000 MG-UNIT CHEW Chew 2 tablets by mouth daily.     cetirizine (ZYRTEC) 10 MG tablet Take 10 mg by mouth daily as needed for allergies.     Cholecalciferol (VITAMIN D3) 250 MCG (10000 UT) capsule Take 10,000 Units by mouth daily.     Magnesium Gluconate 550 MG TABS      Omega-3 Fatty Acids (FISH OIL) 1000 MG CAPS Take 2,000 mg by mouth in the morning, at noon, and at bedtime. Relief Factor     OVER THE COUNTER MEDICATION Take 1 tablet by mouth daily. Zinc 100mg  Vitamin C 1000mg  Vitamin d3 1000 IU daily     OVER THE COUNTER MEDICATION Take 1 tablet by mouth daily. Magnesium 375 Potassium 450     No current facility-administered medications for this visit.    Allergies as of 08/27/2021 - Review Complete 08/27/2021  Allergen Reaction Noted   Bactrim [sulfamethoxazole-trimethoprim] Shortness Of Breath 10/04/2020   Bee venom Anaphylaxis 04/09/2019   Prednisone  03/04/2018   Indomethacin Swelling 07/01/2017   Avocado Hives 05/07/2021   Bacitracin  07/31/2021   Latex Hives 02/26/2016   Neosporin [neomycin-bacitracin zn-polymyx] Hives 02/26/2016   Other  05/07/2021   Penicillins Hives 02/26/2016   Polymyxin b  07/31/2021   Pomegranate (punica granatum) Hives 05/07/2021    Family History  Problem Relation Age of Onset   Aneurysm Mother        passed away at age 23   Cancer Father        passed away after Prostate cancer metastesize   Cancer Sister        she passed away 37 due to Breast Cancer   Stroke Sister    Aneurysm Sister    Colon cancer Neg Hx     Social History   Socioeconomic History   Marital status: Married    Spouse name: Richardson Landry   Number of children: 1   Years of education: 13   Highest education level: Some college, no degree  Occupational History   Occupation: retired  Tobacco Use   Smoking status: Former    Packs/day: 0.30    Years: 15.00    Pack years: 4.50    Types: Cigarettes    Quit date: 10/28/1984    Years  since quitting: 36.8   Smokeless tobacco: Never   Tobacco comments:    smoked "off and on" x 15 yrs  Vaping Use   Vaping Use: Never used  Substance and Sexual Activity   Alcohol use: Not Currently    Comment: Holidays - occasional wine   Drug use: No   Sexual activity: Yes    Birth control/protection: Post-menopausal  Other Topics Concern   Not on file  Social History Narrative   Not on file   Social Determinants of Health   Financial Resource Strain: Low Risk    Difficulty of Paying Living Expenses: Not hard at all  Food Insecurity: No Food Insecurity   Worried About Estate manager/land agent of Food in the Last Year: Never true   [place] in the  Last Year: Never true  Transportation Needs: No Transportation Needs   Lack of Transportation (Medical): No   Lack of Transportation (Non-Medical): No  Physical Activity: Sufficiently Active   Days of Exercise per Week: 4 days   Minutes of Exercise per Session: 60 min  Stress: No Stress Concern Present   Feeling of Stress : Not at all  Social Connections: Moderately Integrated   Frequency of Communication with Friends and Family: More than three times a week   Frequency of Social Gatherings with Friends and Family: More than three times a week   Attends Religious Services: Never   Marine scientist or Organizations: Yes   Attends Music therapist: More than 4 times per year   Marital Status: Married    Review of Systems: Gen: Denies fever, chills, cold or flulike symptoms, presyncope, syncope..  CV: Denies chest pain, palpitations. Resp: Denies dyspnea or cough. GI: See HPI Heme: See HPI  Physical Exam: BP 109/77   Pulse 79   Temp (!) 96.9 F (36.1 C) (Temporal)   Ht 5\' 6"  (1.676 m)   Wt 194 lb 3.2 oz (88.1 kg)   BMI 31.34 kg/m  General:   Alert and oriented. No distress noted. Pleasant and cooperative.  Head:  Normocephalic and atraumatic. Eyes:  Conjuctiva clear without scleral icterus. Heart:  S1, S2  present without murmurs appreciated. Lungs:  Clear to auscultation bilaterally. No wheezes, rales, or rhonchi. No distress.  Abdomen:  +BS, soft, non-tender and non-distended. No rebound or guarding. No HSM or masses noted. Msk:  Symmetrical without gross deformities. Normal posture. Extremities:  Without edema. Neurologic:  Alert and  oriented x4 Psych:  Normal mood and affect.    Assessment: 70 year old female with history of multiple food and environmental allergies presenting today for follow-up of epigastric pain and chronic loose stools.  Celiac screen and H. pylori breath test negative in July 2022.  Recently completed EGD and colonoscopy in August which revealed nonbleeding internal hemorrhoids, 10 mm adenomatous colon polyp, gastritis (pathology with reactive gastropathy, negative for H. Pylori).  She was started on Prilosec 20 mg daily and advised to repeat colonoscopy in 3 years.  Today, patient states she is doing great after she stopped eating eggs.  All GI symptoms have resolved. States she knew eggs were an allergen for her, but she loved eggs.  No longer needing omeprazole.   Plan:  Next colonoscopy 2025.  Patient is on recall.   Follow-up PRN.   Aliene Altes, PA-C [facility] 08/27/2021

## 2021-08-27 ENCOUNTER — Encounter: Payer: Self-pay | Admitting: Gastroenterology

## 2021-08-27 ENCOUNTER — Other Ambulatory Visit: Payer: Self-pay

## 2021-08-27 ENCOUNTER — Ambulatory Visit (INDEPENDENT_AMBULATORY_CARE_PROVIDER_SITE_OTHER): Payer: Medicare Other | Admitting: Gastroenterology

## 2021-08-27 ENCOUNTER — Encounter (HOSPITAL_COMMUNITY): Payer: Self-pay | Admitting: Physical Therapy

## 2021-08-27 ENCOUNTER — Ambulatory Visit (HOSPITAL_COMMUNITY): Payer: Medicare Other | Attending: Student | Admitting: Physical Therapy

## 2021-08-27 VITALS — BP 109/77 | HR 79 | Temp 96.9°F | Ht 66.0 in | Wt 194.2 lb

## 2021-08-27 DIAGNOSIS — R1013 Epigastric pain: Secondary | ICD-10-CM | POA: Diagnosis not present

## 2021-08-27 DIAGNOSIS — R42 Dizziness and giddiness: Secondary | ICD-10-CM | POA: Insufficient documentation

## 2021-08-27 DIAGNOSIS — R197 Diarrhea, unspecified: Secondary | ICD-10-CM

## 2021-08-27 NOTE — Patient Instructions (Signed)
Access Code: [email or web address] URL: [email or web address] Date: 08/27/2021 Prepared by: Josue Hector  Exercises Seated Cervical Retraction - 3 x daily - 7 x weekly - 2 sets - 10 reps Seated Upper Trapezius Stretch - 3 x daily - 7 x weekly - 1 sets - 3 reps - 30 second hold

## 2021-08-27 NOTE — Therapy (Signed)
[facility] [address] [place], [place], [address] Phone: [phone]   Fax:  [phone]  Physical Therapy Evaluation  Patient Details  Name: Jenna Smith MRN: [id number] Date of Birth: 1951/05/30 Referring Provider (PT): Hildred Priest NP   Encounter Date: 08/27/2021   PT End of Session - 08/27/21 1004     Visit Number 1    Number of Visits 2    Date for PT Re-Evaluation 09/10/21    Authorization Type Generic Commercial/ UHC Medicare 2ndary    PT Start Time 0953    PT Stop Time 1030    PT Time Calculation (min) 37 min    Activity Tolerance Patient tolerated treatment well    Behavior During Therapy [facility] for tasks assessed/performed             Past Medical History:  Diagnosis Date   Allergy    multiple food and environmental allergies   Anemia 12/22/2017   Arthritis     Past Surgical History:  Procedure Laterality Date   BREAST SURGERY Bilateral May 2001   Breast reduction   COLONOSCOPY WITH PROPOFOL N/A 05/29/2021   Procedure: COLONOSCOPY WITH PROPOFOL;  Surgeon: Lanelle Bal, DO;  [facility];  Service: Endoscopy;  Laterality: N/A;  11:30am   COSMETIC SURGERY  September 2012   Face lift   ESOPHAGOGASTRODUODENOSCOPY (EGD) WITH PROPOFOL N/A 05/29/2021   Procedure: ESOPHAGOGASTRODUODENOSCOPY (EGD) WITH PROPOFOL;  Surgeon: Lanelle Bal, DO;  [facility];  Service: Endoscopy;  Laterality: N/A;   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   Procedure: RIGHT TOTAL KNEE ARTHROPLASTY;  Surgeon: Ranee Gosselin, MD;  Location: [facility];  Service: Orthopedics;  Laterality: Right;    There were no vitals filed for this visit.    Subjective Assessment - 08/27/21 1004     Subjective Patient presents to therapy with complaint of chronic vertigo. She is previously known to this clinic. She states most recent flare up is related to loud noises, in  particular her daughter in law talks loudly which has been making her feel dizzy. She says this has also brought about some dizziness with turning LT in bed and she would like to check things before they "get out of hand".    Pertinent History OA/ TKR    Limitations House hold activities    Patient Stated Goals "check thing out"                [facility] PT Assessment - 08/27/21 0001       Assessment   Medical Diagnosis vertigo    Referring Provider (PT) Hildred Priest NP    Prior Therapy Yes      Precautions   Precautions None      Restrictions   Weight Bearing Restrictions No      Balance Screen   Has the patient fallen in the past 6 months No      Home Environment   Living Environment Private residence      Prior Function   Level of Independence Independent      Cognition   Overall Cognitive Status Within Functional Limits for tasks assessed      Palpation   Palpation comment Mod TTP about RT levator and suboccipitals                    Vestibular Assessment - 08/27/21 0001  Oculomotor Exam   Smooth Pursuits Intact      Positional Testing   Dix-Hallpike Dix-Hallpike Right;Dix-Hallpike Left      Dix-Hallpike Right   Dix-Hallpike Right Symptoms No nystagmus      Dix-Hallpike Left   Dix-Hallpike Left Symptoms No nystagmus                Objective measurements completed on examination: See above findings.       [facility] Adult PT Treatment/Exercise - 08/27/21 0001       Exercises   Exercises Neck      Neck Exercises: Seated   Other Seated Exercise upper trap stretch, chin tuck x 5                       PT Short Term Goals - 08/27/21 1027       PT SHORT TERM GOAL #1   Title Patient will be independent with HEP and self-management strategies to improve functional outcomes    Time 2    Period Weeks    Status New    Target Date 09/10/21                        Plan - 08/27/21 1022     Clinical  Impression Statement Patient is a 70 yo Female who presents to therapy with complaint of vertigo which is negatively impacting daily function. Testing for BPPV negative. Did note increased TTP about cervical and scapular musculature. Dizziness possibly cervicogenic in nature. Discussed environmental modification for noise levels. Educated patient on, and issued, HEP for neck. Plan to follow up in about 2 weeks and reassess symptoms. Progress neck exercises as indicated and DC to HEP.    Personal Factors and Comorbidities Past/Current Experience    Examination-Activity Limitations Other;Stand;Locomotion Level;Sleep    Examination-Participation Restrictions Yard Work;Community Activity;Cleaning    Stability/Clinical Decision Making Stable/Uncomplicated    Clinical Decision Making Low    Rehab Potential Good    PT Frequency 1x / week    PT Duration --   1 x follow up   PT Treatment/Interventions Patient/family education;Therapeutic exercise;Other (comment);Manual techniques;ADLs/Self Care Home Management;Biofeedback;Canalith Repostioning;Electrical Stimulation;Therapeutic activities;Neuromuscular re-education;Balance training;Orthotic Fit/Training;Splinting;Spinal Manipulations;Passive range of motion;Taping;Vasopneumatic Device;Joint Manipulations;Vestibular;Visual/perceptual remediation/compensation    PT Next Visit Plan Review HEP, reassess symptoms. 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

## 2021-08-27 NOTE — Patient Instructions (Signed)
I am glad you are doing well!  Continue to avoid eggs.   You will be due for a colonoscopy in August 2025.  You will receive a letter in the mail when it is time to schedule.  We will follow-up with you as needed.  Do not hesitate to call if you have any new GI concerns.   It was great to see you again today! Hope you can get back to [place] soon!  Aliene Altes, PA-C [facility]

## 2021-09-10 ENCOUNTER — Encounter (HOSPITAL_COMMUNITY): Payer: PRIVATE HEALTH INSURANCE | Admitting: Physical Therapy

## 2021-09-25 ENCOUNTER — Encounter: Payer: Self-pay | Admitting: Family Medicine

## 2021-09-25 ENCOUNTER — Ambulatory Visit (INDEPENDENT_AMBULATORY_CARE_PROVIDER_SITE_OTHER): Payer: Medicare Other | Admitting: Family Medicine

## 2021-09-25 ENCOUNTER — Other Ambulatory Visit: Payer: Self-pay

## 2021-09-25 VITALS — BP 118/80 | HR 73 | Temp 97.5°F | Ht 66.0 in | Wt 196.0 lb

## 2021-09-25 DIAGNOSIS — F5102 Adjustment insomnia: Secondary | ICD-10-CM | POA: Diagnosis not present

## 2021-09-25 DIAGNOSIS — M79602 Pain in left arm: Secondary | ICD-10-CM

## 2021-09-25 DIAGNOSIS — M79601 Pain in right arm: Secondary | ICD-10-CM | POA: Diagnosis not present

## 2021-09-25 DIAGNOSIS — R202 Paresthesia of skin: Secondary | ICD-10-CM | POA: Diagnosis not present

## 2021-09-25 DIAGNOSIS — H6593 Unspecified nonsuppurative otitis media, bilateral: Secondary | ICD-10-CM | POA: Diagnosis not present

## 2021-09-25 DIAGNOSIS — E669 Obesity, unspecified: Secondary | ICD-10-CM | POA: Diagnosis not present

## 2021-09-25 DIAGNOSIS — G44209 Tension-type headache, unspecified, not intractable: Secondary | ICD-10-CM | POA: Diagnosis not present

## 2021-09-25 DIAGNOSIS — M542 Cervicalgia: Secondary | ICD-10-CM | POA: Diagnosis not present

## 2021-09-25 NOTE — Progress Notes (Signed)
Subjective:  Patient ID: Jenna Smith, female    DOB: 1951/09/20, 70 y.o.   MRN: [id number]  Patient Care Team: Raliegh Ip, DO as PCP - General (Family Medicine) Lanelle Bal, DO as Consulting Physician (Internal Medicine)   Chief Complaint:  Headache (Patient states pain is not present today. She would like a work up to make sure everything is okay)   HPI: Jenna Smith is a 70 y.o. female presenting on 09/25/2021 for Headache (Patient states pain is not present today. She would like a work up to make sure everything is okay)   Pt reports yesterday she had a brief headache accompanied by neck shoulder pain which has resolved. She denies fever, chills, weakness, focal neurological deficits, or syncope. No recent injury. She states she has also gained 30 pounds over the last 4 months without changes in diet or activity level. Reports intermittent insomnia over the last several months. She has ongoing paresthesias of arms and legs with arthralgias. Has been taking OTC vitamins and minerals with some relief of arthralgias.  Headache  This is a recurrent problem. The current episode started yesterday. The problem has been resolved. The pain is located in the Occipital, parietal and temporal region. The pain does not radiate. The quality of the pain is described as shooting. The pain is at a severity of 5/10. The pain is moderate. Associated symptoms include back pain, dizziness, insomnia and neck pain. Pertinent negatives include no abdominal pain, abnormal behavior, anorexia, blurred vision, coughing, drainage, ear pain, eye pain, eye redness, eye watering, facial sweating, fever, hearing loss, loss of balance, nausea, numbness, phonophobia, photophobia, rhinorrhea, scalp tenderness, seizures, sinus pressure, sore throat, swollen glands, tingling, tinnitus, visual change, vomiting, weakness or weight loss. Nothing aggravates the symptoms. She has tried nothing for the  symptoms.   Relevant past medical, surgical, family, and social history reviewed and updated as indicated.  Allergies and medications reviewed and updated. Data reviewed: Chart in Epic.   Past Medical History:  Diagnosis Date   Allergy    multiple food and environmental allergies   Anemia 12/22/2017   Arthritis     Past Surgical History:  Procedure Laterality Date   BREAST SURGERY Bilateral 02/2000   Breast reduction   COLONOSCOPY WITH PROPOFOL N/A 05/29/2021   Surgeon: Earnest Bailey K, DO; Nonbleeding internal hemorrhoids, 10 mm polyp in the cecum.  Pathology revealed tubular adenoma.  Recommended repeat colonoscopy in 3 years.   COSMETIC SURGERY  06/2011   Face lift   ESOPHAGOGASTRODUODENOSCOPY (EGD) WITH PROPOFOL N/A 05/29/2021   Surgeon: Lanelle Bal, DO; Gastritis biopsied, normal esophagus and duodenum. Pathology with reactive gastropathy, negative for H. pylori.   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   Procedure: RIGHT TOTAL KNEE ARTHROPLASTY;  Surgeon: Ranee Gosselin, MD;  Location: [facility];  Service: Orthopedics;  Laterality: Right;    Social History   Socioeconomic History   Marital status: Married    Spouse name: Brett Canales   Number of children: 1   Years of education: 13   Highest education level: Some college, no degree  Occupational History   Occupation: retired  Tobacco Use   Smoking status: Former    Packs/day: 0.30    Years: 15.00    Pack years: 4.50    Types: Cigarettes    Quit date: 10/28/1984    Years since quitting: 36.9   Smokeless tobacco: Never  Tobacco comments:    smoked "off and on" x 15 yrs  Vaping Use   Vaping Use: Never used  Substance and Sexual Activity   Alcohol use: Not Currently    Comment: Holidays - occasional wine   Drug use: No   Sexual activity: Yes    Birth control/protection: Post-menopausal  Other Topics Concern   Not on file  Social History Narrative   Not on file    Social Determinants of Health   Financial Resource Strain: Low Risk    Difficulty of Paying Living Expenses: Not hard at all  Food Insecurity: No Food Insecurity   Worried About Programme researcher, broadcasting/film/video in the Last Year: Never true   Ran Out of Food in the Last Year: Never true  Transportation Needs: No Transportation Needs   Lack of Transportation (Medical): No   Lack of Transportation (Non-Medical): No  Physical Activity: Sufficiently Active   Days of Exercise per Week: 4 days   Minutes of Exercise per Session: 60 min  Stress: No Stress Concern Present   Feeling of Stress : Not at all  Social Connections: Moderately Integrated   Frequency of Communication with Friends and Family: More than three times a week   Frequency of Social Gatherings with Friends and Family: More than three times a week   Attends Religious Services: Never   Database administrator or Organizations: Yes   Attends Engineer, structural: More than 4 times per year   Marital Status: Married  Catering manager Violence: Not At Risk   Fear of Current or Ex-Partner: No   Emotionally Abused: No   Physically Abused: No   Sexually Abused: No    Outpatient Encounter Medications as of 09/25/2021  Medication Sig   acetaminophen (TYLENOL) 650 MG CR tablet Take 650 mg by mouth every 8 (eight) hours as needed for pain.   Biotin w/ Vitamins C & E (HAIR SKIN & NAILS GUMMIES PO) Take 2 each by mouth daily.   Calcium Carbonate-Vit D-Min (CALCIUM 1200) 1200-1000 MG-UNIT CHEW Chew 2 tablets by mouth daily.   cetirizine (ZYRTEC) 10 MG tablet Take 10 mg by mouth daily as needed for allergies.   Cholecalciferol (VITAMIN D3) 250 MCG (10000 UT) capsule Take 10,000 Units by mouth daily.   Magnesium Gluconate 550 MG TABS    Omega-3 Fatty Acids (FISH OIL) 1000 MG CAPS Take 2,000 mg by mouth in the morning, at noon, and at bedtime. Relief Factor   OVER THE COUNTER MEDICATION Take 1 tablet by mouth daily. Zinc 100mg  Vitamin C  1000mg  Vitamin d3 1000 IU daily   OVER THE COUNTER MEDICATION Take 1 tablet by mouth daily. Magnesium 375 Potassium 450   No facility-administered encounter medications on file as of 09/25/2021.    Allergies  Allergen Reactions   Bactrim [Sulfamethoxazole-Trimethoprim] Shortness Of Breath   Bee Venom Anaphylaxis   Prednisone     Pt states she thinks this was due to her taking prednisone with her pain medications-has taken prednisone since and didn't have any problems   Indomethacin Swelling    Face swells up   Avocado Hives   Bacitracin    Latex Hives   Neosporin [Neomycin-Bacitracin Zn-Polymyx] Hives   Other     Numerous food allergies: Broccoli, buckwheat, cabbage, cantaloupe, cinnamon, corn, polyps, lumen, molds, mushroom, black pepper, green pepper, pineapple, sweet potato, spinach, tomato, whole-wheat, whole egg, [place] meat, cow milk, crab, lobster, salmon, shellfish mix, coconut, black walnut.  *Patient states she continues  to eat essentially all of these items.   Penicillins Hives    Has patient had a PCN reaction causing immediate rash, facial/tongue/throat swelling, SOB or lightheadedness with hypotension: no Has patient had a PCN reaction causing severe rash involving mucus membranes or skin necrosis: no Has patient had a PCN reaction that required hospitalization: no Has patient had a PCN reaction occurring within the last 10 years: no If all of the above answers are "NO", then may proceed with Cephalosporin use.    Polymyxin B    Pomegranate (Punica Granatum) Hives    Review of Systems  Constitutional:  Positive for fatigue and unexpected weight change (30 lb weight gain over 4 months). Negative for activity change, appetite change, chills, diaphoresis, fever and weight loss.  HENT: Negative.  Negative for congestion, dental problem, drooling, ear discharge, ear pain, facial swelling, hearing loss, mouth sores, nosebleeds, postnasal drip, rhinorrhea, sinus pressure,  sinus pain, sneezing, sore throat, tinnitus, trouble swallowing and voice change.   Eyes: Negative.  Negative for blurred vision, photophobia, pain and redness.  Respiratory:  Negative for cough, chest tightness and shortness of breath.   Cardiovascular:  Negative for chest pain, palpitations and leg swelling.  Gastrointestinal:  Negative for abdominal pain, anorexia, blood in stool, constipation, diarrhea, nausea and vomiting.  Endocrine: Negative.   Genitourinary:  Negative for decreased urine volume, difficulty urinating, dysuria, frequency and urgency.  Musculoskeletal:  Positive for arthralgias, back pain, myalgias and neck pain. Negative for gait problem, joint swelling and neck stiffness.  Skin: Negative.   Allergic/Immunologic: Negative.   Neurological:  Positive for dizziness and headaches. Negative for tingling, tremors, seizures, syncope, facial asymmetry, speech difficulty, weakness, light-headedness, numbness and loss of balance.  Hematological: Negative.   Psychiatric/Behavioral:  Negative for confusion, hallucinations, sleep disturbance and suicidal ideas. The patient has insomnia.   All other systems reviewed and are negative.      Objective:  BP 118/80   Pulse 73   Temp (!) 97.5 F (36.4 C)   Ht 5\' 6"  (1.676 m)   Wt 196 lb (88.9 kg)   SpO2 97%   BMI 31.64 kg/m    Wt Readings from Last 3 Encounters:  09/25/21 196 lb (88.9 kg)  08/27/21 194 lb 3.2 oz (88.1 kg)  07/31/21 191 lb (86.6 kg)    Physical Exam Vitals and nursing note reviewed.  Constitutional:      General: She is not in acute distress.    Appearance: Normal appearance. She is well-developed and well-groomed. She is obese. She is not ill-appearing, toxic-appearing or diaphoretic.  HENT:     Head: Normocephalic and atraumatic.     Jaw: There is normal jaw occlusion.     Right Ear: Hearing, ear canal and external ear normal. A middle ear effusion is present. Tympanic membrane is not perforated or  erythematous.     Left Ear: Hearing, ear canal and external ear normal. A middle ear effusion is present. Tympanic membrane is not perforated or erythematous.     Nose: Nose normal.     Mouth/Throat:     Lips: Pink.     Mouth: Mucous membranes are moist.     Pharynx: Oropharynx is clear. Uvula midline.  Eyes:     General: Lids are normal. No visual field deficit.    Extraocular Movements: Extraocular movements intact.     Right eye: Normal extraocular motion and no nystagmus.     Left eye: Normal extraocular motion and no nystagmus.  Conjunctiva/sclera: Conjunctivae normal.     Pupils: Pupils are equal, round, and reactive to light.  Neck:     Thyroid: No thyroid mass, thyromegaly or thyroid tenderness.     Vascular: No carotid bruit or JVD.     Trachea: Trachea and phonation normal.  Cardiovascular:     Rate and Rhythm: Normal rate and regular rhythm.     Chest Wall: PMI is not displaced.     Pulses: Normal pulses.     Heart sounds: Normal heart sounds. No murmur heard.   No friction rub. No gallop.  Pulmonary:     Effort: Pulmonary effort is normal. No respiratory distress.     Breath sounds: Normal breath sounds. No wheezing.  Abdominal:     General: Bowel sounds are normal. There is no distension or abdominal bruit.     Palpations: Abdomen is soft. There is no hepatomegaly or splenomegaly.     Tenderness: There is no abdominal tenderness. There is no right CVA tenderness or left CVA tenderness.     Hernia: No hernia is present.  Musculoskeletal:        General: Normal range of motion.     Right shoulder: Normal.     Left shoulder: Normal.     Cervical back: Normal, normal range of motion and neck supple.     Thoracic back: Normal.     Lumbar back: Normal.       Back:     Right lower leg: No edema.     Left lower leg: No edema.  Lymphadenopathy:     Cervical: No cervical adenopathy.  Skin:    General: Skin is warm and dry.     Capillary Refill: Capillary refill  takes less than 2 seconds.     Coloration: Skin is not cyanotic, jaundiced or pale.     Findings: No rash.  Neurological:     General: No focal deficit present.     Mental Status: She is alert and oriented to person, place, and time.     GCS: GCS eye subscore is 4. GCS verbal subscore is 5. GCS motor subscore is 6.     Cranial Nerves: No cranial nerve deficit, dysarthria or facial asymmetry.     Sensory: Sensation is intact. No sensory deficit.     Motor: Motor function is intact. No weakness.     Coordination: Coordination is intact. Romberg sign negative. Coordination normal.     Gait: Gait is intact. Gait normal.     Deep Tendon Reflexes: Reflexes are normal and symmetric. Reflexes normal.  Psychiatric:        Attention and Perception: Attention and perception normal.        Mood and Affect: Mood and affect normal.        Speech: Speech normal.        Behavior: Behavior normal. Behavior is cooperative.        Thought Content: Thought content normal.        Cognition and Memory: Cognition and memory normal.        Judgment: Judgment normal.    Results for orders placed or performed during the hospital encounter of 05/29/21  Surgical pathology  Result Value Ref Range   SURGICAL PATHOLOGY      SURGICAL PATHOLOGY CASE: [id number] PATIENT: [facility] Surgical Pathology Report     Clinical History: colon cancer screening, epigastric pain     FINAL MICROSCOPIC DIAGNOSIS:  A. STOMACH, BIOPSY: - Reactive gastropathy. - Warthin-Starry is negative  for Helicobacter pylori. - No intestinal metaplasia, dysplasia, or malignancy.  B. COLON, CECAL, POLYPECTOMY: - Tubular adenoma (x2 fragments). - No high grade dysplasia or malignancy.   GROSS DESCRIPTION:  A.  Received in formalin labeled with the patients name and DOB are four 0.2-0.4 cm pieces of tan soft tissue, submitted in toto in a single cassette.  B.  Received in formalin labeled with the patient's name  and DOB are two 0.3-0.9 cm tan, rubbery, polypoid pieces of tissue. The larger piece is sectioned and the tissue is entirely submitted in a single cassette.  (LEF 05/29/2021)    Final Diagnosis performed by Valinda Hoar, MD.   Electronically signed 05/30/2021 Technical component perfo rmed at [facility], [address], [place], [place] [address].  Professional component performed at [facility], [address] [place], [place] [address].  Immunohistochemistry Technical component (if applicable) was performed at [facility]. [address], [address], [place], [place] [address].   IMMUNOHISTOCHEMISTRY DISCLAIMER (if applicable): Some of these immunohistochemical stains may have been developed and the performance characteristics determine by [facility]. Some may not have been cleared or approved by the U.S. Food and Drug Administration. The FDA has determined that such clearance or approval is not necessary. This test is used for clinical purposes. It should not be regarded as investigational or for research. This laboratory is certified under the Clinical Laboratory Improvement Amendments of 1988 (CLIA-88) as qualified to perform high complexity clinical laboratory testing.  The  controls stained appropriately.        Pertinent labs & imaging results that were available during my care of the patient were reviewed by me and considered in my medical decision making.  Assessment & Plan:  Cleda was seen today for headache.  Diagnoses and all orders for this visit:  Tension headache Neck discomfort Completely resolved today. MRI from 10/2020 reviewed. Report any return of symptoms or new or worsening symptoms. Neurological exam unremarkable in office today.   Obesity (BMI 30-39.9) Diet and exercise encouraged. Will check labs today.  -     CBC with Differential/Platelet -     CMP14+EGFR -     Thyroid  Panel With TSH  Paresthesia and pain of both upper extremities Will check below labs for potential causes.  -     CBC with Differential/Platelet -     CMP14+EGFR -     Vitamin B12 -     VITAMIN D 25 Hydroxy (Vit-D Deficiency, Fractures)  Fluid level behind tympanic membrane of both ears Recommended Flonase and antihistamine.   Insomnia, transient  Labs pending. Melatonin recommended.    Continue all other maintenance medications.  Follow up plan: Return if symptoms worsen or fail to improve.   Continue healthy lifestyle choices, including diet (rich in fruits, vegetables, and lean proteins, and low in salt and simple carbohydrates) and exercise (at least 30 minutes of moderate physical activity daily).  Educational handout given for tension headache  The above assessment and management plan was discussed with the patient. The patient verbalized understanding of and has agreed to the management plan. Patient is aware to call the clinic if they develop any new symptoms or if symptoms persist or worsen. Patient is aware when to return to the clinic for a follow-up visit. Patient educated on when it is appropriate to go to the emergency department.   Kari Baars, FNP-C [facility] [phone]

## 2021-09-26 LAB — CMP14+EGFR
ALT: 23 IU/L (ref 0–32)
AST: 23 IU/L (ref 0–40)
Albumin/Globulin Ratio: 1.8 (ref 1.2–2.2)
Albumin: 4.4 g/dL (ref 3.8–4.8)
Alkaline Phosphatase: 128 IU/L — ABNORMAL HIGH (ref 44–121)
BUN/Creatinine Ratio: 20 (ref 12–28)
BUN: 19 mg/dL (ref 8–27)
Bilirubin Total: 0.3 mg/dL (ref 0.0–1.2)
CO2: 24 mmol/L (ref 20–29)
Calcium: 9.5 mg/dL (ref 8.7–10.3)
Chloride: 104 mmol/L (ref 96–106)
Creatinine, Ser: 0.96 mg/dL (ref 0.57–1.00)
Globulin, Total: 2.4 g/dL (ref 1.5–4.5)
Glucose: 86 mg/dL (ref 70–99)
Potassium: 4.8 mmol/L (ref 3.5–5.2)
Sodium: 145 mmol/L — ABNORMAL HIGH (ref 134–144)
Total Protein: 6.8 g/dL (ref 6.0–8.5)
eGFR: 64 mL/min/{1.73_m2} (ref >59)

## 2021-09-26 LAB — CBC WITH DIFFERENTIAL/PLATELET
Basophils Absolute: 0 10*3/uL (ref 0.0–0.2)
Basos: 0 %
EOS (ABSOLUTE): 0.2 10*3/uL (ref 0.0–0.4)
Eos: 2 %
Hematocrit: 43.2 % (ref 34.0–46.6)
Hemoglobin: 14.2 g/dL (ref 11.1–15.9)
Immature Grans (Abs): 0.1 10*3/uL (ref 0.0–0.1)
Immature Granulocytes: 1 %
Lymphocytes Absolute: 2.6 10*3/uL (ref 0.7–3.1)
Lymphs: 35 %
MCH: 28.6 pg (ref 26.6–33.0)
MCHC: 32.9 g/dL (ref 31.5–35.7)
MCV: 87 fL (ref 79–97)
Monocytes Absolute: 0.6 10*3/uL (ref 0.1–0.9)
Monocytes: 8 %
Neutrophils Absolute: 3.9 10*3/uL (ref 1.4–7.0)
Neutrophils: 54 %
Platelets: 259 10*3/uL (ref 150–450)
RBC: 4.97 x10E6/uL (ref 3.77–5.28)
RDW: 14.4 % (ref 11.7–15.4)
WBC: 7.3 10*3/uL (ref 3.4–10.8)

## 2021-09-26 LAB — THYROID PANEL WITH TSH
Free Thyroxine Index: 2.4 (ref 1.2–4.9)
T3 Uptake Ratio: 28 % (ref 24–39)
T4, Total: 8.5 ug/dL (ref 4.5–12.0)
TSH: 2.54 u[IU]/mL (ref 0.450–4.500)

## 2021-09-26 LAB — VITAMIN D 25 HYDROXY (VIT D DEFICIENCY, FRACTURES): Vit D, 25-Hydroxy: 54.3 ng/mL (ref 30.0–100.0)

## 2021-09-26 LAB — VITAMIN B12: Vitamin B-12: 673 pg/mL (ref 232–1245)

## 2021-09-28 ENCOUNTER — Other Ambulatory Visit: Payer: Self-pay

## 2021-09-28 ENCOUNTER — Ambulatory Visit
Admission: RE | Admit: 2021-09-28 | Discharge: 2021-09-28 | Disposition: A | Discharge status: Home / Self Care | Payer: Medicare Other | Source: Ambulatory Visit | Attending: Family Medicine | Admitting: Family Medicine

## 2021-09-28 DIAGNOSIS — Z1231 Encounter for screening mammogram for malignant neoplasm of breast: Secondary | ICD-10-CM

## 2021-10-03 ENCOUNTER — Ambulatory Visit (INDEPENDENT_AMBULATORY_CARE_PROVIDER_SITE_OTHER): Payer: PRIVATE HEALTH INSURANCE | Admitting: Plastic Surgery

## 2021-10-03 ENCOUNTER — Encounter: Payer: Self-pay | Admitting: Plastic Surgery

## 2021-10-03 ENCOUNTER — Other Ambulatory Visit: Payer: Self-pay

## 2021-10-03 DIAGNOSIS — G8929 Other chronic pain: Secondary | ICD-10-CM

## 2021-10-03 DIAGNOSIS — M542 Cervicalgia: Secondary | ICD-10-CM

## 2021-10-03 DIAGNOSIS — M549 Dorsalgia, unspecified: Secondary | ICD-10-CM | POA: Insufficient documentation

## 2021-10-03 DIAGNOSIS — M546 Pain in thoracic spine: Secondary | ICD-10-CM

## 2021-10-03 DIAGNOSIS — N62 Hypertrophy of breast: Secondary | ICD-10-CM | POA: Diagnosis not present

## 2021-10-03 NOTE — Progress Notes (Signed)
Patient ID: Jenna Smith, female    DOB: 01-12-1951, 70 y.o.   MRN: [id number]   Chief Complaint  Patient presents with   Consult        Breast Problem    Mammary Hyperplasia: The patient is a 70 y.o. female with a history of mammary hyperplasia for several years.  She has extremely large breasts causing symptoms that include the following: Back pain in the upper and lower back, including neck pain. She pulls or pins her bra straps to provide better lift and relief of the pressure and pain. She notices relief by holding her breast up manually.  Her shoulder straps cause grooves and pain and pressure that requires padding for relief. Pain medication is sometimes required with motrin and tylenol.  Activities that are hindered by enlarged breasts include: exercise and running.  She has tried supportive clothing as well as fitted bras without improvement.  Her breasts are extremely large and fairly symmetric.  She has hyperpigmentation of the inframammary area on both sides.  The sternal to nipple distance on the right is 27 cm and the left is 27 cm.  The IMF distance is 12 cm.  She is 5 feet 6 inches tall and weighs 195 pounds.  The BMI = 31.5 kg/m.  Preoperative bra size = DD cup. She would like to be a B cup. The estimated excess breast tissue to be removed at the time of surgery = 350 grams on the left and 3550 grams on the right.  Mammogram history: 12/22 and negative.  Family history of breast cancer:  sister.  Tobacco use: former and quit several years ago.   The patient expresses the desire to pursue surgical intervention.  She had a breast reduction by a surgeon in [facility] 20 years ago.  She will find the paperwork and bring [place] a copy.  She sees a [facility] and massage therapist regularly for her back pain.  Her scars are well healed and include the areola, vertical limb and inframammary fold.   Review of Systems  Constitutional: Negative.   HENT: Negative.    Eyes:  Negative.   Respiratory: Negative.  Negative for chest tightness.   Cardiovascular: Negative.  Negative for leg swelling.  Gastrointestinal: Negative.   Endocrine: Negative.   Genitourinary: Negative.   Musculoskeletal:  Positive for back pain and neck pain.  Skin: Negative.  Negative for wound.  Hematological: Negative.   Psychiatric/Behavioral: Negative.     Past Medical History:  Diagnosis Date   Allergy    multiple food and environmental allergies   Anemia 12/22/2017   Arthritis     Past Surgical History:  Procedure Laterality Date   BREAST SURGERY Bilateral 02/2000   Breast reduction   COLONOSCOPY WITH PROPOFOL N/A 05/29/2021   Surgeon: Earnest Bailey K, DO; Nonbleeding internal hemorrhoids, 10 mm polyp in the cecum.  Pathology revealed tubular adenoma.  Recommended repeat colonoscopy in 3 years.   COSMETIC SURGERY  06/2011   Face lift   ESOPHAGOGASTRODUODENOSCOPY (EGD) WITH PROPOFOL N/A 05/29/2021   Surgeon: Lanelle Bal, DO; Gastritis biopsied, normal esophagus and duodenum. Pathology with reactive gastropathy, negative for H. pylori.   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   Procedure: RIGHT TOTAL KNEE ARTHROPLASTY;  Surgeon: Ranee Gosselin, MD;  Location: [facility];  Service: Orthopedics;  Laterality: Right;      Current Outpatient Medications:    acetaminophen (TYLENOL) 650  MG CR tablet, Take 650 mg by mouth every 8 (eight) hours as needed for pain., Disp: , Rfl:    Biotin w/ Vitamins C & E (HAIR SKIN & NAILS GUMMIES PO), Take 2 each by mouth daily., Disp: , Rfl:    Calcium Carbonate-Vit D-Min (CALCIUM 1200) 1200-1000 MG-UNIT CHEW, Chew 2 tablets by mouth daily., Disp: , Rfl:    cetirizine (ZYRTEC) 10 MG tablet, Take 10 mg by mouth daily as needed for allergies., Disp: , Rfl:    Cholecalciferol (VITAMIN D3) 250 MCG (10000 UT) capsule, Take 10,000 Units by mouth daily., Disp: , Rfl:    Magnesium Gluconate 550 MG  TABS, , Disp: , Rfl:    Omega-3 Fatty Acids (FISH OIL) 1000 MG CAPS, Take 2,000 mg by mouth in the morning, at noon, and at bedtime. Relief Factor, Disp: , Rfl:    OVER THE COUNTER MEDICATION, Take 1 tablet by mouth daily. Zinc 100mg  Vitamin C 1000mg  Vitamin d3 1000 IU daily, Disp: , Rfl:    OVER THE COUNTER MEDICATION, Take 1 tablet by mouth daily. Magnesium 375 Potassium 450, Disp: , Rfl:    Objective:   Vitals:   10/03/21 1128  BP: 131/85  Pulse: 72  SpO2: 95%    Physical Exam Vitals and nursing note reviewed.  Constitutional:      Appearance: Normal appearance.  HENT:     Head: Normocephalic and atraumatic.  Cardiovascular:     Rate and Rhythm: Normal rate.     Pulses: Normal pulses.  Pulmonary:     Effort: Pulmonary effort is normal.  Abdominal:     General: There is no distension.     Palpations: Abdomen is soft.     Tenderness: There is no abdominal tenderness.  Musculoskeletal:        General: No swelling or deformity.  Skin:    General: Skin is warm.     Capillary Refill: Capillary refill takes less than 2 seconds.     Coloration: Skin is not jaundiced.     Findings: No bruising or lesion.  Neurological:     Mental Status: She is alert and oriented to person, place, and time.  Psychiatric:        Mood and Affect: Mood normal.        Behavior: Behavior normal.        Thought Content: Thought content normal.    Assessment & Plan:  Symptomatic mammary hypertrophy  Chronic bilateral thoracic back pain  Neck pain  The procedure the patient selected and that was best for the patient was discussed. The risk were discussed and include but not limited to the following:  Breast asymmetry, fluid accumulation, firmness of the breast, inability to breast feed, loss of nipple or areola, skin loss, change in skin and nipple sensation, fat necrosis of the breast tissue, bleeding, infection and healing delay.  There are risks of anesthesia and injury to nerves or blood  vessels.  Allergic reaction to tape, suture and skin glue are possible.  There will be swelling.  Any of these can lead to the need for revisional surgery.  A breast reduction has potential to interfere with diagnostic procedures in the future.  This procedure is best done when the breast is fully developed.  Changes in the breast will continue to occur over time: pregnancy, weight gain or weigh loss.    Total time: 40 minutes. This includes time spent with the patient during the visit as well as time spent before and  after the visit reviewing the chart, documenting the encounter and ordering pertinent studies. and literature emailed to the patient.   Physical therapy:  ordered Mammogram:  done and negative  Recommend bilateral breast reduction with possible liposuction. Patient knows to call in January.  Pictures were obtained of the patient and placed in the chart with the patient's or guardian's permission.   Alena Bills Briea Mcenery, DO

## 2021-10-04 ENCOUNTER — Telehealth: Payer: Self-pay | Admitting: Family Medicine

## 2021-10-04 ENCOUNTER — Telehealth: Payer: Self-pay

## 2021-10-04 DIAGNOSIS — M9903 Segmental and somatic dysfunction of lumbar region: Secondary | ICD-10-CM | POA: Diagnosis not present

## 2021-10-04 DIAGNOSIS — M9901 Segmental and somatic dysfunction of cervical region: Secondary | ICD-10-CM | POA: Diagnosis not present

## 2021-10-04 DIAGNOSIS — M9902 Segmental and somatic dysfunction of thoracic region: Secondary | ICD-10-CM | POA: Diagnosis not present

## 2021-10-04 DIAGNOSIS — M62830 Muscle spasm of back: Secondary | ICD-10-CM | POA: Diagnosis not present

## 2021-10-04 NOTE — Telephone Encounter (Signed)
Patient called to say she has decided not to have surgery because her husband is nervous about it.  She said they had a friend about their age who went in for surgery and didn't come out.  Patient said it was a pleasure meeting Dr. Marla Roe and everyone was great here.

## 2021-10-04 NOTE — Telephone Encounter (Signed)
REFERRAL REQUEST Telephone Note  Have you been seen at our office for this problem? no (Advise that they may need an appointment with their PCP before a referral can be done)  Reason for Referral: spot found by Breast Reduction Doctor yesterday that she was referred to by Dr Lajuana Ripple Referral discussed with patient: by other doctor  Best contact number of patient for referral team: [phone]    Has patient been seen by a specialist for this issue before: yes, over 20 years ago  Patient provider preference for referral: none Patient location preference for referral: [place]   Patient notified that referrals can take up to a week or longer to process. If they haven't heard anything within a week they should call back and speak with the referral department.    [facility] pt.  Please call pt.

## 2021-10-08 ENCOUNTER — Other Ambulatory Visit: Payer: Self-pay | Admitting: Family Medicine

## 2021-10-08 DIAGNOSIS — L988 Other specified disorders of the skin and subcutaneous tissue: Secondary | ICD-10-CM

## 2021-10-08 NOTE — Telephone Encounter (Signed)
done

## 2021-10-08 NOTE — Telephone Encounter (Signed)
Called patient and she states a "lesion or raised area" was found during her breast exam and she would like to be referred to dermatology in [place] if possible. She is concerned about skin cancer.

## 2021-10-10 ENCOUNTER — Telehealth: Payer: Self-pay | Admitting: Family Medicine

## 2021-10-10 DIAGNOSIS — M9903 Segmental and somatic dysfunction of lumbar region: Secondary | ICD-10-CM | POA: Diagnosis not present

## 2021-10-10 DIAGNOSIS — M9901 Segmental and somatic dysfunction of cervical region: Secondary | ICD-10-CM | POA: Diagnosis not present

## 2021-10-10 DIAGNOSIS — M9902 Segmental and somatic dysfunction of thoracic region: Secondary | ICD-10-CM | POA: Diagnosis not present

## 2021-10-10 DIAGNOSIS — M62830 Muscle spasm of back: Secondary | ICD-10-CM | POA: Diagnosis not present

## 2021-10-10 NOTE — Telephone Encounter (Signed)
REFERRAL REQUEST Telephone Note  Have you been seen at our office for this problem? YES (Advise that they may need an appointment with their PCP before a referral can be done)  Reason for Referral: Vertigo Referral discussed with patient: YES  Best contact number of patient for referral team: [phone]    Has patient been seen by a specialist for this issue before: YES  Patient provider preference for referral: Dr. Lysbeth Galas Patient location preference for referral: [place]   Patient notified that referrals can take up to a week or longer to process. If they haven't heard anything within a week they should call back and speak with the referral department.

## 2021-10-16 DIAGNOSIS — M9902 Segmental and somatic dysfunction of thoracic region: Secondary | ICD-10-CM | POA: Diagnosis not present

## 2021-10-16 DIAGNOSIS — M62830 Muscle spasm of back: Secondary | ICD-10-CM | POA: Diagnosis not present

## 2021-10-16 DIAGNOSIS — M9903 Segmental and somatic dysfunction of lumbar region: Secondary | ICD-10-CM | POA: Diagnosis not present

## 2021-10-16 DIAGNOSIS — M9901 Segmental and somatic dysfunction of cervical region: Secondary | ICD-10-CM | POA: Diagnosis not present

## 2021-10-31 DIAGNOSIS — M9903 Segmental and somatic dysfunction of lumbar region: Secondary | ICD-10-CM | POA: Diagnosis not present

## 2021-10-31 DIAGNOSIS — M62830 Muscle spasm of back: Secondary | ICD-10-CM | POA: Diagnosis not present

## 2021-10-31 DIAGNOSIS — M9902 Segmental and somatic dysfunction of thoracic region: Secondary | ICD-10-CM | POA: Diagnosis not present

## 2021-10-31 DIAGNOSIS — M9901 Segmental and somatic dysfunction of cervical region: Secondary | ICD-10-CM | POA: Diagnosis not present

## 2021-11-05 DIAGNOSIS — M62830 Muscle spasm of back: Secondary | ICD-10-CM | POA: Diagnosis not present

## 2021-11-05 DIAGNOSIS — M9902 Segmental and somatic dysfunction of thoracic region: Secondary | ICD-10-CM | POA: Diagnosis not present

## 2021-11-05 DIAGNOSIS — M9901 Segmental and somatic dysfunction of cervical region: Secondary | ICD-10-CM | POA: Diagnosis not present

## 2021-11-05 DIAGNOSIS — M9903 Segmental and somatic dysfunction of lumbar region: Secondary | ICD-10-CM | POA: Diagnosis not present

## 2021-11-27 ENCOUNTER — Institutional Professional Consult (permissible substitution): Payer: PRIVATE HEALTH INSURANCE | Admitting: Plastic Surgery

## 2021-11-28 DIAGNOSIS — M9903 Segmental and somatic dysfunction of lumbar region: Secondary | ICD-10-CM | POA: Diagnosis not present

## 2021-11-28 DIAGNOSIS — M62830 Muscle spasm of back: Secondary | ICD-10-CM | POA: Diagnosis not present

## 2021-11-28 DIAGNOSIS — M9901 Segmental and somatic dysfunction of cervical region: Secondary | ICD-10-CM | POA: Diagnosis not present

## 2021-11-28 DIAGNOSIS — M9902 Segmental and somatic dysfunction of thoracic region: Secondary | ICD-10-CM | POA: Diagnosis not present

## 2022-02-21 ENCOUNTER — Ambulatory Visit: Payer: Medicare Other

## 2022-02-21 ENCOUNTER — Ambulatory Visit (INDEPENDENT_AMBULATORY_CARE_PROVIDER_SITE_OTHER): Payer: Medicare Other | Admitting: Family Medicine

## 2022-02-21 ENCOUNTER — Ambulatory Visit (HOSPITAL_COMMUNITY)
Admission: RE | Admit: 2022-02-21 | Discharge: 2022-02-21 | Disposition: A | Discharge status: Home / Self Care | Payer: Medicare Other | Source: Ambulatory Visit | Attending: Family Medicine | Admitting: Family Medicine

## 2022-02-21 ENCOUNTER — Encounter: Payer: Self-pay | Admitting: Family Medicine

## 2022-02-21 VITALS — BP 114/77 | HR 74 | Temp 97.1°F | Ht 66.0 in | Wt 190.2 lb

## 2022-02-21 DIAGNOSIS — M542 Cervicalgia: Secondary | ICD-10-CM | POA: Diagnosis not present

## 2022-02-21 DIAGNOSIS — M62830 Muscle spasm of back: Secondary | ICD-10-CM | POA: Diagnosis not present

## 2022-02-21 DIAGNOSIS — S0990XA Unspecified injury of head, initial encounter: Secondary | ICD-10-CM

## 2022-02-21 DIAGNOSIS — M9901 Segmental and somatic dysfunction of cervical region: Secondary | ICD-10-CM | POA: Diagnosis not present

## 2022-02-21 DIAGNOSIS — M9902 Segmental and somatic dysfunction of thoracic region: Secondary | ICD-10-CM | POA: Diagnosis not present

## 2022-02-21 DIAGNOSIS — M9903 Segmental and somatic dysfunction of lumbar region: Secondary | ICD-10-CM | POA: Diagnosis not present

## 2022-02-21 MED ORDER — METHOCARBAMOL 500 MG PO TABS: 500.0000 mg | ORAL_TABLET | Freq: Three times a day (TID) | ORAL | 0 refills | Status: DC | PRN
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
## 2022-02-25 DIAGNOSIS — M9902 Segmental and somatic dysfunction of thoracic region: Secondary | ICD-10-CM | POA: Diagnosis not present

## 2022-02-25 DIAGNOSIS — M9901 Segmental and somatic dysfunction of cervical region: Secondary | ICD-10-CM | POA: Diagnosis not present

## 2022-02-25 DIAGNOSIS — M62830 Muscle spasm of back: Secondary | ICD-10-CM | POA: Diagnosis not present

## 2022-02-25 DIAGNOSIS — M9903 Segmental and somatic dysfunction of lumbar region: Secondary | ICD-10-CM | POA: Diagnosis not present

## 2022-02-26 DIAGNOSIS — M9902 Segmental and somatic dysfunction of thoracic region: Secondary | ICD-10-CM | POA: Diagnosis not present

## 2022-02-26 DIAGNOSIS — M9901 Segmental and somatic dysfunction of cervical region: Secondary | ICD-10-CM | POA: Diagnosis not present

## 2022-02-26 DIAGNOSIS — M62830 Muscle spasm of back: Secondary | ICD-10-CM | POA: Diagnosis not present

## 2022-02-26 DIAGNOSIS — M9903 Segmental and somatic dysfunction of lumbar region: Secondary | ICD-10-CM | POA: Diagnosis not present

## 2022-03-04 DIAGNOSIS — M9903 Segmental and somatic dysfunction of lumbar region: Secondary | ICD-10-CM | POA: Diagnosis not present

## 2022-03-04 DIAGNOSIS — M9901 Segmental and somatic dysfunction of cervical region: Secondary | ICD-10-CM | POA: Diagnosis not present

## 2022-03-04 DIAGNOSIS — M62830 Muscle spasm of back: Secondary | ICD-10-CM | POA: Diagnosis not present

## 2022-03-04 DIAGNOSIS — M9902 Segmental and somatic dysfunction of thoracic region: Secondary | ICD-10-CM | POA: Diagnosis not present

## 2022-03-07 DIAGNOSIS — M62830 Muscle spasm of back: Secondary | ICD-10-CM | POA: Diagnosis not present

## 2022-03-07 DIAGNOSIS — M9902 Segmental and somatic dysfunction of thoracic region: Secondary | ICD-10-CM | POA: Diagnosis not present

## 2022-03-07 DIAGNOSIS — M9901 Segmental and somatic dysfunction of cervical region: Secondary | ICD-10-CM | POA: Diagnosis not present

## 2022-03-07 DIAGNOSIS — M9903 Segmental and somatic dysfunction of lumbar region: Secondary | ICD-10-CM | POA: Diagnosis not present

## 2022-03-08 ENCOUNTER — Ambulatory Visit (INDEPENDENT_AMBULATORY_CARE_PROVIDER_SITE_OTHER): Payer: Medicare Other | Admitting: Family Medicine

## 2022-03-08 ENCOUNTER — Ambulatory Visit (INDEPENDENT_AMBULATORY_CARE_PROVIDER_SITE_OTHER): Payer: Medicare Other

## 2022-03-08 ENCOUNTER — Encounter: Payer: Self-pay | Admitting: Family Medicine

## 2022-03-08 VITALS — BP 105/74 | HR 78 | Temp 98.7°F | Ht 66.0 in | Wt 189.0 lb

## 2022-03-08 DIAGNOSIS — S92535A Nondisplaced fracture of distal phalanx of left lesser toe(s), initial encounter for closed fracture: Secondary | ICD-10-CM | POA: Diagnosis not present

## 2022-03-08 DIAGNOSIS — S99922A Unspecified injury of left foot, initial encounter: Secondary | ICD-10-CM

## 2022-03-08 NOTE — Progress Notes (Signed)
?  ? ?Subjective:  ?Patient ID: Jenna Smith, female    DOB: 1951-08-14, 71 y.o.   MRN: [id number] ? ?Patient Care Team: ?Raliegh Ip, DO as PCP - General (Family Medicine) ?Lanelle Bal, DO as Consulting Physician (Internal Medicine)  ? ?Chief Complaint:  Foot Injury ? ? ?HPI: ?Jenna Smith is a 71 y.o. female presenting on 03/08/2022 for Foot Injury ? ? ?Pt here today for evaluation of left foot after hitting on her footboard of her bed 2 days ago. Reports bruising and swelling to toes, no pain. ? ?Foot Injury  ?The incident occurred 2 days ago. The incident occurred at home. The injury mechanism was a direct blow. The pain is present in the left foot and left toes. The patient is experiencing no pain. Pertinent negatives include no inability to bear weight, loss of motion, loss of sensation, muscle weakness, numbness or tingling. She reports no foreign bodies present. She has tried nothing for the symptoms.  ? ?Relevant past medical, surgical, family, and social history reviewed and updated as indicated.  ?Allergies and medications reviewed and updated. Data reviewed: Chart in Epic. ? ? ?Past Medical History:  ?Diagnosis Date  ? Allergy   ? multiple food and environmental allergies  ? Anemia 12/22/2017  ? Arthritis   ? ? ?Past Surgical History:  ?Procedure Laterality Date  ? BREAST SURGERY Bilateral 02/2000  ? Breast reduction  ? COLONOSCOPY WITH PROPOFOL N/A 05/29/2021  ? Surgeon: Earnest Bailey K, DO; Nonbleeding internal hemorrhoids, 10 mm polyp in the cecum.  Pathology revealed tubular adenoma.  Recommended repeat colonoscopy in 3 years.  ? COSMETIC SURGERY  06/2011  ? Face lift  ? ESOPHAGOGASTRODUODENOSCOPY (EGD) WITH PROPOFOL N/A 05/29/2021  ? Surgeon: Lanelle Bal, DO; Gastritis biopsied, normal esophagus and duodenum. Pathology with reactive gastropathy, negative for H. pylori.  ? REDUCTION MAMMAPLASTY    ? TONSILLECTOMY    ? 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  Procedure: RIGHT TOTAL KNEE ARTHROPLASTY;  Surgeon: Ranee Gosselin, MD;  Location: [facility];  Service: Orthopedics;  Laterality: Right;  ? ? ?Social History  ? ?Socioeconomic History  ? Marital status: Married  ?  Spouse name: Brett Canales  ? Number of children: 1  ? Years of education: 84  ? Highest education level: Some college, no degree  ?Occupational History  ? Occupation: retired  ?Tobacco Use  ? Smoking status: Former  ?  Packs/day: 0.30  ?  Years: 15.00  ?  Pack years: 4.50  ?  Types: Cigarettes  ?  Quit date: 10/28/1984  ?  Years since quitting: 37.3  ? Smokeless tobacco: Never  ? Tobacco comments:  ?  smoked "off and on" x 15 yrs  ?Vaping Use  ? Vaping Use: Never used  ?Substance and Sexual Activity  ? Alcohol use: Not Currently  ?  Comment: Holidays - occasional wine  ? Drug use: No  ? Sexual activity: Yes  ?  Birth control/protection: Post-menopausal  ?Other Topics Concern  ? Not on file  ?Social History Narrative  ? Not on file  ? ?Social Determinants of Health  ? ?Financial Resource Strain: Low Risk   ? Difficulty of Paying Living Expenses: Not hard at all  ?Food Insecurity: No Food Insecurity  ? Worried About Programme researcher, broadcasting/film/video in the Last Year: Never true  ? Ran Out of Food in the Last Year: Never true  ?Transportation Needs: No Transportation Needs  ? Lack of Transportation (Medical): No  ?  Lack of Transportation (Non-Medical): No  ?Physical Activity: Sufficiently Active  ? Days of Exercise per Week: 4 days  ? Minutes of Exercise per Session: 60 min  ?Stress: No Stress Concern Present  ? Feeling of Stress : Not at all  ?Social Connections: Moderately Integrated  ? Frequency of Communication with Friends and Family: More than three times a week  ? Frequency of Social Gatherings with Friends and Family: More than three times a week  ? Attends Religious Services: Never  ? Active Member of Clubs or Organizations: Yes  ? Attends Banker Meetings: More than 4 times per year  ? Marital Status: Married   ?Intimate Partner Violence: Not At Risk  ? Fear of Current or Ex-Partner: No  ? Emotionally Abused: No  ? Physically Abused: No  ? Sexually Abused: No  ? ? ?Outpatient Encounter Medications as of 03/08/2022  ?Medication Sig  ? acetaminophen (TYLENOL) 650 MG CR tablet Take 650 mg by mouth every 8 (eight) hours as needed for pain.  ? Biotin w/ Vitamins C & E (HAIR SKIN & NAILS GUMMIES PO) Take 2 each by mouth daily.  ? Calcium Carbonate-Vit D-Min (CALCIUM 1200) 1200-1000 MG-UNIT CHEW Chew 2 tablets by mouth daily.  ? cetirizine (ZYRTEC) 10 MG tablet Take 10 mg by mouth daily as needed for allergies.  ? Cholecalciferol (VITAMIN D3) 250 MCG (10000 UT) capsule Take 10,000 Units by mouth daily.  ? Magnesium Gluconate 550 MG TABS   ? methocarbamol (ROBAXIN) 500 MG tablet Take 1 tablet (500 mg total) by mouth every 8 (eight) hours as needed for muscle spasms.  ? Omega-3 Fatty Acids (FISH OIL) 1000 MG CAPS Take 2,000 mg by mouth in the morning, at noon, and at bedtime. Relief Factor  ? OVER THE COUNTER MEDICATION Take 1 tablet by mouth daily. Zinc 100mg  Vitamin C 1000mg  Vitamin d3 1000 IU daily  ? OVER THE COUNTER MEDICATION Take 1 tablet by mouth daily. Magnesium 375 Potassium 450  ? ?No facility-administered encounter medications on file as of 03/08/2022.  ? ? ?Allergies  ?Allergen Reactions  ? Bactrim [Sulfamethoxazole-Trimethoprim] Shortness Of Breath  ? Bee Venom Anaphylaxis  ? Prednisone   ?  Pt states she thinks this was due to her taking prednisone with her pain medications-has taken prednisone since and didn't have any problems  ? Indomethacin Swelling  ?  Face swells up  ? Avocado Hives  ? Bacitracin   ? Latex Hives  ? Neosporin [Neomycin-Bacitracin Zn-Polymyx] Hives  ? Other   ?  Numerous food allergies: ?Broccoli, buckwheat, cabbage, cantaloupe, cinnamon, corn, polyps, lumen, molds, mushroom, black pepper, green pepper, pineapple, sweet potato, spinach, tomato, whole-wheat, whole egg, [place] meat, cow milk, crab,  lobster, salmon, shellfish mix, coconut, black walnut. ? ?*Patient states she continues to eat essentially all of these items.  ? Penicillins Hives  ?  Has patient had a PCN reaction causing immediate rash, facial/tongue/throat swelling, SOB or lightheadedness with hypotension: no ?Has patient had a PCN reaction causing severe rash involving mucus membranes or skin necrosis: no ?Has patient had a PCN reaction that required hospitalization: no ?Has patient had a PCN reaction occurring within the last 10 years: no ?If all of the above answers are "NO", then may proceed with Cephalosporin use. ?  ? Polymyxin B   ? Pomegranate (Punica Granatum) Hives  ? ? ?Review of Systems  ?Constitutional:  Negative for activity change, appetite change, chills, diaphoresis, fatigue, fever and unexpected weight change.  ?HENT: Negative.    ?  Eyes: Negative.   ?Respiratory:  Negative for cough, chest tightness and shortness of breath.   ?Cardiovascular:  Negative for chest pain, palpitations and leg swelling.  ?Gastrointestinal:  Negative for abdominal pain, blood in stool, constipation, diarrhea, nausea and vomiting.  ?Endocrine: Negative.   ?Genitourinary:  Negative for dysuria, frequency and urgency.  ?Musculoskeletal:  Positive for joint swelling. Negative for arthralgias and myalgias.  ?     Bruising to left toes  ?Skin: Negative.   ?Allergic/Immunologic: Negative.   ?Neurological:  Negative for dizziness, tingling, numbness and headaches.  ?Hematological: Negative.   ?Psychiatric/Behavioral:  Negative for confusion, hallucinations, sleep disturbance and suicidal ideas.   ?All other systems reviewed and are negative. ? ?   ? ?Objective:  ?BP 105/74   Pulse 78   Temp 98.7 ?F (37.1 ?C)   Ht 5\' 6"  (1.676 m)   Wt 189 lb (85.7 kg)   SpO2 92%   BMI 30.51 kg/m?   ? ?Wt Readings from Last 3 Encounters:  ?03/08/22 189 lb (85.7 kg)  ?02/21/22 190 lb 3.2 oz (86.3 kg)  ?10/03/21 195 lb 3.2 oz (88.5 kg)  ? ? ?Physical Exam ?Vitals and  nursing note reviewed.  ?Constitutional:   ?   General: She is not in acute distress. ?   Appearance: Normal appearance. She is obese. She is not ill-appearing, toxic-appearing or diaphoretic.  ?HENT:  ?   Head: N

## 2022-03-15 DIAGNOSIS — S61210A Laceration without foreign body of right index finger without damage to nail, initial encounter: Secondary | ICD-10-CM | POA: Diagnosis not present

## 2022-03-26 DIAGNOSIS — M79672 Pain in left foot: Secondary | ICD-10-CM | POA: Diagnosis not present

## 2022-03-26 DIAGNOSIS — M2012 Hallux valgus (acquired), left foot: Secondary | ICD-10-CM | POA: Diagnosis not present

## 2022-04-08 ENCOUNTER — Ambulatory Visit (INDEPENDENT_AMBULATORY_CARE_PROVIDER_SITE_OTHER): Payer: Medicare Other | Admitting: Family Medicine

## 2022-04-08 ENCOUNTER — Encounter: Payer: Self-pay | Admitting: Family Medicine

## 2022-04-08 VITALS — BP 116/72 | HR 77 | Temp 97.6°F | Ht 66.0 in | Wt 188.6 lb

## 2022-04-08 DIAGNOSIS — H65193 Other acute nonsuppurative otitis media, bilateral: Secondary | ICD-10-CM

## 2022-04-08 DIAGNOSIS — J3489 Other specified disorders of nose and nasal sinuses: Secondary | ICD-10-CM | POA: Diagnosis not present

## 2022-04-08 MED ORDER — MUPIROCIN 2 % EX OINT: TOPICAL_OINTMENT | CUTANEOUS | 0 refills | Status: DC

## 2022-04-08 NOTE — Progress Notes (Signed)
Subjective: CC: Water in ears PCP: Janora Norlander, DO ZWC:HENIDPOEUM A Jenna Smith is a 71 y.o. female presenting to clinic today for:  1.  Water in ears Patient reports that her ears have been feeling swimmy and likely have water in them.  Course since she is coming to the office today she does not have this symptom but it was pretty prevalent last week.  She denies any recent swimming or submerging of head in water.  She takes Zyrtec daily.  She does not report any rhinorrhea but is been having some irritation and drainage from the inner her nose that is similar to when she needed the Bactroban previously.  She has been working a lot outside, spreading mulch.    ROS: Per HPI  Allergies  Allergen Reactions   Bactrim [Sulfamethoxazole-Trimethoprim] Shortness Of Breath   Bee Venom Anaphylaxis   Prednisone     Pt states she thinks this was due to her taking prednisone with her pain medications-has taken prednisone since and didn't have any problems   Indomethacin Swelling    Face swells up   Avocado Hives   Bacitracin    Latex Hives   Neosporin [Neomycin-Bacitracin Zn-Polymyx] Hives   Other     Numerous food allergies: Broccoli, buckwheat, cabbage, cantaloupe, cinnamon, corn, polyps, lumen, molds, mushroom, black pepper, green pepper, pineapple, sweet potato, spinach, tomato, whole-wheat, whole egg, [place] meat, cow milk, crab, lobster, salmon, shellfish mix, coconut, black walnut.  *Patient states she continues to eat essentially all of these items.   Penicillins Hives    Has patient had a PCN reaction causing immediate rash, facial/tongue/throat swelling, SOB or lightheadedness with hypotension: no Has patient had a PCN reaction causing severe rash involving mucus membranes or skin necrosis: no Has patient had a PCN reaction that required hospitalization: no Has patient had a PCN reaction occurring within the last 10 years: no If all of the above answers are "NO", then may  proceed with Cephalosporin use.    Polymyxin B    Pomegranate (Punica Granatum) Hives   Past Medical History:  Diagnosis Date   Allergy    multiple food and environmental allergies   Anemia 12/22/2017   Arthritis     Current Outpatient Medications:    acetaminophen (TYLENOL) 650 MG CR tablet, Take 650 mg by mouth every 8 (eight) hours as needed for pain., Disp: , Rfl:    Biotin w/ Vitamins C & E (HAIR SKIN & NAILS GUMMIES PO), Take 2 each by mouth daily., Disp: , Rfl:    Calcium Carbonate-Vit D-Min (CALCIUM 1200) 1200-1000 MG-UNIT CHEW, Chew 2 tablets by mouth daily., Disp: , Rfl:    cetirizine (ZYRTEC) 10 MG tablet, Take 10 mg by mouth daily as needed for allergies., Disp: , Rfl:    Cholecalciferol (VITAMIN D3) 250 MCG (10000 UT) capsule, Take 10,000 Units by mouth daily., Disp: , Rfl:    Magnesium Gluconate 550 MG TABS, , Disp: , Rfl:    methocarbamol (ROBAXIN) 500 MG tablet, Take 1 tablet (500 mg total) by mouth every 8 (eight) hours as needed for muscle spasms., Disp: 60 tablet, Rfl: 0   Omega-3 Fatty Acids (FISH OIL) 1000 MG CAPS, Take 2,000 mg by mouth in the morning, at noon, and at bedtime. Relief Factor, Disp: , Rfl:    OVER THE COUNTER MEDICATION, Take 1 tablet by mouth daily. Zinc '100mg'$  Vitamin C '1000mg'$  Vitamin d3 1000 IU daily, Disp: , Rfl:    OVER THE COUNTER MEDICATION, Take 1  tablet by mouth daily. Magnesium 375 Potassium 450, Disp: , Rfl:    TART CHERRY PO, Take by mouth., Disp: , Rfl:  Social History   Socioeconomic History   Marital status: Married    Spouse name: Richardson Landry   Number of children: 1   Years of education: 13   Highest education level: Some college, no degree  Occupational History   Occupation: retired  Tobacco Use   Smoking status: Former    Packs/day: 0.30    Years: 15.00    Total pack years: 4.50    Types: Cigarettes    Quit date: 10/28/1984    Years since quitting: 37.4   Smokeless tobacco: Never   Tobacco comments:    smoked "off and on" x 15  yrs  Vaping Use   Vaping Use: Never used  Substance and Sexual Activity   Alcohol use: Not Currently    Comment: Holidays - occasional wine   Drug use: No   Sexual activity: Yes    Birth control/protection: Post-menopausal  Other Topics Concern   Not on file  Social History Narrative   Not on file   Social Determinants of Health   Financial Resource Strain: Low Risk  (07/31/2021)   Overall Financial Resource Strain (CARDIA)    Difficulty of Paying Living Expenses: Not hard at all  Food Insecurity: No Food Insecurity (07/31/2021)   Hunger Vital Sign    Worried About Running Out of Food in the Last Year: Never true    Ran Out of Food in the Last Year: Never true  Transportation Needs: No Transportation Needs (07/31/2021)   PRAPARE - Hydrologist (Medical): No    Lack of Transportation (Non-Medical): No  Physical Activity: Sufficiently Active (07/31/2021)   Exercise Vital Sign    Days of Exercise per Week: 4 days    Minutes of Exercise per Session: 60 min  Stress: No Stress Concern Present (07/31/2021)   [place]    Feeling of Stress : Not at all  Social Connections: Moderately Integrated (07/31/2021)   Social Connection and Isolation Panel [NHANES]    Frequency of Communication with Friends and Family: More than three times a week    Frequency of Social Gatherings with Friends and Family: More than three times a week    Attends Religious Services: Never    Marine scientist or Organizations: Yes    Attends Music therapist: More than 4 times per year    Marital Status: Married  Human resources officer Violence: Not At Risk (07/31/2021)   Humiliation, Afraid, Rape, and Kick questionnaire    Fear of Current or Ex-Partner: No    Emotionally Abused: No    Physically Abused: No    Sexually Abused: No   Family History  Problem Relation Age of Onset   Aneurysm Mother         passed away at age 69   Cancer Father        passed away after Prostate cancer metastesize   Cancer Sister        she passed away 70 due to Breast Cancer   Stroke Sister    Aneurysm Sister    Colon cancer Neg Hx    Breast cancer Neg Hx     Objective: Office vital signs reviewed. BP 116/72   Pulse 77   Temp 97.6 F (36.4 C)   Ht '5\' 6"'$  (1.676 m)   Wt  188 lb 9.6 oz (85.5 kg)   SpO2 95%   BMI 30.44 kg/m   Physical Examination:  General: Awake, alert, well nourished, No acute distress HEENT: Normal    Neck: No masses palpated. No lymphadenopathy    Ears: Tympanic membranes intact, dulled light reflex, no erythema, no bulging.  Left TM with clear effusion appreciated    Nose: clear nasal discharge    Throat: moist mucus membranes  Cardio: regular rate and rhythm  Pulm: normal WOB on room air  Assessment/ Plan: 71 y.o. female   Acute effusion of both middle ears  Nasal vestibulitis - Plan: mupirocin ointment (BACTROBAN) 2 %  Okay to use Claritin in the morning and Zyrtec at night to clear up the effusion which is likely allergic in nature of the middle ears.  There is no evidence of secondary bacterial infection.  No bulging, erythema or perforation on exam  For her nasal symptoms, Bactroban nasal ointment prescribed.  Follow-up as needed  No orders of the defined types were placed in this encounter.  No orders of the defined types were placed in this encounter.    Janora Norlander, DO [place] [phone]

## 2022-04-12 ENCOUNTER — Encounter: Payer: Self-pay | Admitting: Family Medicine

## 2022-04-12 ENCOUNTER — Ambulatory Visit (INDEPENDENT_AMBULATORY_CARE_PROVIDER_SITE_OTHER): Payer: Medicare Other | Admitting: Family Medicine

## 2022-04-12 VITALS — BP 113/78 | HR 79 | Temp 98.7°F | Ht 66.0 in | Wt 191.0 lb

## 2022-04-12 DIAGNOSIS — L03115 Cellulitis of right lower limb: Secondary | ICD-10-CM | POA: Diagnosis not present

## 2022-04-12 DIAGNOSIS — T63441A Toxic effect of venom of bees, accidental (unintentional), initial encounter: Secondary | ICD-10-CM | POA: Diagnosis not present

## 2022-04-12 MED ORDER — EPINEPHRINE 0.3 MG/0.3ML IJ SOAJ: 0.3000 mg | INTRAMUSCULAR | 3 refills | Status: DC | PRN

## 2022-04-12 MED ORDER — METHYLPREDNISOLONE ACETATE 80 MG/ML IJ SUSP
60.0000 mg | Freq: Once | INTRAMUSCULAR | Status: AC
  Administered 2022-04-12: 60 mg via INTRAMUSCULAR

## 2022-04-12 MED ORDER — DOXYCYCLINE HYCLATE 100 MG PO TABS
100.0000 mg | ORAL_TABLET | Freq: Two times a day (BID) | ORAL | 0 refills | Status: AC
Start: 2022-04-12 — End: 2022-04-19

## 2022-04-12 NOTE — Progress Notes (Signed)
Subjective:  Patient ID: Jenna Smith, female    DOB: 05/31/1951, 71 y.o.   MRN: [id number]  Patient Care Team: Janora Norlander, DO as PCP - General (Family Medicine) Eloise Harman, DO as Consulting Physician (Internal Medicine)   Chief Complaint:  Insect Bite (Bee sting/)   HPI: Jenna Smith is a 71 y.o. female presenting on 04/12/2022 for Insect Bite (Bee sting/)   Pt presents today with complaints of a bee sting to her right hip. States this happened 2 days ago and continues to swell and itch. She has been taking Zyrtec without relief of symptoms. 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 States the are is red, hot, and swollen.     Relevant past medical, surgical, family, and social history reviewed and updated as indicated.  Allergies and medications reviewed and updated. Data reviewed: Chart in Epic.   Past Medical History:  Diagnosis Date   Allergy    multiple food and environmental allergies   Anemia 12/22/2017   Arthritis     Past Surgical History:  Procedure Laterality Date   BREAST SURGERY Bilateral 02/2000   Breast reduction   COLONOSCOPY WITH PROPOFOL N/A 05/29/2021   Surgeon: Hurshel Keys K, DO; Nonbleeding internal hemorrhoids, 10 mm polyp in the cecum.  Pathology revealed tubular adenoma.  Recommended repeat colonoscopy in 3 years.   COSMETIC SURGERY  06/2011   Face lift   ESOPHAGOGASTRODUODENOSCOPY (EGD) WITH PROPOFOL N/A 05/29/2021   Surgeon: Eloise Harman, DO; Gastritis biopsied, normal esophagus and duodenum. Pathology with reactive gastropathy, negative for H. pylori.   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   Procedure: RIGHT TOTAL KNEE ARTHROPLASTY;  Surgeon: [name], MD;  Location: [facility];  Service: Orthopedics;  Laterality: Right;    Social History   Socioeconomic History   Marital status: Married    Spouse name:  Richardson Landry   Number of children: 1   Years of education: 13   Highest education level: Some college, no degree  Occupational History   Occupation: retired  Tobacco Use   Smoking status: Former    Packs/day: 0.30    Years: 15.00    Total pack years: 4.50    Types: Cigarettes    Quit date: 10/28/1984    Years since quitting: 37.4   Smokeless tobacco: Never   Tobacco comments:    smoked "off and on" x 15 yrs  Vaping Use   Vaping Use: Never used  Substance and Sexual Activity   Alcohol use: Not Currently    Comment: Holidays - occasional wine   Drug use: No   Sexual activity: Yes    Birth control/protection: Post-menopausal  Other Topics Concern   Not on file  Social History Narrative   Not on file   Social Determinants of Health   Financial Resource Strain: Low Risk  (07/31/2021)   Overall Financial Resource Strain (CARDIA)    Difficulty of Paying Living Expenses: Not hard at all  Food Insecurity: No Food Insecurity (07/31/2021)   Hunger Vital Sign    Worried About Running Out of Food in the Last Year: Never true    Ran Out of Food in the Last Year: Never true  Transportation Needs: No Transportation Needs (07/31/2021)   PRAPARE - Hydrologist (Medical): No    Lack of Transportation (Non-Medical): No  Physical  Subjective:  Patient ID: Jenna Smith, female    DOB: 1951-05-24, 71 y.o.   MRN: [id number]  Patient Care Team: Janora Norlander, DO as PCP - General (Family Medicine) Eloise Harman, DO as Consulting Physician (Internal Medicine)   Chief Complaint:  Insect Bite (Bee sting/)   HPI: Jenna Smith is a 71 y.o. female presenting on 04/12/2022 for Insect Bite (Bee sting/)   Pt presents today with complaints of a bee sting to her right hip. States this happened 2 days ago and continues to swell and itch. She has been taking Zyrtec without relief of symptoms. 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 States the are is red, hot, and swollen.     Relevant past medical, surgical, family, and social history reviewed and updated as indicated.  Allergies and medications reviewed and updated. Data reviewed: Chart in Epic.   Past Medical History:  Diagnosis Date   Allergy    multiple food and environmental allergies   Anemia 12/22/2017   Arthritis     Past Surgical History:  Procedure Laterality Date   BREAST SURGERY Bilateral 02/2000   Breast reduction   COLONOSCOPY WITH PROPOFOL N/A 05/29/2021   Surgeon: Hurshel Keys K, DO; Nonbleeding internal hemorrhoids, 10 mm polyp in the cecum.  Pathology revealed tubular adenoma.  Recommended repeat colonoscopy in 3 years.   COSMETIC SURGERY  06/2011   Face lift   ESOPHAGOGASTRODUODENOSCOPY (EGD) WITH PROPOFOL N/A 05/29/2021   Surgeon: Eloise Harman, DO; Gastritis biopsied, normal esophagus and duodenum. Pathology with reactive gastropathy, negative for H. pylori.   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   Procedure: RIGHT TOTAL KNEE ARTHROPLASTY;  Surgeon: [name], MD;  Location: [facility];  Service: Orthopedics;  Laterality: Right;    Social History   Socioeconomic History   Marital status: Married    Spouse name:  Richardson Landry   Number of children: 1   Years of education: 13   Highest education level: Some college, no degree  Occupational History   Occupation: retired  Tobacco Use   Smoking status: Former    Packs/day: 0.30    Years: 15.00    Total pack years: 4.50    Types: Cigarettes    Quit date: 10/28/1984    Years since quitting: 37.4   Smokeless tobacco: Never   Tobacco comments:    smoked "off and on" x 15 yrs  Vaping Use   Vaping Use: Never used  Substance and Sexual Activity   Alcohol use: Not Currently    Comment: Holidays - occasional wine   Drug use: No   Sexual activity: Yes    Birth control/protection: Post-menopausal  Other Topics Concern   Not on file  Social History Narrative   Not on file   Social Determinants of Health   Financial Resource Strain: Low Risk  (07/31/2021)   Overall Financial Resource Strain (CARDIA)    Difficulty of Paying Living Expenses: Not hard at all  Food Insecurity: No Food Insecurity (07/31/2021)   Hunger Vital Sign    Worried About Running Out of Food in the Last Year: Never true    Ran Out of Food in the Last Year: Never true  Transportation Needs: No Transportation Needs (07/31/2021)   PRAPARE - Hydrologist (Medical): No    Lack of Transportation (Non-Medical): No  Physical  Subjective:  Patient ID: Jenna Smith, female    DOB: 1951-05-24, 71 y.o.   MRN: [id number]  Patient Care Team: Janora Norlander, DO as PCP - General (Family Medicine) Eloise Harman, DO as Consulting Physician (Internal Medicine)   Chief Complaint:  Insect Bite (Bee sting/)   HPI: Jenna Smith is a 71 y.o. female presenting on 04/12/2022 for Insect Bite (Bee sting/)   Pt presents today with complaints of a bee sting to her right hip. States this happened 2 days ago and continues to swell and itch. She has been taking Zyrtec without relief of symptoms. 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 States the are is red, hot, and swollen.     Relevant past medical, surgical, family, and social history reviewed and updated as indicated.  Allergies and medications reviewed and updated. Data reviewed: Chart in Epic.   Past Medical History:  Diagnosis Date   Allergy    multiple food and environmental allergies   Anemia 12/22/2017   Arthritis     Past Surgical History:  Procedure Laterality Date   BREAST SURGERY Bilateral 02/2000   Breast reduction   COLONOSCOPY WITH PROPOFOL N/A 05/29/2021   Surgeon: Hurshel Keys K, DO; Nonbleeding internal hemorrhoids, 10 mm polyp in the cecum.  Pathology revealed tubular adenoma.  Recommended repeat colonoscopy in 3 years.   COSMETIC SURGERY  06/2011   Face lift   ESOPHAGOGASTRODUODENOSCOPY (EGD) WITH PROPOFOL N/A 05/29/2021   Surgeon: Eloise Harman, DO; Gastritis biopsied, normal esophagus and duodenum. Pathology with reactive gastropathy, negative for H. pylori.   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   Procedure: RIGHT TOTAL KNEE ARTHROPLASTY;  Surgeon: [name], MD;  Location: [facility];  Service: Orthopedics;  Laterality: Right;    Social History   Socioeconomic History   Marital status: Married    Spouse name:  Richardson Landry   Number of children: 1   Years of education: 13   Highest education level: Some college, no degree  Occupational History   Occupation: retired  Tobacco Use   Smoking status: Former    Packs/day: 0.30    Years: 15.00    Total pack years: 4.50    Types: Cigarettes    Quit date: 10/28/1984    Years since quitting: 37.4   Smokeless tobacco: Never   Tobacco comments:    smoked "off and on" x 15 yrs  Vaping Use   Vaping Use: Never used  Substance and Sexual Activity   Alcohol use: Not Currently    Comment: Holidays - occasional wine   Drug use: No   Sexual activity: Yes    Birth control/protection: Post-menopausal  Other Topics Concern   Not on file  Social History Narrative   Not on file   Social Determinants of Health   Financial Resource Strain: Low Risk  (07/31/2021)   Overall Financial Resource Strain (CARDIA)    Difficulty of Paying Living Expenses: Not hard at all  Food Insecurity: No Food Insecurity (07/31/2021)   Hunger Vital Sign    Worried About Running Out of Food in the Last Year: Never true    Ran Out of Food in the Last Year: Never true  Transportation Needs: No Transportation Needs (07/31/2021)   PRAPARE - Hydrologist (Medical): No    Lack of Transportation (Non-Medical): No  Physical  Subjective:  Patient ID: Jenna Smith, female    DOB: 1951-05-24, 71 y.o.   MRN: [id number]  Patient Care Team: Janora Norlander, DO as PCP - General (Family Medicine) Eloise Harman, DO as Consulting Physician (Internal Medicine)   Chief Complaint:  Insect Bite (Bee sting/)   HPI: Jenna Smith is a 71 y.o. female presenting on 04/12/2022 for Insect Bite (Bee sting/)   Pt presents today with complaints of a bee sting to her right hip. States this happened 2 days ago and continues to swell and itch. She has been taking Zyrtec without relief of symptoms. 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 States the are is red, hot, and swollen.     Relevant past medical, surgical, family, and social history reviewed and updated as indicated.  Allergies and medications reviewed and updated. Data reviewed: Chart in Epic.   Past Medical History:  Diagnosis Date   Allergy    multiple food and environmental allergies   Anemia 12/22/2017   Arthritis     Past Surgical History:  Procedure Laterality Date   BREAST SURGERY Bilateral 02/2000   Breast reduction   COLONOSCOPY WITH PROPOFOL N/A 05/29/2021   Surgeon: Hurshel Keys K, DO; Nonbleeding internal hemorrhoids, 10 mm polyp in the cecum.  Pathology revealed tubular adenoma.  Recommended repeat colonoscopy in 3 years.   COSMETIC SURGERY  06/2011   Face lift   ESOPHAGOGASTRODUODENOSCOPY (EGD) WITH PROPOFOL N/A 05/29/2021   Surgeon: Eloise Harman, DO; Gastritis biopsied, normal esophagus and duodenum. Pathology with reactive gastropathy, negative for H. pylori.   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   Procedure: RIGHT TOTAL KNEE ARTHROPLASTY;  Surgeon: [name], MD;  Location: [facility];  Service: Orthopedics;  Laterality: Right;    Social History   Socioeconomic History   Marital status: Married    Spouse name:  Richardson Landry   Number of children: 1   Years of education: 13   Highest education level: Some college, no degree  Occupational History   Occupation: retired  Tobacco Use   Smoking status: Former    Packs/day: 0.30    Years: 15.00    Total pack years: 4.50    Types: Cigarettes    Quit date: 10/28/1984    Years since quitting: 37.4   Smokeless tobacco: Never   Tobacco comments:    smoked "off and on" x 15 yrs  Vaping Use   Vaping Use: Never used  Substance and Sexual Activity   Alcohol use: Not Currently    Comment: Holidays - occasional wine   Drug use: No   Sexual activity: Yes    Birth control/protection: Post-menopausal  Other Topics Concern   Not on file  Social History Narrative   Not on file   Social Determinants of Health   Financial Resource Strain: Low Risk  (07/31/2021)   Overall Financial Resource Strain (CARDIA)    Difficulty of Paying Living Expenses: Not hard at all  Food Insecurity: No Food Insecurity (07/31/2021)   Hunger Vital Sign    Worried About Running Out of Food in the Last Year: Never true    Ran Out of Food in the Last Year: Never true  Transportation Needs: No Transportation Needs (07/31/2021)   PRAPARE - Hydrologist (Medical): No    Lack of Transportation (Non-Medical): No  Physical  Subjective:  Patient ID: Jenna Smith, female    DOB: 05/31/1951, 71 y.o.   MRN: [id number]  Patient Care Team: Janora Norlander, DO as PCP - General (Family Medicine) Eloise Harman, DO as Consulting Physician (Internal Medicine)   Chief Complaint:  Insect Bite (Bee sting/)   HPI: Jenna Smith is a 71 y.o. female presenting on 04/12/2022 for Insect Bite (Bee sting/)   Pt presents today with complaints of a bee sting to her right hip. States this happened 2 days ago and continues to swell and itch. She has been taking Zyrtec without relief of symptoms. 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 States the are is red, hot, and swollen.     Relevant past medical, surgical, family, and social history reviewed and updated as indicated.  Allergies and medications reviewed and updated. Data reviewed: Chart in Epic.   Past Medical History:  Diagnosis Date   Allergy    multiple food and environmental allergies   Anemia 12/22/2017   Arthritis     Past Surgical History:  Procedure Laterality Date   BREAST SURGERY Bilateral 02/2000   Breast reduction   COLONOSCOPY WITH PROPOFOL N/A 05/29/2021   Surgeon: Hurshel Keys K, DO; Nonbleeding internal hemorrhoids, 10 mm polyp in the cecum.  Pathology revealed tubular adenoma.  Recommended repeat colonoscopy in 3 years.   COSMETIC SURGERY  06/2011   Face lift   ESOPHAGOGASTRODUODENOSCOPY (EGD) WITH PROPOFOL N/A 05/29/2021   Surgeon: Eloise Harman, DO; Gastritis biopsied, normal esophagus and duodenum. Pathology with reactive gastropathy, negative for H. pylori.   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   Procedure: RIGHT TOTAL KNEE ARTHROPLASTY;  Surgeon: [name], MD;  Location: [facility];  Service: Orthopedics;  Laterality: Right;    Social History   Socioeconomic History   Marital status: Married    Spouse name:  Richardson Landry   Number of children: 1   Years of education: 13   Highest education level: Some college, no degree  Occupational History   Occupation: retired  Tobacco Use   Smoking status: Former    Packs/day: 0.30    Years: 15.00    Total pack years: 4.50    Types: Cigarettes    Quit date: 10/28/1984    Years since quitting: 37.4   Smokeless tobacco: Never   Tobacco comments:    smoked "off and on" x 15 yrs  Vaping Use   Vaping Use: Never used  Substance and Sexual Activity   Alcohol use: Not Currently    Comment: Holidays - occasional wine   Drug use: No   Sexual activity: Yes    Birth control/protection: Post-menopausal  Other Topics Concern   Not on file  Social History Narrative   Not on file   Social Determinants of Health   Financial Resource Strain: Low Risk  (07/31/2021)   Overall Financial Resource Strain (CARDIA)    Difficulty of Paying Living Expenses: Not hard at all  Food Insecurity: No Food Insecurity (07/31/2021)   Hunger Vital Sign    Worried About Running Out of Food in the Last Year: Never true    Ran Out of Food in the Last Year: Never true  Transportation Needs: No Transportation Needs (07/31/2021)   PRAPARE - Hydrologist (Medical): No    Lack of Transportation (Non-Medical): No  Physical

## 2022-04-12 NOTE — Addendum Note (Signed)
Addended by: Geryl Rankins D on: 04/12/2022 08:16 AM   Modules accepted: Orders

## 2022-04-18 ENCOUNTER — Encounter: Payer: Self-pay | Admitting: Physician Assistant

## 2022-04-18 ENCOUNTER — Ambulatory Visit (INDEPENDENT_AMBULATORY_CARE_PROVIDER_SITE_OTHER): Payer: Medicare Other | Admitting: Physician Assistant

## 2022-04-18 VITALS — BP 95/65 | HR 81 | Temp 96.7°F | Ht 66.0 in | Wt 189.0 lb

## 2022-04-18 DIAGNOSIS — T63441A Toxic effect of venom of bees, accidental (unintentional), initial encounter: Secondary | ICD-10-CM

## 2022-04-18 NOTE — Patient Instructions (Signed)
Bee, Wasp, or [employer], Adult Bees, wasps, and hornets are part of a family of insects that can sting people. These stings can cause pain and inflammation, but they are usually not serious. However, some people may have an allergic reaction to a sting. This can cause the symptoms to be more severe. What increases the risk? You may be at a greater risk of getting stung if you: Provoke a stinging insect by swatting or disturbing it. Wear strong-smelling soaps, deodorants, or body sprays. Spend time outdoors near gardens with flowers or fruit trees or in clothes that expose skin. Eat or drink outside. What are the signs or symptoms? Common symptoms of this condition include: A red lump in the skin that sometimes has a tiny hole in the center. In some cases, a stinger may be in the center of the wound. Pain and itching at the sting site. Redness and swelling around the sting site. If you have an allergic reaction (localized allergic reaction), the swelling and redness may spread out from the sting site. In some cases, this reaction can continue to develop over the next 24-48 hours. In rare cases, a person may have a severe allergic reaction (anaphylactic reaction) to a sting. Symptoms of an anaphylactic reaction may include: Wheezing or difficulty breathing. Raised, itchy, red patches on the skin (hives). Nausea or vomiting. Abdominal cramping. Diarrhea. Tightness in the chest or chest pain. Dizziness or fainting. Redness of the face (flushing). Hoarse voice. Swollen tongue, lips, or face. How is this diagnosed? This condition is usually diagnosed based on your symptoms and medical history as well as a physical exam. You may have an allergy test to determine if you are allergic to the substance that the insect injected during the sting (venom). How is this treated? If you were stung by a bee, the stinger and a small sac of venom may be in the wound. It is important to remove the stinger as  soon as possible. You can do this by brushing across the wound with gauze, a fingernail, or a flat card such as a credit card. Removing the stinger can help reduce the severity of your body's reaction to the sting. Most stings can be treated with: Icing to reduce swelling in the area. Medicines (antihistamines) to treat itching or an allergic reaction. Medicines to help reduce pain. These may be medicines that you take by mouth, or medicated creams or lotions that you apply to your skin. Pay close attention to your symptoms after you have been stung. If possible, have someone stay with you to make sure you do not have an allergic reaction. If you have any signs of an allergic reaction, call your health care provider. If you have ever had a severe allergic reaction, your health care provider may give you an inhaler or injectable medicine (epinephrine auto-injector) to use if necessary. Follow these instructions at home:  Wash the sting site 2-3 times each day with soap and water as told by your health care provider. Apply or take over-the-counter and prescription medicines only as told by your health care provider. If directed, apply ice to the sting area. Put ice in a plastic bag. Place a towel between your skin and the bag. Leave the ice on for 20 minutes, 2-3 times a day. Do not scratch the sting area. If you had a severe allergic reaction to a sting, you may need: To wear a medical bracelet or necklace that lists the allergy. To learn when and how  to use an anaphylaxis kit or epinephrine injection. Your family members and coworkers may also need to learn this. To carry an anaphylaxis kit or epinephrine injection with you at all times. How is this prevented? Avoid swatting at stinging insects and disturbing insect nests. Do not use fragrant soaps or lotions. Wear shoes, pants, and long sleeves when spending time outdoors, especially in grassy areas where stinging insects are common. Keep  outdoor areas free from nests or hives. Keep food and drink containers covered when eating outdoors. Avoid working or sitting near flowering plants, if possible. Wear gloves if you are gardening or working outdoors. If an attack by a stinging insect or a swarm seems likely in the moment, move away from the area or find a barrier between you and the insect(s), such as a door. Contact a health care provider if: Your symptoms do not get better in 2-3 days. You have redness, swelling, or pain that spreads beyond the area of the sting. You have a fever. Get help right away if: You have symptoms of a severe allergic reaction. These include: Wheezing or difficulty breathing. Tightness in the chest or chest pain. Light-headedness or fainting. Itchy, raised, red patches on the skin. Nausea or vomiting. Abdominal cramping. Diarrhea. A swollen tongue or lips, or trouble swallowing. Dizziness or fainting. Summary Stings from bees, wasps, and hornets can cause pain and inflammation, but they are usually not serious. However, some people may have an allergic reaction to a sting. This can cause the symptoms to be more severe. Pay close attention to your symptoms after you have been stung. If possible, have someone stay with you to make sure you do not have an allergic reaction. Call your health care provider if you have any signs of an allergic reaction. This information is not intended to replace advice given to you by your health care provider. Make sure you discuss any questions you have with your health care provider. Document Revised: 08/06/2020 Document Reviewed: 08/08/2020 [employer] Patient Education  2023 [employer]  

## 2022-04-23 ENCOUNTER — Telehealth (INDEPENDENT_AMBULATORY_CARE_PROVIDER_SITE_OTHER): Payer: Medicare Other | Admitting: Family Medicine

## 2022-04-23 ENCOUNTER — Encounter: Payer: Self-pay | Admitting: Family Medicine

## 2022-04-23 DIAGNOSIS — G8929 Other chronic pain: Secondary | ICD-10-CM

## 2022-04-23 DIAGNOSIS — M545 Low back pain, unspecified: Secondary | ICD-10-CM | POA: Diagnosis not present

## 2022-04-23 DIAGNOSIS — R300 Dysuria: Secondary | ICD-10-CM | POA: Diagnosis not present

## 2022-04-23 LAB — MICROSCOPIC EXAMINATION
RBC, Urine: NONE SEEN /hpf (ref 0–2)
Renal Epithel, UA: NONE SEEN /hpf

## 2022-04-23 LAB — URINALYSIS, COMPLETE
Bilirubin, UA: NEGATIVE
Glucose, UA: NEGATIVE
Leukocytes,UA: NEGATIVE
Nitrite, UA: NEGATIVE
Specific Gravity, UA: 1.015 (ref 1.005–1.030)
Urobilinogen, Ur: 0.2 mg/dL (ref 0.2–1.0)
pH, UA: 5.5 (ref 5.0–7.5)

## 2022-04-23 NOTE — Progress Notes (Signed)
Subjective:    Patient ID: Jenna Smith, female    DOB: 1951/10/27, 71 y.o.   MRN: [id number]   HPI: Jenna Smith Mise is a 71 y.o. female presenting for several months of right flank to lower back pain. Worried it might be her kidney.  She describes the pain as sharp.  It started a few months ago.  Its been stable.  It is intermittent.  It seems to worsen when she does a lot of landscaping in her yard.  She denies any frequency urgency or dysuria.      04/18/2022    8:26 AM 04/12/2022    7:54 AM 04/08/2022   12:55 PM 03/08/2022    9:42 AM 02/21/2022    8:55 AM  Depression screen PHQ 2/9  Decreased Interest 0 0 0 0 0  Down, Depressed, Hopeless 0 0 0 0 0  PHQ - 2 Score 0 0 0 0 0  Altered sleeping  0 0 0   Tired, decreased energy  0 0 0   Change in appetite  0 0 0   Feeling bad or failure about yourself   0 0 0   Trouble concentrating  0 0 0   Moving slowly or fidgety/restless  0 0 0   Suicidal thoughts  0 0 0   PHQ-9 Score  0 0 0   Difficult doing work/chores  Not difficult at all Not difficult at all Not difficult at all      Relevant past medical, surgical, family and social history reviewed and updated as indicated.  Interim medical history since our last visit reviewed. Allergies and medications reviewed and updated.  ROS:  Review of Systems  Constitutional: Negative.  Negative for activity change.  HENT: Negative.    Respiratory:  Negative for shortness of breath.   Cardiovascular:  Negative for chest pain.  Gastrointestinal:  Negative for abdominal pain.  Musculoskeletal:  Negative for arthralgias (has a lot of arthritis).     Social History   Tobacco Use  Smoking Status Former   Packs/day: 0.30   Years: 15.00   Total pack years: 4.50   Types: Cigarettes   Quit date: 10/28/1984   Years since quitting: 37.5  Smokeless Tobacco Never  Tobacco Comments   smoked "off and on" x 15 yrs       Objective:     Wt Readings from Last 3 Encounters:   04/18/22 189 lb (85.7 kg)  04/12/22 191 lb (86.6 kg)  04/08/22 188 lb 9.6 oz (85.5 kg)     Exam: Video visit performed.  No acute distress. Alert, oriented X 3. Speech clear. Affect normal. No rash. FROM all EXT Assessment & Plan:   1. Chronic right-sided low back pain without sciatica     No orders of the defined types were placed in this encounter.   Orders Placed This Encounter  Procedures   Microscopic Examination   Urinalysis, Complete      Diagnoses and all orders for this visit:  Chronic right-sided low back pain without sciatica -     Urinalysis, Complete  Other orders -     Microscopic Examination    Virtual Visit I discussed the limitations, risks, security and privacy concerns of performing an evaluation and management service by Video and the availability of in person appointments. The patient was identified with two identifiers. Pt.expressed understanding and agreed to proceed. Pt. Is at home. Dr. Darlyn Read is in his office.  Follow Up Instructions:  I discussed the assessment and treatment plan with the patient. The patient was provided an opportunity to ask questions and all were answered. The patient agreed with the plan and demonstrated an understanding of the instructions.   The patient was advised to call back or seek an in-person evaluation if the symptoms worsen or if the condition fails to improve as anticipated.   Total minutes contact time: 12   Follow up plan: Return in about 6 months (around 10/23/2022), or if symptoms worsen or fail to improve.  Jenna Claude, MD [place]

## 2022-05-22 DIAGNOSIS — M9901 Segmental and somatic dysfunction of cervical region: Secondary | ICD-10-CM | POA: Diagnosis not present

## 2022-05-22 DIAGNOSIS — M62830 Muscle spasm of back: Secondary | ICD-10-CM | POA: Diagnosis not present

## 2022-05-22 DIAGNOSIS — M9902 Segmental and somatic dysfunction of thoracic region: Secondary | ICD-10-CM | POA: Diagnosis not present

## 2022-05-22 DIAGNOSIS — M9903 Segmental and somatic dysfunction of lumbar region: Secondary | ICD-10-CM | POA: Diagnosis not present

## 2022-05-23 DIAGNOSIS — M9901 Segmental and somatic dysfunction of cervical region: Secondary | ICD-10-CM | POA: Diagnosis not present

## 2022-05-23 DIAGNOSIS — M9903 Segmental and somatic dysfunction of lumbar region: Secondary | ICD-10-CM | POA: Diagnosis not present

## 2022-05-23 DIAGNOSIS — M62830 Muscle spasm of back: Secondary | ICD-10-CM | POA: Diagnosis not present

## 2022-05-23 DIAGNOSIS — M9902 Segmental and somatic dysfunction of thoracic region: Secondary | ICD-10-CM | POA: Diagnosis not present

## 2022-05-27 DIAGNOSIS — M9903 Segmental and somatic dysfunction of lumbar region: Secondary | ICD-10-CM | POA: Diagnosis not present

## 2022-05-27 DIAGNOSIS — M62830 Muscle spasm of back: Secondary | ICD-10-CM | POA: Diagnosis not present

## 2022-05-27 DIAGNOSIS — M9902 Segmental and somatic dysfunction of thoracic region: Secondary | ICD-10-CM | POA: Diagnosis not present

## 2022-05-27 DIAGNOSIS — M9901 Segmental and somatic dysfunction of cervical region: Secondary | ICD-10-CM | POA: Diagnosis not present

## 2022-06-03 DIAGNOSIS — M9901 Segmental and somatic dysfunction of cervical region: Secondary | ICD-10-CM | POA: Diagnosis not present

## 2022-06-03 DIAGNOSIS — M9903 Segmental and somatic dysfunction of lumbar region: Secondary | ICD-10-CM | POA: Diagnosis not present

## 2022-06-03 DIAGNOSIS — M62830 Muscle spasm of back: Secondary | ICD-10-CM | POA: Diagnosis not present

## 2022-06-03 DIAGNOSIS — M9902 Segmental and somatic dysfunction of thoracic region: Secondary | ICD-10-CM | POA: Diagnosis not present

## 2022-06-12 DIAGNOSIS — M62830 Muscle spasm of back: Secondary | ICD-10-CM | POA: Diagnosis not present

## 2022-06-12 DIAGNOSIS — M9903 Segmental and somatic dysfunction of lumbar region: Secondary | ICD-10-CM | POA: Diagnosis not present

## 2022-06-12 DIAGNOSIS — M9901 Segmental and somatic dysfunction of cervical region: Secondary | ICD-10-CM | POA: Diagnosis not present

## 2022-06-12 DIAGNOSIS — M9902 Segmental and somatic dysfunction of thoracic region: Secondary | ICD-10-CM | POA: Diagnosis not present

## 2022-06-19 DIAGNOSIS — M62830 Muscle spasm of back: Secondary | ICD-10-CM | POA: Diagnosis not present

## 2022-06-19 DIAGNOSIS — M9901 Segmental and somatic dysfunction of cervical region: Secondary | ICD-10-CM | POA: Diagnosis not present

## 2022-06-19 DIAGNOSIS — M9902 Segmental and somatic dysfunction of thoracic region: Secondary | ICD-10-CM | POA: Diagnosis not present

## 2022-06-19 DIAGNOSIS — M9903 Segmental and somatic dysfunction of lumbar region: Secondary | ICD-10-CM | POA: Diagnosis not present

## 2022-06-26 DIAGNOSIS — M62830 Muscle spasm of back: Secondary | ICD-10-CM | POA: Diagnosis not present

## 2022-06-26 DIAGNOSIS — M9902 Segmental and somatic dysfunction of thoracic region: Secondary | ICD-10-CM | POA: Diagnosis not present

## 2022-06-26 DIAGNOSIS — M9901 Segmental and somatic dysfunction of cervical region: Secondary | ICD-10-CM | POA: Diagnosis not present

## 2022-06-26 DIAGNOSIS — M9903 Segmental and somatic dysfunction of lumbar region: Secondary | ICD-10-CM | POA: Diagnosis not present

## 2022-07-03 DIAGNOSIS — M9902 Segmental and somatic dysfunction of thoracic region: Secondary | ICD-10-CM | POA: Diagnosis not present

## 2022-07-03 DIAGNOSIS — M9903 Segmental and somatic dysfunction of lumbar region: Secondary | ICD-10-CM | POA: Diagnosis not present

## 2022-07-03 DIAGNOSIS — M62830 Muscle spasm of back: Secondary | ICD-10-CM | POA: Diagnosis not present

## 2022-07-03 DIAGNOSIS — M9901 Segmental and somatic dysfunction of cervical region: Secondary | ICD-10-CM | POA: Diagnosis not present

## 2022-07-29 ENCOUNTER — Encounter: Payer: Self-pay | Admitting: Family

## 2022-07-29 ENCOUNTER — Ambulatory Visit (INDEPENDENT_AMBULATORY_CARE_PROVIDER_SITE_OTHER): Payer: Medicare Other | Admitting: Family

## 2022-07-29 VITALS — BP 111/62 | HR 87 | Temp 97.7°F | Ht 66.0 in | Wt 192.0 lb

## 2022-07-29 DIAGNOSIS — W57XXXA Bitten or stung by nonvenomous insect and other nonvenomous arthropods, initial encounter: Secondary | ICD-10-CM

## 2022-07-29 DIAGNOSIS — S30861A Insect bite (nonvenomous) of abdominal wall, initial encounter: Secondary | ICD-10-CM | POA: Diagnosis not present

## 2022-07-29 MED ORDER — PREDNISONE 20 MG PO TABS: 40.0000 mg | ORAL_TABLET | Freq: Every day | ORAL | 0 refills | Status: AC

## 2022-07-29 MED ORDER — TRIAMCINOLONE ACETONIDE 0.5 % EX OINT: 1.0000 | TOPICAL_OINTMENT | Freq: Two times a day (BID) | CUTANEOUS | 0 refills | Status: DC

## 2022-07-29 NOTE — Progress Notes (Signed)
Subjective:    Patient ID: Jenna Smith, female    DOB: [birth year], 71 y.o.   MRN: [id number]  Chief Complaint  Patient presents with   Insect Bite    2 DAYS AGO HAS TAKEN BENADRYL    Pt presents to the office today with insect sting two days ago after working outside. States she was bite/stung on her left lower abdomen.  Rash This is a new problem. The current episode started in the past 7 days. The problem has been gradually worsening since onset. The affected locations include the abdomen. The rash is characterized by itchiness and redness. She was exposed to an insect bite/sting. Associated symptoms include congestion. Pertinent negatives include no anorexia, fatigue, nail changes, shortness of breath or sore throat. Past treatments include antihistamine (benadryl). The treatment provided mild relief.      Review of Systems  Constitutional:  Negative for fatigue.  HENT:  Positive for congestion. Negative for sore throat.   Respiratory:  Negative for shortness of breath.   Gastrointestinal:  Negative for anorexia.  Skin:  Positive for rash. Negative for nail changes.  All other systems reviewed and are negative.      Objective:   Physical Exam Vitals reviewed.  Constitutional:      General: She is not in acute distress.    Appearance: She is well-developed.  HENT:     Head: Normocephalic and atraumatic.  Eyes:     Pupils: Pupils are equal, round, and reactive to light.  Neck:     Thyroid: No thyromegaly.  Cardiovascular:     Rate and Rhythm: Normal rate and regular rhythm.     Heart sounds: Normal heart sounds. No murmur heard. Pulmonary:     Effort: Pulmonary effort is normal. No respiratory distress.     Breath sounds: Normal breath sounds. No wheezing.  Abdominal:     General: Bowel sounds are normal. There is no distension.     Palpations: Abdomen is soft.     Tenderness: There is no abdominal tenderness.  Musculoskeletal:        General: No  tenderness. Normal range of motion.     Cervical back: Normal range of motion and neck supple.  Skin:    General: Skin is warm and dry.     Findings: Erythema present.          Comments: Erythemas 7.5X64 cm   Neurological:     Mental Status: She is alert and oriented to person, place, and time.     Cranial Nerves: No cranial nerve deficit.     Deep Tendon Reflexes: Reflexes are normal and symmetric.  Psychiatric:        Behavior: Behavior normal.        Thought Content: Thought content normal.        Judgment: Judgment normal.            Assessment & Plan:   Jenna Smith comes in today with chief complaint of Insect Bite (2 DAYS AGO HAS TAKEN BENADRYL )   Diagnosis and orders addressed:  1. Insect bite of abdominal wall, initial encounter -Avoid scratching, and report increased redness or fever. Go to ED with any SOB or facial swelling.  -Wear protective clothing while outside- Long sleeves and long pants -Put insect repellent on all exposed skin and along clothing -Take a shower as soon as possible after being outside -Follow up if symptoms worsen or do not improve  - predniSONE (DELTASONE) 20 MG tablet;  Take 2 tablets (40 mg total) by mouth daily with breakfast for 5 days.  Dispense: 10 tablet; Refill: 0 - triamcinolone ointment (KENALOG) 0.5 %; Apply 1 Application topically 2 (two) times daily.  Dispense: 30 g; Refill: 0    Jenna Rodney, FNP

## 2022-07-29 NOTE — Patient Instructions (Signed)
Insect Bite, Adult An insect bite can make your skin red, itchy, and swollen. An insect bite is different from an insect sting, which happens when an insect injects poison (venom) into the skin. Some insects can spread disease to people through a bite. However, most insect bites do not lead to disease and are not serious. What are the causes? Insects may bite for a variety of reasons, including: Hunger. To defend themselves. Insects that bite include: Spiders. Mosquitoes and flies. Ticks and fleas. Ants. Kissing bugs. Chiggers. What are the signs or symptoms? In many cases, symptoms last for 2-4 days. However, itching can last up to 10 days. Symptoms include: Itching or pain in the bite area. Redness and swelling in the bite area. An open wound (skin ulcer). In rare cases, a person may have a severe allergic reaction (anaphylactic reaction) to a bite. Symptoms of an anaphylactic reaction may include: Feeling warm in the face (flushed). This may include redness. Itchy, red, swollen areas of skin (hives). Swelling of the eyes, lips, face, mouth, tongue, or throat. Wheezing or difficulty breathing, speaking, or swallowing. Dizziness, light-headedness, or fainting. Abdominal symptoms like cramping, nausea, vomiting, or diarrhea. How is this diagnosed? This condition is usually diagnosed based on symptoms and a physical exam. During the exam, your health care provider will look at the bite and ask you what kind of insect bit you. How is this treated? Most insect bites are not serious. Symptoms often go away on their own and treatment is not usually needed. When treatment is recommended, it may include: Applying ice to the affected area. Applying steroid or other anti-itch creams, like calamine lotion, to the bite area. Medicines called antihistamines to reduce itching. You may also need: A tetanus shot if you are not up to date. Antibiotic cream or an oral antibiotic if the bite becomes  infected (this is uncommon). Follow these instructions at home: Bite area care  Do not scratch the bite area. It may help to cover the bite area with a bandage or close-fitting clothing. Keep the bite area clean and dry. Wash it every day with soap and water as told by your health care provider. Check the bite area every day for signs of infection. Check for: More redness, swelling, or pain. Fluid or blood. Warmth. Pus or a bad smell. Managing pain, itching, and swelling  You may apply cortisone cream, calamine lotion, or a paste made of baking soda and water to the bite area as told by your health care provider. If directed, put ice on the bite area. To do this: Put ice in a plastic bag. Place a towel between your skin and the bag. Leave the ice on for 20 minutes, 2-3 times a day. If your skin turns bright red, remove the ice right away to prevent skin damage. The risk of skin damage is higher if you cannot feel pain, heat, or cold. General instructions Apply or take over-the-counter and prescription medicine only as told by your health care provider. If you were prescribed antibiotics, take or apply them as told by your health care provider. Do not stop using the antibiotic even if you start to feel better. How is this prevented? To help reduce your risk of insect bites: When you are outdoors, wear clothing that covers your arms and legs. This is especially important in the early morning and evening. Use insect repellent. The best insect repellents contain DEET, picaridin, oil of lemon eucalyptus (OLE), or IR3535. Consider spraying your clothing   with a pesticide called permethrin. Permethrin helps prevent insect bites. It works for several weeks and for up to 5-6 clothing washes. Do not apply permethrin directly to the skin. If your home windows do not have screens, consider installing them. If you will be sleeping in an area where there are mosquitoes, consider covering your sleeping  area with a mosquito net. Contact a health care provider if: Your bite area has signs of infection, such as: More redness, swelling, or pain. Fluid or blood. Warmth. Pus or a bad smell. You have a fever. Get help right away if: You have a rash. You have muscle or joint pain. You feel unusually tired or weak. You have neck pain or a headache. You develop symptoms of an anaphylactic reaction. These may include: Swelling of the eyes, lips, face, mouth, tongue, or throat. Flushed skin or hives. Wheezing. Difficulty breathing, speaking, or swallowing. Dizziness, light-headedness, or fainting. Abdominal pain, cramping, vomiting, or diarrhea. These symptoms may be an emergency. Get help right away. Call 911. Do not wait to see if the symptoms will go away. Do not drive yourself to the hospital. Summary An insect bite can make your skin red, itchy, and swollen. Treatment is usually not needed. Symptoms often go away on their own. When treatment is recommended, it may involve taking medicine, applying medicine to the area, or applying ice. Apply or take over-the-counter and prescription medicines only as told by your health care provider. Use insect repellent to help prevent insect bites. Contact a health care provider if your bite area has signs of infection. This information is not intended to replace advice given to you by your health care provider. Make sure you discuss any questions you have with your health care provider. Document Revised: 01/23/2022 Document Reviewed: 01/08/2022 Elsevier Patient Education  2023 Elsevier Inc.  

## 2022-07-31 ENCOUNTER — Telehealth: Payer: Self-pay | Admitting: Family Medicine

## 2022-07-31 ENCOUNTER — Encounter: Payer: Self-pay | Admitting: Family Medicine

## 2022-07-31 ENCOUNTER — Ambulatory Visit (INDEPENDENT_AMBULATORY_CARE_PROVIDER_SITE_OTHER): Payer: Medicare Other | Admitting: Family Medicine

## 2022-07-31 VITALS — BP 126/81 | HR 74 | Temp 98.1°F | Ht 66.0 in | Wt 194.0 lb

## 2022-07-31 DIAGNOSIS — M545 Low back pain, unspecified: Secondary | ICD-10-CM | POA: Diagnosis not present

## 2022-07-31 DIAGNOSIS — J019 Acute sinusitis, unspecified: Secondary | ICD-10-CM | POA: Diagnosis not present

## 2022-07-31 DIAGNOSIS — W57XXXD Bitten or stung by nonvenomous insect and other nonvenomous arthropods, subsequent encounter: Secondary | ICD-10-CM

## 2022-07-31 DIAGNOSIS — G8929 Other chronic pain: Secondary | ICD-10-CM

## 2022-07-31 DIAGNOSIS — S30861D Insect bite (nonvenomous) of abdominal wall, subsequent encounter: Secondary | ICD-10-CM

## 2022-07-31 DIAGNOSIS — B9689 Other specified bacterial agents as the cause of diseases classified elsewhere: Secondary | ICD-10-CM | POA: Diagnosis not present

## 2022-07-31 MED ORDER — AZITHROMYCIN 250 MG PO TABS: ORAL_TABLET | ORAL | 0 refills | Status: DC

## 2022-07-31 MED ORDER — MELOXICAM 7.5 MG PO TABS: 7.5000 mg | ORAL_TABLET | Freq: Every day | ORAL | 0 refills | Status: DC | PRN

## 2022-07-31 NOTE — Telephone Encounter (Signed)
done

## 2022-07-31 NOTE — Telephone Encounter (Signed)
Lm making pt aware

## 2022-07-31 NOTE — Progress Notes (Signed)
Subjective: CC: Low back pain PCP: Janora Norlander, DO IEP:PIRJJOACZY A Stansel is a 71 y.o. female presenting to clinic today for:  1.  Low back pain Patient reports that she has been on prednisone for some back pain and that has helped.  She is not taking any oral NSAIDs but would be interested in this if this would help her back pain.  2.  Sinusitis Patient has been suffering from a sinus infection and notes that she is been having a foul discharge from her nose.  She smells foul odors associated with this.  Symptoms are refractory to conservative management.  Would like to be placed on an antibiotic.  No fevers, cough or congestion reported.   ROS: Per HPI  Allergies  Allergen Reactions   Bactrim [Sulfamethoxazole-Trimethoprim] Shortness Of Breath   Bee Venom Anaphylaxis   Prednisone     Pt states she thinks this was due to her taking prednisone with her pain medications-has taken prednisone since and didn't have any problems   Indomethacin Swelling    Face swells up   Avocado Hives   Bacitracin    Latex Hives   Neosporin [Neomycin-Bacitracin Zn-Polymyx] Hives   Other     Numerous food allergies: Broccoli, buckwheat, cabbage, cantaloupe, cinnamon, corn, polyps, lumen, molds, mushroom, black pepper, green pepper, pineapple, sweet potato, spinach, tomato, whole-wheat, whole egg, [place] meat, cow milk, crab, lobster, salmon, shellfish mix, coconut, black walnut.  *Patient states she continues to eat essentially all of these items.   Penicillins Hives    Has patient had a PCN reaction causing immediate rash, facial/tongue/throat swelling, SOB or lightheadedness with hypotension: no Has patient had a PCN reaction causing severe rash involving mucus membranes or skin necrosis: no Has patient had a PCN reaction that required hospitalization: no Has patient had a PCN reaction occurring within the last 10 years: no If all of the above answers are "NO", then may proceed with  Cephalosporin use.    Polymyxin B    Pomegranate (Punica Granatum) Hives   Past Medical History:  Diagnosis Date   Allergy    multiple food and environmental allergies   Anemia 12/22/2017   Arthritis     Current Outpatient Medications:    acetaminophen (TYLENOL) 650 MG CR tablet, Take 650 mg by mouth every 8 (eight) hours as needed for pain., Disp: , Rfl:    Biotin w/ Vitamins C & E (HAIR SKIN & NAILS GUMMIES PO), Take 2 each by mouth daily., Disp: , Rfl:    Calcium Carbonate-Vit D-Min (CALCIUM 1200) 1200-1000 MG-UNIT CHEW, Chew 2 tablets by mouth daily., Disp: , Rfl:    cetirizine (ZYRTEC) 10 MG tablet, Take 10 mg by mouth daily as needed for allergies., Disp: , Rfl:    Cholecalciferol (VITAMIN D3) 250 MCG (10000 UT) capsule, Take 10,000 Units by mouth daily., Disp: , Rfl:    EPINEPHrine 0.3 mg/0.3 mL IJ SOAJ injection, Inject 0.3 mg into the muscle as needed for anaphylaxis., Disp: 1 each, Rfl: 3   Magnesium Gluconate 550 MG TABS, , Disp: , Rfl:    methocarbamol (ROBAXIN) 500 MG tablet, Take 1 tablet (500 mg total) by mouth every 8 (eight) hours as needed for muscle spasms., Disp: 60 tablet, Rfl: 0   mupirocin ointment (BACTROBAN) 2 %, Apply to the affected area twice daily x5 days, Disp: 22 g, Rfl: 0   Omega-3 Fatty Acids (FISH OIL) 1000 MG CAPS, Take 2,000 mg by mouth in the morning, at noon, and  at bedtime. Relief Factor, Disp: , Rfl:    OVER THE COUNTER MEDICATION, Take 1 tablet by mouth daily. Zinc '100mg'$  Vitamin C '1000mg'$  Vitamin d3 1000 IU daily, Disp: , Rfl:    OVER THE COUNTER MEDICATION, Take 1 tablet by mouth daily. Magnesium 375 Potassium 450, Disp: , Rfl:    predniSONE (DELTASONE) 20 MG tablet, Take 2 tablets (40 mg total) by mouth daily with breakfast for 5 days., Disp: 10 tablet, Rfl: 0   TART CHERRY PO, Take by mouth., Disp: , Rfl:    triamcinolone ointment (KENALOG) 0.5 %, Apply 1 Application topically 2 (two) times daily., Disp: 30 g, Rfl: 0 Social History    Socioeconomic History   Marital status: Married    Spouse name: Richardson Landry   Number of children: 1   Years of education: 13   Highest education level: Some college, no degree  Occupational History   Occupation: retired  Tobacco Use   Smoking status: Former    Packs/day: 0.30    Years: 15.00    Total pack years: 4.50    Types: Cigarettes    Quit date: 10/28/1984    Years since quitting: 37.7   Smokeless tobacco: Never   Tobacco comments:    smoked "off and on" x 15 yrs  Vaping Use   Vaping Use: Never used  Substance and Sexual Activity   Alcohol use: Not Currently    Comment: Holidays - occasional wine   Drug use: No   Sexual activity: Yes    Birth control/protection: Post-menopausal  Other Topics Concern   Not on file  Social History Narrative   Not on file   Social Determinants of Health   Financial Resource Strain: Low Risk  (07/31/2021)   Overall Financial Resource Strain (CARDIA)    Difficulty of Paying Living Expenses: Not hard at all  Food Insecurity: No Food Insecurity (07/31/2021)   Hunger Vital Sign    Worried About Running Out of Food in the Last Year: Never true    [place] in the Last Year: Never true  Transportation Needs: No Transportation Needs (07/31/2021)   PRAPARE - Hydrologist (Medical): No    Lack of Transportation (Non-Medical): No  Physical Activity: Sufficiently Active (07/31/2021)   Exercise Vital Sign    Days of Exercise per Week: 4 days    Minutes of Exercise per Session: 60 min  Stress: No Stress Concern Present (07/31/2021)   [place]    Feeling of Stress : Not at all  Social Connections: Moderately Integrated (07/31/2021)   Social Connection and Isolation Panel [NHANES]    Frequency of Communication with Friends and Family: More than three times a week    Frequency of Social Gatherings with Friends and Family: More than three times a week     Attends Religious Services: Never    Marine scientist or Organizations: Yes    Attends Music therapist: More than 4 times per year    Marital Status: Married  Human resources officer Violence: Not At Risk (07/31/2021)   Humiliation, Afraid, Rape, and Kick questionnaire    Fear of Current or Ex-Partner: No    Emotionally Abused: No    Physically Abused: No    Sexually Abused: No   Family History  Problem Relation Age of Onset   Aneurysm Mother        passed away at age 34   Cancer  Father        passed away after Prostate cancer metastesize   Cancer Sister        she passed away 59 due to Breast Cancer   Stroke Sister    Aneurysm Sister    Colon cancer Neg Hx    Breast cancer Neg Hx     Objective: Office vital signs reviewed. BP 126/81   Pulse 74   Temp 98.1 F (36.7 C)   Ht '5\' 6"'$  (1.676 m)   Wt 194 lb (88 kg)   SpO2 92%   BMI 31.31 kg/m   Physical Examination:  General: Awake, alert, well nourished, No acute distress HEENT: Normal    Neck: No masses palpated. No lymphadenopathy    Ears: Tympanic membranes intact, normal light reflex, no erythema, no bulging    Eyes: PERRLA, extraocular membranes intact, sclera white    Nose: nasal turbinates moist, opaque nasal discharge    Throat: moist mucus membranes, no erythema, no tonsillar exudate.  Airway is patent Cardio: regular rate and rhythm, S1S2 heard, no murmurs appreciated Pulm: clear to auscultation bilaterally, no wheezes, rhonchi or rales; normal work of breathing on room air Skin: Has a healing hive noted along the left anterior lower abdomen.  There is no evidence of secondary infection. MSK: Ambulating independently with normal gait and station.  Assessment/ Plan: 71 y.o. female   Chronic right-sided low back pain without sciatica - Plan: meloxicam (MOBIC) 7.5 MG tablet  Insect bite of abdominal wall, subsequent encounter  Acute bacterial sinusitis - Plan: azithromycin (ZITHROMAX) 250 MG  tablet  Mobic ordered.  Advised to use no other NSAIDs while taking.  Okay to use Tylenol.  Continue heating.  May need to consider referral for physical therapy or orthopedic evaluation but she would like to hold off on this for now  Bee sting of her abdomen seems to be improving with the prednisone.  No evidence of secondary bacterial infection  For the bacterial sinusitis azithromycin was prescribed.  Home care instructions reviewed and reasons for reevaluation discussed  No orders of the defined types were placed in this encounter.  No orders of the defined types were placed in this encounter.    Janora Norlander, DO [place] [phone]

## 2022-08-07 ENCOUNTER — Ambulatory Visit (INDEPENDENT_AMBULATORY_CARE_PROVIDER_SITE_OTHER): Payer: Medicare Other | Admitting: Family Medicine

## 2022-08-07 ENCOUNTER — Encounter: Payer: Self-pay | Admitting: Family Medicine

## 2022-08-07 ENCOUNTER — Telehealth: Payer: Self-pay | Admitting: Family Medicine

## 2022-08-07 ENCOUNTER — Ambulatory Visit (HOSPITAL_COMMUNITY)
Admission: RE | Admit: 2022-08-07 | Discharge: 2022-08-07 | Disposition: A | Discharge status: Home / Self Care | Payer: Medicare Other | Source: Ambulatory Visit | Attending: Family Medicine | Admitting: Family Medicine

## 2022-08-07 VITALS — BP 117/72 | HR 76 | Temp 98.5°F | Ht 66.0 in | Wt 190.1 lb

## 2022-08-07 DIAGNOSIS — S0990XA Unspecified injury of head, initial encounter: Secondary | ICD-10-CM

## 2022-08-07 DIAGNOSIS — E669 Obesity, unspecified: Secondary | ICD-10-CM

## 2022-08-07 DIAGNOSIS — R42 Dizziness and giddiness: Secondary | ICD-10-CM | POA: Diagnosis not present

## 2022-08-07 MED ORDER — INSULIN SYRINGES (DISPOSABLE) U-100 1 ML MISC: 1.0000 | Freq: Every day | 2 refills | Status: DC

## 2022-08-07 NOTE — Telephone Encounter (Signed)
There is an order in for this.

## 2022-08-07 NOTE — Telephone Encounter (Signed)
[name] needs Jenna Smith to sign and send order for pt to have CT Head scan done.  Pt is there now.  Fax to [phone]

## 2022-08-07 NOTE — Telephone Encounter (Signed)
Tried to call radiology regarding order in Epic for CT but no answer-just kept ringing.

## 2022-08-07 NOTE — Telephone Encounter (Signed)
Hard faxed signed order to [facility].
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
## 2022-08-07 NOTE — Addendum Note (Signed)
Addended by: Gwenlyn Perking on: 08/07/2022 03:22 PM   Modules accepted: Orders

## 2022-08-07 NOTE — Patient Instructions (Signed)
Head Injury, Adult There are many types of head injuries. Head injuries can be as minor as a small bump, or they can be a serious medical issue. More severe head injuries include: A jarring injury to the brain (concussion). A bruise (contusion) of the brain. This means there is bleeding in the brain that can cause swelling. A cracked skull (skull fracture). Bleeding in the brain that collects, clots, and forms a bump (hematoma). After a head injury, most problems occur within the first 24 hours, but side effects may occur up to 7-10 days after the injury. It is important to watch your condition for any changes. You may need to be observed in the emergency department or urgent care, or you may be admitted to the hospital. What are the causes? There are many possible causes of a head injury. Serious head injuries may be caused by car accidents, bicycle or motorcycle accidents, sports injuries, falls, or being struck by an object. What are the symptoms? Symptoms of a head injury include a contusion, bump, or bleeding at the site of the injury. Other physical symptoms may include: Headache. Nausea or vomiting. Dizziness. Blurred or double vision. Being uncomfortable around bright lights or loud noises. Seizures. Feeling tired. Trouble being awakened. Loss of consciousness. Mental or emotional symptoms may include: Irritability. Confusion and memory problems. Poor attention and concentration. Changes in eating or sleeping habits. Anxiety or depression. How is this diagnosed? This condition can usually be diagnosed based on your symptoms, a description of the injury, and a physical exam. You may also have imaging tests done, such as a CT scan or an MRI. How is this treated? Treatment for this condition depends on the severity and type of injury you have. The main goal of treatment is to prevent complications and allow the brain time to heal. Mild head injury If you have a mild head injury,  you may be sent home, and treatment may include: Observation. A responsible adult should stay with you for 24 hours after your injury and check on you often. Physical rest. Brain rest. Pain medicines. Severe head injury If you have a severe head injury, treatment may include: Close observation. This includes hospitalization with the following care: Frequent physical exams. Frequent checks of how your brain and nervous system are working (neurological status). Checking your blood pressure and oxygen levels. Medicines to relieve pain, prevent seizures, and decrease brain swelling. Airway protection and breathing support. This may include using a ventilator. Treatments that monitor and manage swelling inside the brain. Brain surgery. This may be needed to: Remove a collection of blood or blood clots. Stop the bleeding. Remove a part of the skull to allow room for the brain to swell. Follow these instructions at home: Activity Rest and avoid activities that are physically hard or tiring. Make sure you get enough sleep. Let your brain rest by limiting activities that require a lot of thought or attention, such as: Watching TV. Playing memory games and puzzles. Job-related work or homework. Working on the computer, using social media, and texting. Avoid activities that could cause another head injury, such as playing sports, until your health care provider approves. Having another head injury, especially before the first one has healed, can be dangerous. Ask your health care provider when it is safe for you to return to your regular activities, including work or school. Ask your health care provider for a step-by-step plan for gradually returning to activities. Ask your health care provider when you can drive,   ride a bicycle, or use heavy machinery. Your ability to react may be slower after a brain injury. Do not do these activities if you are dizzy. Lifestyle  Do not drink alcohol until  your health care provider approves. Do not use drugs. Alcohol and certain drugs may slow your recovery and can put you at risk of further injury. If it is harder than usual to remember things, write them down. If you are easily distracted, try to do one thing at a time. Talk with family members or close friends when making important decisions. Tell your friends, family, a trusted colleague, and work manager about your injury, symptoms, and restrictions. Have them watch for any new or worsening problems. General instructions Take over-the-counter and prescription medicines only as told by your health care provider. Have someone stay with you for 24 hours after your head injury. This person should watch you for any changes in your symptoms and be ready to seek medical help. Keep all follow-up visits as told by your health care provider. This is important. How is this prevented? Work on improving your balance and strength to avoid falls. Wear a seat belt when you are in a moving vehicle. Wear a helmet when riding a bicycle, skiing, or doing any other sport or activity that has a risk of injury. If you drink alcohol: Limit how much you use to: 0-1 drink a day for nonpregnant women. 0-2 drinks a day for men. Be aware of how much alcohol is in your drink. In the [place] one drink equals one 12 oz bottle of beer (355 mL), one 5 oz glass of wine (148 mL), or one 1 oz glass of hard liquor (44 mL). Take safety measures in your home, such as: Removing clutter and tripping hazards from floors and stairways. Using grab bars in bathrooms and handrails by stairs. Placing non-slip mats on floors and in bathtubs. Improving lighting in dim areas. Where to find more information Centers for Disease Control and Prevention: www.cdc.gov Get help right away if: You have: A severe headache that is not helped by medicine. Trouble walking or weakness in your arms and legs. Clear or bloody fluid coming from your  nose or ears. Changes in your vision. A seizure. Increased confusion or irritability. Your symptoms get worse. You are sleepier than normal and have trouble staying awake. You lose your balance. Your pupils change size. Your speech is slurred. Your dizziness gets worse. You vomit. These symptoms may represent a serious problem that is an emergency. Do not wait to see if the symptoms will go away. Get medical help right away. Call your local emergency services (911 in the [place] Do not drive yourself to the hospital. Summary Head injuries can be minor, or they can be a serious medical issue requiring immediate attention. Treatment for this condition depends on the severity and type of injury you have. Have someone stay with you for 24 hours after your injury and check on you often. Ask your health care provider when it is safe for you to return to your regular activities, including work or school. Head injury prevention includes wearing a seat belt in a motor vehicle, using a helmet on a bicycle, limiting alcohol use, and taking safety measures in your home. This information is not intended to replace advice given to you by your health care provider. Make sure you discuss any questions you have with your health care provider. Document Revised: 08/27/2019 Document Reviewed: 08/27/2019 Elsevier Patient Education  2023   Elsevier Inc.  

## 2022-08-07 NOTE — Addendum Note (Signed)
Addended by: Milas Hock on: 08/07/2022 04:33 PM   Modules accepted: Orders

## 2022-08-15 ENCOUNTER — Other Ambulatory Visit: Payer: Self-pay | Admitting: Family Medicine

## 2022-08-15 DIAGNOSIS — Z1231 Encounter for screening mammogram for malignant neoplasm of breast: Secondary | ICD-10-CM

## 2022-09-02 DIAGNOSIS — M9903 Segmental and somatic dysfunction of lumbar region: Secondary | ICD-10-CM | POA: Diagnosis not present

## 2022-09-02 DIAGNOSIS — M9902 Segmental and somatic dysfunction of thoracic region: Secondary | ICD-10-CM | POA: Diagnosis not present

## 2022-09-02 DIAGNOSIS — M9901 Segmental and somatic dysfunction of cervical region: Secondary | ICD-10-CM | POA: Diagnosis not present

## 2022-09-02 DIAGNOSIS — M62830 Muscle spasm of back: Secondary | ICD-10-CM | POA: Diagnosis not present

## 2022-09-03 DIAGNOSIS — M62830 Muscle spasm of back: Secondary | ICD-10-CM | POA: Diagnosis not present

## 2022-09-03 DIAGNOSIS — M9902 Segmental and somatic dysfunction of thoracic region: Secondary | ICD-10-CM | POA: Diagnosis not present

## 2022-09-03 DIAGNOSIS — M9903 Segmental and somatic dysfunction of lumbar region: Secondary | ICD-10-CM | POA: Diagnosis not present

## 2022-09-03 DIAGNOSIS — M9901 Segmental and somatic dysfunction of cervical region: Secondary | ICD-10-CM | POA: Diagnosis not present

## 2022-09-10 ENCOUNTER — Ambulatory Visit (HOSPITAL_COMMUNITY): Payer: Medicare Other | Admitting: Physical Therapy

## 2022-10-02 ENCOUNTER — Ambulatory Visit
Admission: RE | Admit: 2022-10-02 | Discharge: 2022-10-02 | Disposition: A | Discharge status: Home / Self Care | Payer: Medicare Other | Source: Ambulatory Visit | Attending: Family Medicine | Admitting: Family Medicine

## 2022-10-02 DIAGNOSIS — Z1231 Encounter for screening mammogram for malignant neoplasm of breast: Secondary | ICD-10-CM

## 2022-10-07 DIAGNOSIS — M9901 Segmental and somatic dysfunction of cervical region: Secondary | ICD-10-CM | POA: Diagnosis not present

## 2022-10-07 DIAGNOSIS — M62830 Muscle spasm of back: Secondary | ICD-10-CM | POA: Diagnosis not present

## 2022-10-07 DIAGNOSIS — M9903 Segmental and somatic dysfunction of lumbar region: Secondary | ICD-10-CM | POA: Diagnosis not present

## 2022-10-07 DIAGNOSIS — M9902 Segmental and somatic dysfunction of thoracic region: Secondary | ICD-10-CM | POA: Diagnosis not present

## 2022-10-09 DIAGNOSIS — M9903 Segmental and somatic dysfunction of lumbar region: Secondary | ICD-10-CM | POA: Diagnosis not present

## 2022-10-09 DIAGNOSIS — M9901 Segmental and somatic dysfunction of cervical region: Secondary | ICD-10-CM | POA: Diagnosis not present

## 2022-10-09 DIAGNOSIS — M9902 Segmental and somatic dysfunction of thoracic region: Secondary | ICD-10-CM | POA: Diagnosis not present

## 2022-10-09 DIAGNOSIS — M62830 Muscle spasm of back: Secondary | ICD-10-CM | POA: Diagnosis not present

## 2022-10-17 ENCOUNTER — Telehealth: Payer: Self-pay | Admitting: Family Medicine

## 2022-10-17 NOTE — Telephone Encounter (Signed)
Patient declined the Medicare Wellness Visit with [facility] -doesn't feel she needs to complete due to seeing her provider regularly

## 2022-10-25 ENCOUNTER — Other Ambulatory Visit: Payer: Self-pay | Admitting: Family Medicine

## 2022-10-25 DIAGNOSIS — G8929 Other chronic pain: Secondary | ICD-10-CM

## 2023-01-20 DIAGNOSIS — M9902 Segmental and somatic dysfunction of thoracic region: Secondary | ICD-10-CM | POA: Diagnosis not present

## 2023-01-20 DIAGNOSIS — M9903 Segmental and somatic dysfunction of lumbar region: Secondary | ICD-10-CM | POA: Diagnosis not present

## 2023-01-20 DIAGNOSIS — M9901 Segmental and somatic dysfunction of cervical region: Secondary | ICD-10-CM | POA: Diagnosis not present

## 2023-01-20 DIAGNOSIS — M62830 Muscle spasm of back: Secondary | ICD-10-CM | POA: Diagnosis not present

## 2023-01-23 DIAGNOSIS — M9903 Segmental and somatic dysfunction of lumbar region: Secondary | ICD-10-CM | POA: Diagnosis not present

## 2023-01-23 DIAGNOSIS — M9901 Segmental and somatic dysfunction of cervical region: Secondary | ICD-10-CM | POA: Diagnosis not present

## 2023-01-23 DIAGNOSIS — M9902 Segmental and somatic dysfunction of thoracic region: Secondary | ICD-10-CM | POA: Diagnosis not present

## 2023-01-23 DIAGNOSIS — M62830 Muscle spasm of back: Secondary | ICD-10-CM | POA: Diagnosis not present

## 2023-01-28 DIAGNOSIS — M62830 Muscle spasm of back: Secondary | ICD-10-CM | POA: Diagnosis not present

## 2023-01-28 DIAGNOSIS — M9901 Segmental and somatic dysfunction of cervical region: Secondary | ICD-10-CM | POA: Diagnosis not present

## 2023-01-28 DIAGNOSIS — M9903 Segmental and somatic dysfunction of lumbar region: Secondary | ICD-10-CM | POA: Diagnosis not present

## 2023-01-28 DIAGNOSIS — M9902 Segmental and somatic dysfunction of thoracic region: Secondary | ICD-10-CM | POA: Diagnosis not present

## 2023-03-07 ENCOUNTER — Ambulatory Visit: Payer: Medicare Other | Admitting: Family Medicine

## 2023-03-10 ENCOUNTER — Encounter: Payer: Self-pay | Admitting: Family Medicine

## 2023-03-10 ENCOUNTER — Ambulatory Visit (INDEPENDENT_AMBULATORY_CARE_PROVIDER_SITE_OTHER): Payer: Medicare Other | Admitting: Family Medicine

## 2023-03-10 VITALS — BP 115/75 | HR 69 | Temp 98.3°F | Ht 66.0 in | Wt 179.0 lb

## 2023-03-10 DIAGNOSIS — R232 Flushing: Secondary | ICD-10-CM

## 2023-03-10 DIAGNOSIS — E663 Overweight: Secondary | ICD-10-CM | POA: Diagnosis not present

## 2023-03-10 DIAGNOSIS — J3489 Other specified disorders of nose and nasal sinuses: Secondary | ICD-10-CM

## 2023-03-10 DIAGNOSIS — W57XXXA Bitten or stung by nonvenomous insect and other nonvenomous arthropods, initial encounter: Secondary | ICD-10-CM

## 2023-03-10 DIAGNOSIS — M9902 Segmental and somatic dysfunction of thoracic region: Secondary | ICD-10-CM | POA: Diagnosis not present

## 2023-03-10 DIAGNOSIS — M9903 Segmental and somatic dysfunction of lumbar region: Secondary | ICD-10-CM | POA: Diagnosis not present

## 2023-03-10 DIAGNOSIS — M9901 Segmental and somatic dysfunction of cervical region: Secondary | ICD-10-CM | POA: Diagnosis not present

## 2023-03-10 DIAGNOSIS — M62830 Muscle spasm of back: Secondary | ICD-10-CM | POA: Diagnosis not present

## 2023-03-10 DIAGNOSIS — R6889 Other general symptoms and signs: Secondary | ICD-10-CM | POA: Diagnosis not present

## 2023-03-10 MED ORDER — DOXYCYCLINE HYCLATE 100 MG PO TABS
ORAL_TABLET | ORAL | 0 refills | Status: DC
Start: 2023-03-10 — End: 2023-08-19

## 2023-03-10 MED ORDER — METHYLPREDNISOLONE ACETATE 40 MG/ML IJ SUSP
40.0000 mg | Freq: Once | INTRAMUSCULAR | Status: AC
Start: 2023-03-10 — End: 2023-03-10
  Administered 2023-03-10: 40 mg via INTRAMUSCULAR

## 2023-03-10 MED ORDER — MUPIROCIN 2 % EX OINT: TOPICAL_OINTMENT | CUTANEOUS | 0 refills | Status: DC

## 2023-03-10 NOTE — Progress Notes (Signed)
Subjective: CC: Checkup PCP: Raliegh Ip, DO WUJ:[id number] A Burgueno is a 72 y.o. female presenting to clinic today for:  1.  Back pain Patient reports that she injured herself and has spasm along the entire right side of her body now.  She is going to be seeing a chiropractic doctor later this afternoon  2.  Flushing Patient reports that she has been experiencing quite a bit of flushing since coming off of the compounded semaglutide.  She discontinued this a couple of months ago and this seems to coincide with flushing got worse.  No reports of unplanned weight loss.  She has been actively trying to lose weight and has been using ginger and her coffee to do so.  She apparently stalled out with the semaglutide so discontinued use of it.  She reports no rectal bleeding, vaginal bleeding, breast concerns  3.  Tick bites Patient would like to get prophylactic doxycycline again for tick bites since this season has started.  No active lesions currently   ROS: Per HPI  Allergies  Allergen Reactions   Bactrim [Sulfamethoxazole-Trimethoprim] Shortness Of Breath   Bee Venom Anaphylaxis   Prednisone     Pt states she thinks this was due to her taking prednisone with her pain medications-has taken prednisone since and didn't have any problems   Indomethacin Swelling    Face swells up   Avocado Hives   Bacitracin    Latex Hives   Neosporin [Neomycin-Bacitracin Zn-Polymyx] Hives   Other     Numerous food allergies: Broccoli, buckwheat, cabbage, cantaloupe, cinnamon, corn, polyps, lumen, molds, mushroom, black pepper, green pepper, pineapple, sweet potato, spinach, tomato, whole-wheat, whole egg, [place] meat, cow milk, crab, lobster, salmon, shellfish mix, coconut, black walnut.  *Patient states she continues to eat essentially all of these items.   Penicillins Hives    Has patient had a PCN reaction causing immediate rash, facial/tongue/throat swelling, SOB or lightheadedness with  hypotension: no Has patient had a PCN reaction causing severe rash involving mucus membranes or skin necrosis: no Has patient had a PCN reaction that required hospitalization: no Has patient had a PCN reaction occurring within the last 10 years: no If all of the above answers are "NO", then may proceed with Cephalosporin use.    Polymyxin B    Pomegranate (Punica Granatum) Hives   Past Medical History:  Diagnosis Date   Allergy    multiple food and environmental allergies   Anemia 12/22/2017   Arthritis     Current Outpatient Medications:    acetaminophen (TYLENOL) 650 MG CR tablet, Take 650 mg by mouth every 8 (eight) hours as needed for pain., Disp: , Rfl:    Biotin w/ Vitamins C & E (HAIR SKIN & NAILS GUMMIES PO), Take 2 each by mouth daily., Disp: , Rfl:    Calcium Carbonate-Vit D-Min (CALCIUM 1200) 1200-1000 MG-UNIT CHEW, Chew 2 tablets by mouth daily., Disp: , Rfl:    cetirizine (ZYRTEC) 10 MG tablet, Take 10 mg by mouth daily as needed for allergies., Disp: , Rfl:    Cholecalciferol (VITAMIN D3) 250 MCG (10000 UT) capsule, Take 10,000 Units by mouth daily., Disp: , Rfl:    EPINEPHrine 0.3 mg/0.3 mL IJ SOAJ injection, Inject 0.3 mg into the muscle as needed for anaphylaxis., Disp: 1 each, Rfl: 3   meloxicam (MOBIC) 7.5 MG tablet, TAKE 1 TABLET BY MOUTH ONCE DAILY AS NEEDED FOR ARTHRITIS OR BACK PAIN, Disp: 90 tablet, Rfl: 3   methocarbamol (ROBAXIN) 500  MG tablet, Take 1 tablet (500 mg total) by mouth every 8 (eight) hours as needed for muscle spasms., Disp: 60 tablet, Rfl: 0   mupirocin ointment (BACTROBAN) 2 %, Apply to the affected area twice daily x5 days, Disp: 22 g, Rfl: 0   Omega-3 Fatty Acids (FISH OIL) 1000 MG CAPS, Take 2,000 mg by mouth in the morning, at noon, and at bedtime. Relief Factor, Disp: , Rfl:    OVER THE COUNTER MEDICATION, Take 1 tablet by mouth daily. Zinc 100mg  Vitamin C 1000mg  Vitamin d3 1000 IU daily, Disp: , Rfl:    OVER THE COUNTER MEDICATION, Take 1  tablet by mouth daily. Magnesium 375 Potassium 450, Disp: , Rfl:    SEMAGLUTIDE PO, Sublingual 1mg /1mg /ml Susp, Disp: , Rfl:    triamcinolone ointment (KENALOG) 0.5 %, Apply 1 Application topically 2 (two) times daily., Disp: 30 g, Rfl: 0 Social History   Socioeconomic History   Marital status: Married    Spouse name: Brett Canales   Number of children: 1   Years of education: 13   Highest education level: Some college, no degree  Occupational History   Occupation: retired  Tobacco Use   Smoking status: Former    Packs/day: 0.30    Years: 15.00    Additional pack years: 0.00    Total pack years: 4.50    Types: Cigarettes    Quit date: 10/28/1984    Years since quitting: 38.3   Smokeless tobacco: Never   Tobacco comments:    smoked "off and on" x 15 yrs  Vaping Use   Vaping Use: Never used  Substance and Sexual Activity   Alcohol use: Not Currently    Comment: Holidays - occasional wine   Drug use: No   Sexual activity: Yes    Birth control/protection: Post-menopausal  Other Topics Concern   Not on file  Social History Narrative   Not on file   Social Determinants of Health   Financial Resource Strain: Low Risk  (03/06/2023)   Overall Financial Resource Strain (CARDIA)    Difficulty of Paying Living Expenses: Not hard at all  Food Insecurity: No Food Insecurity (03/06/2023)   Hunger Vital Sign    Worried About Running Out of Food in the Last Year: Never true    Ran Out of Food in the Last Year: Never true  Transportation Needs: Unknown (03/06/2023)   PRAPARE - Administrator, Civil Service (Medical): No    Lack of Transportation (Non-Medical): Not on file  Physical Activity: Unknown (03/06/2023)   Exercise Vital Sign    Days of Exercise per Week: 7 days    Minutes of Exercise per Session: Patient declined  Stress: No Stress Concern Present (03/06/2023)   [employer] of Occupational Health - Occupational Stress Questionnaire    Feeling of Stress : Not at all   Social Connections: Moderately Isolated (03/06/2023)   Social Connection and Isolation Panel [NHANES]    Frequency of Communication with Friends and Family: More than three times a week    Frequency of Social Gatherings with Friends and Family: More than three times a week    Attends Religious Services: Never    Database administrator or Organizations: No    Attends Banker Meetings: Not on file    Marital Status: Married  Catering manager Violence: Not At Risk (07/31/2021)   Humiliation, Afraid, Rape, and Kick questionnaire    Fear of Current or Ex-Partner: No    Emotionally Abused:  No    Physically Abused: No    Sexually Abused: No   Family History  Problem Relation Age of Onset   Aneurysm Mother        passed away at age 32   Cancer Father        passed away after Prostate cancer metastesize   Cancer Sister        she passed away 74 due to Breast Cancer   Stroke Sister    Aneurysm Sister    Colon cancer Neg Hx    Breast cancer Neg Hx     Objective: Office vital signs reviewed. BP 115/75   Pulse 69   Temp 98.3 F (36.8 C)   Ht 5\' 6"  (1.676 m)   Wt 179 lb (81.2 kg)   SpO2 94%   BMI 28.89 kg/m   Physical Examination:  General: Awake, alert, well nourished, No acute distress HEENT: sclera white, MMM Cardio: regular rate and rhythm, S1S2 heard, no murmurs appreciated Pulm: clear to auscultation bilaterally, no wheezes, rhonchi or rales; normal work of breathing on room air MSK: Very antalgic gait.  She has quite a bit of spasm on the right side of her thoracic and lumbar spine.  She is ambulating independently  Assessment/ Plan: 72 y.o. female   Flushing - Plan: TSH, T4, Free, CBC  Overweight (BMI 25.0-29.9) - Plan: Lipid Panel, CMP14+EGFR  Nasal vestibulitis - Plan: mupirocin ointment (BACTROBAN) 2 %  Tick bite, unspecified site, initial encounter - Plan: doxycycline (VIBRA-TABS) 100 MG tablet  Lumbar paraspinal muscle spasm - Plan:  methylPREDNISolone acetate (DEPO-MEDROL) injection 40 mg  Check thyroid levels, CBC given reports of intermittent flushing.  Check fasting lipid, kidney function, metabolic panel  Bactroban renewed for intermittent flareup of nasal lesions  Doxycycline prophylaxis prescribed.  Educations for use discussed  Depo-Medrol administered for muscle spasm.  She will be seeing chiropractic medicine this afternoon  No orders of the defined types were placed in this encounter.  No orders of the defined types were placed in this encounter.    Raliegh Ip, DO [facility] [phone]

## 2023-03-11 ENCOUNTER — Other Ambulatory Visit: Payer: Self-pay | Admitting: Family Medicine

## 2023-03-11 DIAGNOSIS — M62830 Muscle spasm of back: Secondary | ICD-10-CM | POA: Diagnosis not present

## 2023-03-11 DIAGNOSIS — M9902 Segmental and somatic dysfunction of thoracic region: Secondary | ICD-10-CM | POA: Diagnosis not present

## 2023-03-11 DIAGNOSIS — M9901 Segmental and somatic dysfunction of cervical region: Secondary | ICD-10-CM | POA: Diagnosis not present

## 2023-03-11 DIAGNOSIS — M9903 Segmental and somatic dysfunction of lumbar region: Secondary | ICD-10-CM | POA: Diagnosis not present

## 2023-03-11 LAB — LIPID PANEL
Chol/HDL Ratio: 3.1 ratio (ref 0.0–4.4)
Cholesterol, Total: 258 mg/dL — ABNORMAL HIGH (ref 100–199)
HDL: 83 mg/dL (ref >39)
LDL Chol Calc (NIH): 158 mg/dL — ABNORMAL HIGH (ref 0–99)
Triglycerides: 99 mg/dL (ref 0–149)
VLDL Cholesterol Cal: 17 mg/dL (ref 5–40)

## 2023-03-11 LAB — CBC
Hematocrit: 42.3 % (ref 34.0–46.6)
Hemoglobin: 14.1 g/dL (ref 11.1–15.9)
MCH: 29.5 pg (ref 26.6–33.0)
MCHC: 33.3 g/dL (ref 31.5–35.7)
MCV: 89 fL (ref 79–97)
Platelets: 251 10*3/uL (ref 150–450)
RBC: 4.78 x10E6/uL (ref 3.77–5.28)
RDW: 14.3 % (ref 11.7–15.4)
WBC: 8.7 10*3/uL (ref 3.4–10.8)

## 2023-03-11 LAB — TSH: TSH: 2.33 u[IU]/mL (ref 0.450–4.500)

## 2023-03-11 LAB — CMP14+EGFR
ALT: 14 IU/L (ref 0–32)
AST: 19 IU/L (ref 0–40)
Albumin/Globulin Ratio: 1.8 (ref 1.2–2.2)
Albumin: 4.2 g/dL (ref 3.9–4.9)
Alkaline Phosphatase: 110 IU/L (ref 44–121)
BUN/Creatinine Ratio: 33 — ABNORMAL HIGH (ref 12–28)
BUN: 27 mg/dL (ref 8–27)
Bilirubin Total: 0.3 mg/dL (ref 0.0–1.2)
CO2: 24 mmol/L (ref 20–29)
Calcium: 9.5 mg/dL (ref 8.7–10.3)
Chloride: 103 mmol/L (ref 96–106)
Creatinine, Ser: 0.81 mg/dL (ref 0.57–1.00)
Globulin, Total: 2.3 g/dL (ref 1.5–4.5)
Glucose: 92 mg/dL (ref 70–99)
Potassium: 4.4 mmol/L (ref 3.5–5.2)
Sodium: 141 mmol/L (ref 134–144)
Total Protein: 6.5 g/dL (ref 6.0–8.5)
eGFR: 79 mL/min/{1.73_m2} (ref >59)

## 2023-03-11 LAB — T4, FREE: Free T4: 1.26 ng/dL (ref 0.82–1.77)

## 2023-03-13 DIAGNOSIS — M9903 Segmental and somatic dysfunction of lumbar region: Secondary | ICD-10-CM | POA: Diagnosis not present

## 2023-03-13 DIAGNOSIS — M9902 Segmental and somatic dysfunction of thoracic region: Secondary | ICD-10-CM | POA: Diagnosis not present

## 2023-03-13 DIAGNOSIS — M62830 Muscle spasm of back: Secondary | ICD-10-CM | POA: Diagnosis not present

## 2023-03-13 DIAGNOSIS — M9901 Segmental and somatic dysfunction of cervical region: Secondary | ICD-10-CM | POA: Diagnosis not present

## 2023-03-17 DIAGNOSIS — M9902 Segmental and somatic dysfunction of thoracic region: Secondary | ICD-10-CM | POA: Diagnosis not present

## 2023-03-17 DIAGNOSIS — M62830 Muscle spasm of back: Secondary | ICD-10-CM | POA: Diagnosis not present

## 2023-03-17 DIAGNOSIS — M9903 Segmental and somatic dysfunction of lumbar region: Secondary | ICD-10-CM | POA: Diagnosis not present

## 2023-03-17 DIAGNOSIS — M9901 Segmental and somatic dysfunction of cervical region: Secondary | ICD-10-CM | POA: Diagnosis not present

## 2023-03-20 DIAGNOSIS — M9901 Segmental and somatic dysfunction of cervical region: Secondary | ICD-10-CM | POA: Diagnosis not present

## 2023-03-20 DIAGNOSIS — M9902 Segmental and somatic dysfunction of thoracic region: Secondary | ICD-10-CM | POA: Diagnosis not present

## 2023-03-20 DIAGNOSIS — M9903 Segmental and somatic dysfunction of lumbar region: Secondary | ICD-10-CM | POA: Diagnosis not present

## 2023-03-20 DIAGNOSIS — M62830 Muscle spasm of back: Secondary | ICD-10-CM | POA: Diagnosis not present

## 2023-05-07 ENCOUNTER — Telehealth: Payer: Self-pay | Admitting: Family Medicine

## 2023-05-07 DIAGNOSIS — R42 Dizziness and giddiness: Secondary | ICD-10-CM

## 2023-05-07 NOTE — Telephone Encounter (Signed)
Could not find where patient saw specialist in chart for this specifically other than [facility]. Please review and advise

## 2023-05-07 NOTE — Telephone Encounter (Signed)
REFERRAL REQUEST Telephone Note  Have you been seen at our office for this problem? Yes - May 31st (Advise that they may need an appointment with their PCP before a referral can be done)  Reason for Referral: Vertigo Referral discussed with patient: Talked about it at last appt  Best contact number of patient for referral team: [phone]    Has patient been seen by a specialist for this issue before: YES  Patient provider preference for referral: Can't remember name of specialist she seen in [place]. Patient location preference for referral: [place]   Patient notified that referrals can take up to a week or longer to process. If they haven't heard anything within a week they should call back and speak with the referral department.  Ver

## 2023-05-07 NOTE — Telephone Encounter (Signed)
Looks like Tiffany referred her to [facility] vestibular rehab back in 07/2022.   Order replaced for her  Orders Placed This Encounter  Procedures   Ambulatory referral to Physical Therapy    Referral Priority:   Routine    Referral Type:   Physical Medicine    Referral Reason:   Specialty Services Required    Requested Specialty:   Physical Therapy    Number of Visits Requested:   1

## 2023-05-07 NOTE — Telephone Encounter (Signed)
Left detailed message on patients voicemail that referral had been placed and she should hear from our referral department within a week. Advised to contact office with any further questions

## 2023-06-06 NOTE — Therapy (Signed)
OUTPATIENT PHYSICAL THERAPY VESTIBULAR EVALUATION     Patient Name: Jenna Smith MRN: [id number] DOB:Aug 26, 1951, 72 y.o., female Today's Date: 06/06/2023  END OF SESSION:   Past Medical History:  Diagnosis Date   Allergy    multiple food and environmental allergies   Anemia 12/22/2017   Arthritis    Past Surgical History:  Procedure Laterality Date   BREAST SURGERY Bilateral 02/2000   Breast reduction   COLONOSCOPY WITH PROPOFOL N/A 05/29/2021   Surgeon: Earnest Bailey K, DO; Nonbleeding internal hemorrhoids, 10 mm polyp in the cecum.  Pathology revealed tubular adenoma.  Recommended repeat colonoscopy in 3 years.   COSMETIC SURGERY  06/2011   Face lift   ESOPHAGOGASTRODUODENOSCOPY (EGD) WITH PROPOFOL N/A 05/29/2021   Surgeon: Lanelle Bal, DO; Gastritis biopsied, normal esophagus and duodenum. Pathology with reactive gastropathy, negative for H. pylori.   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  ONSET DATE: ***  Rationale for  Evaluation and Treatment: {HABREHAB:27488}  SUBJECTIVE:   SUBJECTIVE STATEMENT: *** Pt accompanied by: {accompnied:27141}  PERTINENT HISTORY: ***  PAIN:  Are you having pain? {OPRCPAIN:27236}  PRECAUTIONS: {Therapy precautions:24002}  RED FLAGS: {PT Red Flags:29287}   WEIGHT BEARING RESTRICTIONS: {Yes ***/No:24003}  FALLS: Has patient fallen in last 6 months? {fallsyesno:27318}  LIVING ENVIRONMENT: Lives with: {OPRC lives with:25569::"lives with their family"} Lives in: {Lives in:25570} Stairs: {opstairs:27293} Has following equipment at home: {Assistive devices:23999}  PLOF: {PLOF:24004}  PATIENT GOALS: ***  OBJECTIVE:   DIAGNOSTIC FINDINGS: ***  COGNITION: Overall cognitive status: {cognition:24006}   SENSATION: {sensation:27233}  EDEMA:  {edema:24020}  MUSCLE TONE:  {LE tone:25568}  DTRs:  {DTR SITE:24025}  POSTURE:  {posture:25561}  Cervical ROM:    {AROM/PROM:27142} A/PROM (deg) eval  Flexion   Extension   Right lateral flexion   Left lateral flexion   Right rotation   Left rotation   (Blank rows = not tested)  STRENGTH: ***  LOWER EXTREMITY MMT:   MMT Right eval Left eval  Hip flexion    Hip abduction    Hip adduction    Hip internal rotation    Hip external rotation    Knee flexion    Knee extension    Ankle dorsiflexion    Ankle plantarflexion    Ankle inversion    Ankle eversion    (Blank rows = not tested)  BED MOBILITY:  {Bed mobility:24027}  TRANSFERS: Assistive device utilized: {Assistive devices:23999}  Sit to stand: {Levels of assistance:24026} Stand to sit: {Levels of assistance:24026} Chair to chair: {Levels of assistance:24026} Floor: {Levels of  assistance:24026}  RAMP: {Levels of assistance:24026}  CURB: {Levels of assistance:24026}  GAIT: Gait pattern: {gait characteristics:25376} Distance walked: *** Assistive device utilized: {Assistive devices:23999} Level of assistance: {Levels of  assistance:24026} Comments: ***  FUNCTIONAL TESTS:  {Functional tests:24029}  PATIENT SURVEYS:  {rehab surveys:24030}  VESTIBULAR ASSESSMENT:  GENERAL OBSERVATION: ***   SYMPTOM BEHAVIOR:  Subjective history: ***  Non-Vestibular symptoms: {nonvestibular symptoms:25260}  Type of dizziness: {Type of Dizziness:25255}  Frequency: ***  Duration: ***  Aggravating factors: {Aggravating Factors:25258}  Relieving factors: {Relieving Factors:25259}  Progression of symptoms: {DESC; BETTER/WORSE:18575}  OCULOMOTOR EXAM:  Ocular Alignment: {Ocular Alignment:25262}  Ocular ROM: {RANGE OF MOTION:21649}  Spontaneous Nystagmus: {Spontaneous nystagmus:25263}  Gaze-Induced Nystagmus: {gaze-induced nystagmus:25264}  Smooth Pursuits: {smooth pursuit:25265}  Saccades: {saccades:25266}  Convergence/Divergence: *** cm   FRENZEL - FIXATION SUPRESSED:  Ocular Alignment: {Ocular Alignment:25262}  Spontaneous Nystagmus: {Spontaneous nystagmus:25263}  Gaze-Induced Nystagmus: {gaze-induced nystagmus:25264}  Horizontal head shaking - induced nystagmus: {head shaking induced nystagmus:25267}  Vertical head shaking - induced nystagmus: {head shaking induced nystagmus:25267}  Positional tests: {Positional tests:25271}  Pressure tests: {frenzel pressure tests:25268}  VESTIBULAR - OCULAR REFLEX:   Slow VOR: {slow VOR:25290}  VOR Cancellation: {vor cancellation:25291}  Head-Impulse Test: {head impulse test:25272}  Dynamic Visual Acuity: {dynamic visual acuity:25273}   POSITIONAL TESTING: {Positional tests:25271}  MOTION SENSITIVITY:  Motion Sensitivity Quotient Intensity: 0 = none, 1 = Lightheaded, 2 = Mild, 3 = Moderate, 4 = Severe, 5 = Vomiting  Intensity  1. Sitting to supine   2. Supine to L side   3. Supine to R side   4. Supine to sitting   5. L Hallpike-Dix   6. Up from L    7. R Hallpike-Dix   8. Up from R    9. Sitting, head tipped to L knee   10. Head up from L knee   11. Sitting,  head tipped to R knee   12. Head up from R knee   13. Sitting head turns x5   14.Sitting head nods x5   15. In stance, 180 turn to L    16. In stance, 180 turn to R     OTHOSTATICS: {Exam; orthostatics:31331}  FUNCTIONAL GAIT: {Functional tests:24029}   VESTIBULAR TREATMENT:                                                                                                   DATE: ***  Canalith Repositioning:  {Canalith Repositioning:25283} Gaze Adaptation:  {gaze adaptation:25286} Habituation:  {habituation:25288} Other: ***  PATIENT EDUCATION: Education details: *** Person educated: {Person educated:25204} Education method: {Education Method:25205} Education comprehension: {Education Comprehension:25206}  HOME EXERCISE PROGRAM:  GOALS: Goals reviewed with patient? {yes/no:20286}  SHORT TERM GOALS: Target date: ***  *** Baseline: Goal status: {GOALSTATUS:25110}  2.  *** Baseline:  Goal status: {GOALSTATUS:25110}  3.  *** Baseline:  Goal status: {GOALSTATUS:25110}  4.  *** Baseline:  Goal status: {GOALSTATUS:25110}  5.  *** Baseline:  Goal status: {GOALSTATUS:25110}  6.  *** Baseline:  Goal status: {GOALSTATUS:25110}  LONG TERM GOALS: Target date: ***  *** Baseline:  Goal status: {GOALSTATUS:25110}  2.  ***  Baseline:  Goal status: {GOALSTATUS:25110}  3.  *** Baseline:  Goal status: {GOALSTATUS:25110}  4.  *** Baseline:  Goal status: {GOALSTATUS:25110}  5.  *** Baseline:  Goal status: {GOALSTATUS:25110}  6.  *** Baseline:  Goal status: {GOALSTATUS:25110}  ASSESSMENT:  CLINICAL IMPRESSION: Patient is a *** y.o. *** who was seen today for physical therapy evaluation and treatment for ***.   OBJECTIVE IMPAIRMENTS: {opptimpairments:25111}.   ACTIVITY LIMITATIONS: {activitylimitations:27494}  PARTICIPATION LIMITATIONS: {participationrestrictions:25113}  PERSONAL FACTORS: {Personal factors:25162} are also affecting patient's  functional outcome.   REHAB POTENTIAL: {rehabpotential:25112}  CLINICAL DECISION MAKING: {clinical decision making:25114}  EVALUATION COMPLEXITY: {Evaluation complexity:25115}   PLAN:  PT FREQUENCY: {rehab frequency:25116}  PT DURATION: {rehab duration:25117}  PLANNED INTERVENTIONS: {rehab planned interventions:25118::"Therapeutic exercises","Therapeutic activity","Neuromuscular re-education","Balance training","Gait training","Patient/Family education","Self Care","Joint mobilization"}  PLAN FOR NEXT SESSION: ***   Marisue Brooklyn, PT 06/06/2023, 7:41 AM

## 2023-06-09 ENCOUNTER — Other Ambulatory Visit: Payer: Self-pay

## 2023-06-09 ENCOUNTER — Ambulatory Visit (HOSPITAL_COMMUNITY): Payer: Medicare Other | Attending: Family Medicine

## 2023-06-09 DIAGNOSIS — R42 Dizziness and giddiness: Secondary | ICD-10-CM | POA: Insufficient documentation

## 2023-06-18 ENCOUNTER — Encounter (HOSPITAL_COMMUNITY): Payer: PRIVATE HEALTH INSURANCE | Admitting: Physical Therapy

## 2023-06-20 ENCOUNTER — Ambulatory Visit: Payer: Medicare Other

## 2023-06-20 DIAGNOSIS — S30861A Insect bite (nonvenomous) of abdominal wall, initial encounter: Secondary | ICD-10-CM | POA: Diagnosis not present

## 2023-06-20 DIAGNOSIS — W57XXXA Bitten or stung by nonvenomous insect and other nonvenomous arthropods, initial encounter: Secondary | ICD-10-CM | POA: Diagnosis not present

## 2023-06-20 DIAGNOSIS — S50869A Insect bite (nonvenomous) of unspecified forearm, initial encounter: Secondary | ICD-10-CM | POA: Diagnosis not present

## 2023-06-20 NOTE — Progress Notes (Signed)
Ok to use claritin daily if needed for itching. Calamine lotion or topical hydrocortisone cream it lesions if itchy

## 2023-06-20 NOTE — Telephone Encounter (Signed)
-----   Message from Maxwell sent at 06/20/2023  2:24 PM EDT -----    ----- Message ----- From: Angela Adam, RMA Sent: 06/20/2023  11:16 AM EDT To: Raliegh Ip, DO

## 2023-06-20 NOTE — Progress Notes (Unsigned)
Patient came in for questionable hives.   Looked at patients skin and there were bug bites on her back and one on her face.  Patient stated that they itched and she is outside a lot.  No SOB or trouble breathing.  Had Angelica Chessman look behind me who agreed they were bug bites and not hives.  No openings - advised go to urgent care or offered an appointment for Monday.  Patient states she would go to urgent care

## 2023-06-20 NOTE — Progress Notes (Signed)
Patient aware and verbalizes understanding.  States she went to urgent care

## 2023-07-25 ENCOUNTER — Ambulatory Visit (INDEPENDENT_AMBULATORY_CARE_PROVIDER_SITE_OTHER): Payer: Medicare Other | Admitting: Family Medicine

## 2023-07-25 ENCOUNTER — Encounter: Payer: Self-pay | Admitting: Family Medicine

## 2023-07-25 VITALS — BP 116/66 | HR 83 | Temp 98.8°F | Ht 66.0 in | Wt 175.0 lb

## 2023-07-25 DIAGNOSIS — M25512 Pain in left shoulder: Secondary | ICD-10-CM

## 2023-07-25 DIAGNOSIS — M2550 Pain in unspecified joint: Secondary | ICD-10-CM

## 2023-07-25 DIAGNOSIS — G8929 Other chronic pain: Secondary | ICD-10-CM

## 2023-07-25 DIAGNOSIS — M546 Pain in thoracic spine: Secondary | ICD-10-CM | POA: Diagnosis not present

## 2023-07-25 MED ORDER — CELECOXIB 100 MG PO CAPS
100.0000 mg | ORAL_CAPSULE | Freq: Two times a day (BID) | ORAL | 3 refills | Status: DC
Start: 2023-07-25 — End: 2023-11-07

## 2023-07-25 NOTE — Patient Instructions (Signed)
You have prescribed a nonsteroidal anti-inflammatory drug (NSAID) today. This will help with your pain and inflammation. Please do not take any other NSAIDs (ibuprofen/Motrin/Advil, naproxen/Aleve, meloxicam/Mobic, Voltaren/diclofenac). Please make sure to eat a meal when taking this medication.   Caution:  If you have a history of acid reflux/indigestion, I recommend that you take an antacid (such as Prilosec, Prevacid) daily while on the NSAID.  If you have a history of bleeding disorder, gastric ulcer, are on a blood thinner (like warfarin/Coumadin, Xarelto, Eliquis, etc) please do not take NSAID.  If you have ever had a heart attack, you should not take NSAIDs.

## 2023-07-25 NOTE — Progress Notes (Signed)
Subjective: CC: Polyarthralgia PCP: Raliegh Ip, DO LKG:[id number] Jenna Smith is Jenna 72 y.o. female presenting to clinic today for:  1.  Polyarthralgia Patient continues to have polyarthralgia.  Her main concerns are her lower back/mid back on the right and left shoulder.  She is very physically active and does lots of bench presses.  However her left shoulder is difficult to flex.  She has been taking her Mobic 7.5.  Sometimes she takes 2 and really yesterday she notes that she took up to 4 of these.  She reports reports Jenna burning pain that occurs in her right mid back and sometimes even in her hand.     She  is accompanied today by her grandson   ROS: Per HPI  Allergies  Allergen Reactions   Bactrim [Sulfamethoxazole-Trimethoprim] Shortness Of Breath   Bee Venom Anaphylaxis   Indomethacin Swelling    Face swells up   Avocado Hives   Bacitracin    Latex Hives   Neosporin [Neomycin-Bacitracin Zn-Polymyx] Hives   Other     Numerous food allergies: Broccoli, buckwheat, cabbage, cantaloupe, cinnamon, corn, polyps, lumen, molds, mushroom, black pepper, green pepper, pineapple, sweet potato, spinach, tomato, whole-wheat, whole egg, [place] meat, cow milk, crab, lobster, salmon, shellfish mix, coconut, black walnut.  *Patient states she continues to eat essentially all of these items.   Penicillins Hives    Has patient had Jenna PCN reaction causing immediate rash, facial/tongue/throat swelling, SOB or lightheadedness with hypotension: no Has patient had Jenna PCN reaction causing severe rash involving mucus membranes or skin necrosis: no Has patient had Jenna PCN reaction that required hospitalization: no Has patient had Jenna PCN reaction occurring within the last 10 years: no If all of the above answers are "NO", then may proceed with Cephalosporin use.    Polymyxin B    Pomegranate (Punica Granatum) Hives   Past Medical History:  Diagnosis Date   Allergy    multiple food and  environmental allergies   Anemia 12/22/2017   Arthritis     Current Outpatient Medications:    acetaminophen (TYLENOL) 650 MG CR tablet, Take 650 mg by mouth every 8 (eight) hours as needed for pain., Disp: , Rfl:    Biotin w/ Vitamins C & E (HAIR SKIN & NAILS GUMMIES PO), Take 2 each by mouth daily., Disp: , Rfl:    Calcium Carbonate-Vit D-Min (CALCIUM 1200) 1200-1000 MG-UNIT CHEW, Chew 2 tablets by mouth daily., Disp: , Rfl:    celecoxib (CELEBREX) 100 MG capsule, Take 1 capsule (100 mg total) by mouth 2 (two) times daily., Disp: 180 capsule, Rfl: 3   cetirizine (ZYRTEC) 10 MG tablet, Take 10 mg by mouth daily as needed for allergies., Disp: , Rfl:    doxycycline (VIBRA-TABS) 100 MG tablet, Take 2 tablets as Jenna single dose ONCE per episode for prevention of lymes, Disp: 20 tablet, Rfl: 0   EPINEPHrine 0.3 mg/0.3 mL IJ SOAJ injection, Inject 0.3 mg into the muscle as needed for anaphylaxis., Disp: 1 each, Rfl: 3   methocarbamol (ROBAXIN) 500 MG tablet, Take 1 tablet (500 mg total) by mouth every 8 (eight) hours as needed for muscle spasms., Disp: 60 tablet, Rfl: 0   mupirocin ointment (BACTROBAN) 2 %, Apply to the affected area twice daily x5 days, Disp: 22 g, Rfl: 0   Omega-3 Fatty Acids (FISH OIL) 1000 MG CAPS, Take 2,000 mg by mouth in the morning, at noon, and at bedtime. Relief Factor, Disp: , Rfl:  triamcinolone ointment (KENALOG) 0.5 %, Apply 1 Application topically 2 (two) times daily., Disp: 30 g, Rfl: 0 Social History   Socioeconomic History   Marital status: Married    Spouse name: Brett Canales   Number of children: 1   Years of education: 13   Highest education level: Some college, no degree  Occupational History   Occupation: retired  Tobacco Use   Smoking status: Former    Current packs/day: 0.00    Average packs/day: 0.3 packs/day for 15.0 years (4.5 ttl pk-yrs)    Types: Cigarettes    Start date: 10/28/1969    Quit date: 10/28/1984    Years since quitting: 38.7   Smokeless  tobacco: Never   Tobacco comments:    smoked "off and on" x 15 yrs  Vaping Use   Vaping status: Never Used  Substance and Sexual Activity   Alcohol use: Not Currently    Comment: Holidays - occasional wine   Drug use: No   Sexual activity: Yes    Birth control/protection: Post-menopausal  Other Topics Concern   Not on file  Social History Narrative   Not on file   Social Determinants of Health   Financial Resource Strain: Low Risk  (03/06/2023)   Overall Financial Resource Strain (CARDIA)    Difficulty of Paying Living Expenses: Not hard at all  Food Insecurity: No Food Insecurity (03/06/2023)   Hunger Vital Sign    Worried About Running Out of Food in the Last Year: Never true    Ran Out of Food in the Last Year: Never true  Transportation Needs: Unknown (03/06/2023)   PRAPARE - Administrator, Civil Service (Medical): No    Lack of Transportation (Non-Medical): Not on file  Physical Activity: Unknown (03/06/2023)   Exercise Vital Sign    Days of Exercise per Week: 7 days    Minutes of Exercise per Session: Patient declined  Stress: No Stress Concern Present (03/06/2023)   [employer] of Occupational Health - Occupational Stress Questionnaire    Feeling of Stress : Not at all  Social Connections: Moderately Isolated (03/06/2023)   Social Connection and Isolation Panel [NHANES]    Frequency of Communication with Friends and Family: More than three times Jenna week    Frequency of Social Gatherings with Friends and Family: More than three times Jenna week    Attends Religious Services: Never    Database administrator or Organizations: No    Attends Engineer, structural: Not on file    Marital Status: Married  Catering manager Violence: Not At Risk (07/31/2021)   Humiliation, Afraid, Rape, and Kick questionnaire    Fear of Current or Ex-Partner: No    Emotionally Abused: No    Physically Abused: No    Sexually Abused: No   Family History  Problem Relation Age of  Onset   Aneurysm Mother        passed away at age 52   Cancer Father        passed away after Prostate cancer metastesize   Cancer Sister        she passed away 19 due to Breast Cancer   Stroke Sister    Aneurysm Sister    Colon cancer Neg Hx    Breast cancer Neg Hx     Objective: Office vital signs reviewed. BP 116/66   Pulse 83   Temp 98.8 F (37.1 C)   Ht 5\' 6"  (1.676 m)   Wt 175 lb (  79.4 kg)   SpO2 94%   BMI 28.25 kg/m   Physical Examination:  General: Awake, alert, well nourished, No acute distress HEENT: sclera white, MMM Cardio: regular rate and rhythm, S1S2 heard, no murmurs appreciated Pulm: clear to auscultation bilaterally, no wheezes, rhonchi or rales; normal work of breathing on room air MSK:  Left shoulder: Painful arc sign present.  No tenderness palpation to the shoulder.  She is able to ABduct shoulder but again this causes discomfort  Thoracic spine: Tenderness to palpation along the paraspinal area at approximately the T8 to T10 area on the right.  No palpable abnormalities.  Lumbar spine: 5/5 lower extremity strength.  Negative straight leg raise.  Normal tone.  No midline tenderness palpation or paraspinal muscle tenderness palpation present  Assessment/ Plan: 72 y.o. female   Polyarthralgia - Plan: celecoxib (CELEBREX) 100 MG capsule  Chronic right-sided thoracic back pain - Plan: Ambulatory referral to Orthopedic Surgery  Chronic left shoulder pain - Plan: Ambulatory referral to Orthopedic Surgery  Stop meloxicam.  Counseled extensively on not utilizing more than what is prescribed on medication.  I am going to switch her to Celebrex in efforts to treat her pain.  We discussed the cardiovascular risks associated with NSAID medications.  Use sparingly.  She does not want anything overly sedating.  I am referring her to orthopedics for further evaluation of left shoulder and right sided thoracic back pain.  Offered physical therapy but she declined  this because she is very physically active in the gym and does not feel like they can offer her much.   Raliegh Ip, DO [facility] [phone]

## 2023-07-31 DIAGNOSIS — M9902 Segmental and somatic dysfunction of thoracic region: Secondary | ICD-10-CM | POA: Diagnosis not present

## 2023-07-31 DIAGNOSIS — M62830 Muscle spasm of back: Secondary | ICD-10-CM | POA: Diagnosis not present

## 2023-07-31 DIAGNOSIS — M9901 Segmental and somatic dysfunction of cervical region: Secondary | ICD-10-CM | POA: Diagnosis not present

## 2023-07-31 DIAGNOSIS — M9903 Segmental and somatic dysfunction of lumbar region: Secondary | ICD-10-CM | POA: Diagnosis not present

## 2023-08-04 DIAGNOSIS — M9901 Segmental and somatic dysfunction of cervical region: Secondary | ICD-10-CM | POA: Diagnosis not present

## 2023-08-04 DIAGNOSIS — M9902 Segmental and somatic dysfunction of thoracic region: Secondary | ICD-10-CM | POA: Diagnosis not present

## 2023-08-04 DIAGNOSIS — M62830 Muscle spasm of back: Secondary | ICD-10-CM | POA: Diagnosis not present

## 2023-08-04 DIAGNOSIS — M25512 Pain in left shoulder: Secondary | ICD-10-CM | POA: Diagnosis not present

## 2023-08-04 DIAGNOSIS — M9903 Segmental and somatic dysfunction of lumbar region: Secondary | ICD-10-CM | POA: Diagnosis not present

## 2023-08-06 DIAGNOSIS — M62830 Muscle spasm of back: Secondary | ICD-10-CM | POA: Diagnosis not present

## 2023-08-06 DIAGNOSIS — M9903 Segmental and somatic dysfunction of lumbar region: Secondary | ICD-10-CM | POA: Diagnosis not present

## 2023-08-06 DIAGNOSIS — M9901 Segmental and somatic dysfunction of cervical region: Secondary | ICD-10-CM | POA: Diagnosis not present

## 2023-08-06 DIAGNOSIS — M9902 Segmental and somatic dysfunction of thoracic region: Secondary | ICD-10-CM | POA: Diagnosis not present

## 2023-08-11 ENCOUNTER — Ambulatory Visit: Payer: Medicare Other | Attending: Family Medicine

## 2023-08-11 DIAGNOSIS — M25512 Pain in left shoulder: Secondary | ICD-10-CM | POA: Diagnosis not present

## 2023-08-11 DIAGNOSIS — G8929 Other chronic pain: Secondary | ICD-10-CM | POA: Diagnosis not present

## 2023-08-11 DIAGNOSIS — M25612 Stiffness of left shoulder, not elsewhere classified: Secondary | ICD-10-CM | POA: Insufficient documentation

## 2023-08-11 NOTE — Therapy (Signed)
OUTPATIENT PHYSICAL THERAPY SHOULDER EVALUATION   Patient Name: Jenna Smith MRN: [id number] DOB:[birth year], 72 y.o., female Today's Date: 08/11/2023  END OF SESSION:  PT End of Session - 08/11/23 1302     Visit Number 1    Number of Visits 8    Date for PT Re-Evaluation 09/12/23    PT Start Time 1303    PT Stop Time 1334    PT Time Calculation (min) 31 min    Activity Tolerance Patient tolerated treatment well    Behavior During Therapy [facility] for tasks assessed/performed             Past Medical History:  Diagnosis Date   Allergy    multiple food and environmental allergies   Anemia 12/22/2017   Arthritis    Past Surgical History:  Procedure Laterality Date   BREAST SURGERY Bilateral 02/2000   Breast reduction   COLONOSCOPY WITH PROPOFOL N/A 05/29/2021   Surgeon: Earnest Bailey K, DO; Nonbleeding internal hemorrhoids, 10 mm polyp in the cecum.  Pathology revealed tubular adenoma.  Recommended repeat colonoscopy in 3 years.   COSMETIC SURGERY  06/2011   Face lift   ESOPHAGOGASTRODUODENOSCOPY (EGD) WITH PROPOFOL N/A 05/29/2021   Surgeon: Lanelle Bal, DO; Gastritis biopsied, normal esophagus and duodenum. Pathology with reactive gastropathy, negative for H. pylori.   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   Syncope 12/22/2017   Hypokalemia 12/22/2017   Anemia  12/22/2017   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 She notes that is constantly using her arm gardening and doing other activities. She was told that her pain due to arthritis and that she may eventually need a joint replacement. She notes that her pain is fairly steady since it first began.She has never injured this shoulder before.  Hand dominance: Right  PERTINENT HISTORY: History of a TBI, osteoarthritis, allergies, and vertigo  PAIN:  Are you having pain? Yes: NPRS scale: 8/10 Pain location: left shoulder  Pain description: intermittent ache Aggravating factors: gardening, moving her arm the "wrong way", and overuse Relieving factors: rest  PRECAUTIONS: None  RED FLAGS: None   WEIGHT BEARING RESTRICTIONS: No  FALLS:  Has patient fallen in last 6 months? No  LIVING ENVIRONMENT: Lives with: lives with their family Lives in: House/apartment Has following equipment at home: None  OCCUPATION: Retired  PLOF: Independent  PATIENT GOALS:be able to be outside longer working around her house (2-3 hours at most), reduced pain, and improved mobility  NEXT MD VISIT: none scheduled  OBJECTIVE:  Note: Objective measures were completed at Evaluation  unless otherwise noted.  PATIENT SURVEYS:  FOTO 61.40  COGNITION: Overall cognitive status: Within functional limits for tasks assessed     SENSATION: Patient reports no numbness or tingling  POSTURE: Forward head  UPPER EXTREMITY ROM:   Active ROM Right eval Left eval  Shoulder flexion 145 128  Shoulder extension    Shoulder abduction 144 93  Shoulder adduction    Shoulder internal rotation To T8  To left gluteal  Shoulder external rotation To T4 To T3   Elbow flexion    Elbow extension    Wrist flexion    Wrist extension    Wrist ulnar deviation    Wrist radial deviation    Wrist pronation    Wrist supination    (Blank rows = not tested)  UPPER EXTREMITY MMT:  MMT Right eval Left eval  Shoulder flexion 4/5 4/5  Shoulder extension    Shoulder abduction 4+/5 4/5; crepitus  Shoulder adduction    Shoulder internal rotation 4+/5 4/5  Shoulder external rotation 4+/5 4/5  Middle trapezius    Lower trapezius    Elbow flexion    Elbow extension    Wrist flexion    Wrist extension    Wrist ulnar deviation    Wrist radial deviation    Wrist pronation    Wrist supination    Grip strength (lbs)    (Blank rows = not tested)  JOINT MOBILITY TESTING:  No significant limitations noted  PALPATION:  TTP: left upper trapezius   TODAY'S TREATMENT:                                                                                                                                         DATE:  PATIENT EDUCATION: Education details: POC, healing, arthritis, healing, benefits of exercise, and goals for therapy Person educated: Patient Education method: Explanation Education comprehension: verbalized understanding  HOME EXERCISE PROGRAM:   ASSESSMENT:  CLINICAL IMPRESSION: Patient is a 72 y.o. female who was seen today for physical therapy evaluation and treatment for chronic left shoulder pain. She presented reporting intermittent levels of high pain severity  and low to moderate irritability. However, none of today's assessments significantly reproduced her familiar pain. She exhibited reduced left shoulder active range of motion compared to the right shoulder. Recommend that she continue with skilled physical therapy to address her impairments to maximize her functional mobility.  OBJECTIVE IMPAIRMENTS: decreased ROM, decreased strength, impaired tone, impaired UE functional use, postural dysfunction, and pain.   ACTIVITY LIMITATIONS: lifting and reach over head  PARTICIPATION LIMITATIONS: yard work  PERSONAL FACTORS: Time since onset of injury/illness/exacerbation and 3+ comorbidities: History of a TBI, osteoarthritis, allergies, and vertigo  are also affecting patient's functional outcome.   REHAB POTENTIAL: Good  CLINICAL DECISION MAKING: Stable/uncomplicated  EVALUATION COMPLEXITY: Low   GOALS: Goals reviewed with patient? Yes  LONG TERM GOALS: Target date: 09/08/23  Patient will be independent with her HEP.  Baseline:  Goal status: INITIAL  2.  Patient will improve her left shoulder flexion to at least 135 degrees for improved function reaching overhead.  Baseline:  Goal status: INITIAL  3.  Patient will improve her left shoulder abduction to at least 120 degrees for improved function with overhead activities.  Baseline:  Goal status: INITIAL  4.  Patient will be able to tuck her shirt with her left upper extremity without being limited by her familiar symptoms.  Baseline:  Goal status: INITIAL  5.  Patient will be able to complete her daily activities without her familiar pain exceeding 6/10. Baseline:  Goal status: INITIAL  PLAN:  PT FREQUENCY: 2x/week  PT DURATION: 4 weeks  PLANNED INTERVENTIONS: 97164- PT Re-evaluation, 97110-Therapeutic exercises, 97530- Therapeutic activity, 97112- Neuromuscular re-education, 97535- Self Care, 62952- Manual therapy, 97014- Electrical stimulation (unattended), 97016-  Vasopneumatic device, Patient/Family education, Joint mobilization, Cryotherapy, and Moist heat  PLAN FOR NEXT SESSION: UBE, wall slides, upper extremity strengthening, and modalities as needed   Granville Lewis, PT 08/11/2023, 4:26 PM

## 2023-08-15 ENCOUNTER — Ambulatory Visit: Payer: Medicare Other

## 2023-08-15 DIAGNOSIS — M25612 Stiffness of left shoulder, not elsewhere classified: Secondary | ICD-10-CM | POA: Diagnosis not present

## 2023-08-15 DIAGNOSIS — G8929 Other chronic pain: Secondary | ICD-10-CM | POA: Diagnosis not present

## 2023-08-15 DIAGNOSIS — M25512 Pain in left shoulder: Secondary | ICD-10-CM | POA: Diagnosis not present

## 2023-08-15 NOTE — Therapy (Signed)
OUTPATIENT PHYSICAL THERAPY SHOULDER TREATMENT   Patient Name: Jenna Smith MRN: [id number] DOB:1950-12-02, 72 y.o., female Today's Date: 08/15/2023  END OF SESSION:  PT End of Session - 08/15/23 0849     Visit Number 2    Number of Visits 8    Date for PT Re-Evaluation 09/12/23    PT Start Time 0845    PT Stop Time 0928    PT Time Calculation (min) 43 min    Activity Tolerance Patient tolerated treatment well    Behavior During Therapy [facility] for tasks assessed/performed              Past Medical History:  Diagnosis Date   Allergy    multiple food and environmental allergies   Anemia 12/22/2017   Arthritis    Past Surgical History:  Procedure Laterality Date   BREAST SURGERY Bilateral 02/2000   Breast reduction   COLONOSCOPY WITH PROPOFOL N/A 05/29/2021   Surgeon: Earnest Bailey K, DO; Nonbleeding internal hemorrhoids, 10 mm polyp in the cecum.  Pathology revealed tubular adenoma.  Recommended repeat colonoscopy in 3 years.   COSMETIC SURGERY  06/2011   Face lift   ESOPHAGOGASTRODUODENOSCOPY (EGD) WITH PROPOFOL N/A 05/29/2021   Surgeon: Lanelle Bal, DO; Gastritis biopsied, normal esophagus and duodenum. Pathology with reactive gastropathy, negative for H. pylori.   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   Syncope 12/22/2017   Hypokalemia 12/22/2017   Anemia  12/22/2017   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  Hand dominance: Right  PERTINENT HISTORY: History of a TBI, osteoarthritis, allergies, and vertigo  PAIN:  Are you having pain? Yes: NPRS scale: 8/10 Pain location: left shoulder  Pain description: intermittent ache Aggravating factors: gardening, moving her arm the "wrong way", and overuse Relieving factors: rest  PRECAUTIONS: None  RED FLAGS: None   WEIGHT BEARING RESTRICTIONS: No  FALLS:  Has patient fallen in last 6 months? No  LIVING ENVIRONMENT: Lives with: lives with their family Lives in: House/apartment Has following equipment at home: None  OCCUPATION: Retired  PLOF: Independent  PATIENT GOALS:be able to be outside longer working around her house (2-3 hours at most), reduced pain, and improved mobility  NEXT MD VISIT: none scheduled  OBJECTIVE:  Note: Objective measures were completed at Evaluation unless otherwise noted.  PATIENT SURVEYS:  FOTO 61.40  COGNITION: Overall cognitive status: Within functional limits for tasks assessed     SENSATION: Patient reports no numbness or tingling  POSTURE: Forward head  UPPER EXTREMITY ROM:   Active ROM Right eval  Left eval  Shoulder flexion 145 128  Shoulder extension    Shoulder abduction 144 93  Shoulder adduction    Shoulder internal rotation To T8  To left gluteal  Shoulder external rotation To T4 To T3   Elbow flexion    Elbow extension    Wrist flexion    Wrist extension    Wrist ulnar deviation    Wrist radial deviation    Wrist pronation    Wrist supination    (Blank rows = not tested)  UPPER EXTREMITY MMT:  MMT Right eval Left eval  Shoulder flexion 4/5 4/5  Shoulder extension    Shoulder abduction 4+/5 4/5; crepitus  Shoulder adduction    Shoulder internal rotation 4+/5 4/5  Shoulder external rotation 4+/5 4/5  Middle trapezius    Lower trapezius    Elbow flexion    Elbow extension    Wrist flexion    Wrist extension    Wrist ulnar deviation    Wrist radial deviation    Wrist pronation    Wrist supination    Grip strength (lbs)    (Blank rows = not tested)  JOINT MOBILITY TESTING:  No significant limitations noted  PALPATION:  TTP: left upper trapezius   TODAY'S TREATMENT:                                                                                                                                         DATE:                                    08/15/23 EXERCISE LOG  Exercise Repetitions and Resistance Comments  UBE 10 minutes @  90 RPM    Resisted row  Blue t-band x 2 minutes    Resisted pull down  Blue t-band x 30 reps    Functional IR stretch  4 x 30 seconds LLE only   L shoulder resisted ER  Blue t-band x 30 reps    L shoulder resisted IR  Blue t-band x 30 reps         Blank cell = exercise not performed today   PATIENT EDUCATION: Education details: POC, healing, arthritis, healing, benefits of exercise, and goals for therapy Person educated: Patient Education method: Explanation Education comprehension: verbalized understanding  HOME EXERCISE PROGRAM: [id number]  ASSESSMENT:  CLINICAL IMPRESSION: Patient was introduced to multiple new  interventions for improved rotator cuff strengthening. She required moderate cueing with today's interventions for a slow controlled pace to avoid altered movement patterns . She experienced no increase pain or discomfort with any of today's interventions. She was provided a home exercise program which she was able to properly demonstrate. She reported feeling comfortable with these interventions. She reported that her shoulder felt good upon the conclusion of treatment. She continues to require skilled physical therapy to address her remaining impairments to maximize her functional mobility.    OBJECTIVE IMPAIRMENTS: decreased ROM, decreased strength, impaired tone, impaired UE functional use, postural dysfunction, and pain.   ACTIVITY LIMITATIONS: lifting and reach over head  PARTICIPATION LIMITATIONS: yard work  PERSONAL FACTORS: Time since onset of injury/illness/exacerbation and 3+ comorbidities: History of a TBI, osteoarthritis, allergies, and vertigo  are also affecting patient's functional outcome.   REHAB POTENTIAL: Good  CLINICAL DECISION MAKING: Stable/uncomplicated  EVALUATION COMPLEXITY: Low   GOALS: Goals reviewed with patient? Yes  LONG TERM GOALS: Target date: 09/08/23  Patient will be independent with her HEP.  Baseline:  Goal status: INITIAL  2.  Patient will improve her left shoulder flexion to at least 135 degrees for improved function reaching overhead.  Baseline:  Goal status: INITIAL  3.  Patient will improve her left shoulder abduction to at least 120 degrees for improved function with overhead activities.  Baseline:  Goal status: INITIAL  4.  Patient will be able to tuck her shirt with her left upper extremity without being limited by her familiar symptoms.  Baseline:  Goal status: INITIAL  5.  Patient will be able to complete her daily activities without her familiar pain exceeding 6/10. Baseline:  Goal status: INITIAL  PLAN:  PT FREQUENCY:  2x/week  PT DURATION: 4 weeks  PLANNED INTERVENTIONS: 97164- PT Re-evaluation, 97110-Therapeutic exercises, 97530- Therapeutic activity, 97112- Neuromuscular re-education, 97535- Self Care, 08657- Manual therapy, 97014- Electrical stimulation (unattended), 97016- Vasopneumatic device, Patient/Family education, Joint mobilization, Cryotherapy, and Moist heat  PLAN FOR NEXT SESSION: UBE, wall slides, upper extremity strengthening, and modalities as needed   Granville Lewis, PT 08/15/2023, 1:22 PM

## 2023-08-18 ENCOUNTER — Ambulatory Visit: Payer: Medicare Other | Admitting: *Deleted

## 2023-08-18 DIAGNOSIS — M9902 Segmental and somatic dysfunction of thoracic region: Secondary | ICD-10-CM | POA: Diagnosis not present

## 2023-08-18 DIAGNOSIS — M62830 Muscle spasm of back: Secondary | ICD-10-CM | POA: Diagnosis not present

## 2023-08-18 DIAGNOSIS — M9903 Segmental and somatic dysfunction of lumbar region: Secondary | ICD-10-CM | POA: Diagnosis not present

## 2023-08-18 DIAGNOSIS — M9901 Segmental and somatic dysfunction of cervical region: Secondary | ICD-10-CM | POA: Diagnosis not present

## 2023-08-19 ENCOUNTER — Ambulatory Visit: Payer: Medicare Other | Admitting: Nurse Practitioner

## 2023-08-19 ENCOUNTER — Encounter: Payer: Self-pay | Admitting: Nurse Practitioner

## 2023-08-19 VITALS — BP 108/62 | HR 80 | Temp 96.9°F | Resp 20 | Ht 66.0 in | Wt 182.0 lb

## 2023-08-19 DIAGNOSIS — G8929 Other chronic pain: Secondary | ICD-10-CM | POA: Diagnosis not present

## 2023-08-19 DIAGNOSIS — M9902 Segmental and somatic dysfunction of thoracic region: Secondary | ICD-10-CM | POA: Diagnosis not present

## 2023-08-19 DIAGNOSIS — M5441 Lumbago with sciatica, right side: Secondary | ICD-10-CM | POA: Diagnosis not present

## 2023-08-19 DIAGNOSIS — M9903 Segmental and somatic dysfunction of lumbar region: Secondary | ICD-10-CM | POA: Diagnosis not present

## 2023-08-19 DIAGNOSIS — M9901 Segmental and somatic dysfunction of cervical region: Secondary | ICD-10-CM | POA: Diagnosis not present

## 2023-08-19 DIAGNOSIS — M62830 Muscle spasm of back: Secondary | ICD-10-CM | POA: Diagnosis not present

## 2023-08-19 MED ORDER — METHYLPREDNISOLONE ACETATE 80 MG/ML IJ SUSP
80.0000 mg | Freq: Once | INTRAMUSCULAR | Status: AC
Start: 2023-08-19 — End: 2023-08-19
  Administered 2023-08-19: 80 mg via INTRAMUSCULAR

## 2023-08-19 MED ORDER — KETOROLAC TROMETHAMINE 60 MG/2ML IM SOLN
60.0000 mg | Freq: Once | INTRAMUSCULAR | Status: AC
Start: 2023-08-19 — End: 2023-08-19
  Administered 2023-08-19: 60 mg via INTRAMUSCULAR

## 2023-08-19 NOTE — Progress Notes (Signed)
Subjective:    Patient ID: Jenna Smith, female    DOB: 1951-02-28, 72 y.o.   MRN: [id number]   Chief Complaint: back pain  Patient has had history of back pain for years. Has just gotten worse over the last week.  Back Pain This is a new problem. The current episode started more than 1 year ago. The problem occurs intermittently. The problem has been waxing and waning since onset. The pain is present in the lumbar spine. The quality of the pain is described as aching and shooting. The pain is at a severity of 10/10. The pain is severe. The pain is The same all the time. The symptoms are aggravated by bending, position, lying down, standing and twisting. Treatments tried: Land. The treatment provided mild relief.    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       Objective:   Physical Exam Vitals and nursing note reviewed.  Constitutional:      General: She is not in acute distress.    Appearance: Normal appearance. She is well-developed.  Neck:     Vascular: No carotid bruit or JVD.  Cardiovascular:     Rate and Rhythm: Normal rate and regular rhythm.     Heart sounds: Normal heart sounds.  Pulmonary:     Effort: Pulmonary effort is normal. No respiratory distress.     Breath  sounds: Normal breath sounds. No wheezing or rales.  Chest:     Chest wall: No tenderness.  Abdominal:     General: Bowel sounds are normal. There is no distension or abdominal bruit.     Palpations: Abdomen is soft. There is no hepatomegaly, splenomegaly, mass or pulsatile mass.     Tenderness: There is no abdominal tenderness.  Musculoskeletal:     Cervical back: Normal range of motion and neck supple.     Comments: Pain lower lumbar spine with any movement (-) SLR bil Motor strength and sensation distally intact  Lymphadenopathy:     Cervical: No cervical adenopathy.  Skin:    General: Skin is warm and dry.  Neurological:     Mental Status: She is alert and oriented to person, place, and time.     Deep Tendon Reflexes: Reflexes are normal and symmetric.  Psychiatric:        Behavior: Behavior normal.        Thought Content: Thought content normal.        Judgment: Judgment normal.     BP 108/62   Pulse 80   Temp (!) 96.9 F (36.1 C) (Temporal)   Resp 20   Ht 5\' 6"  (1.676 m)   Wt 182 lb (82.6 kg)   SpO2 97%   BMI 29.38 kg/m        Assessment & Plan:  Jenna Smith in today with chief complaint of Back Pain   1. Chronic right-sided low back pain with right-sided sciatica Moist heat Rest Keep appointment with ortho next week - ketorolac (TORADOL) injection 60 mg - methylPREDNISolone acetate (DEPO-MEDROL) injection 80 mg    The above assessment and management plan was discussed with the patient. The patient verbalized understanding of and has agreed to the management plan. Patient is aware to call the clinic if symptoms persist or worsen. Patient is aware when to return to the clinic for a follow-up visit. Patient educated on when it is appropriate to go to the emergency department.   Jenna Daphine Deutscher, FNP

## 2023-08-19 NOTE — Patient Instructions (Signed)
Chronic Back Pain Chronic back pain is back pain that lasts longer than 3 months. The pain may get worse at certain times (flare-ups). There are things you can do at home to manage your pain. Follow these instructions at home: Watch for any changes in your symptoms. Take these actions to help with your pain: Managing pain and stiffness     If told, put ice on the painful area. You may be told to use ice for 24-48 hours after a flare-up starts. Put ice in a plastic bag. Place a towel between your skin and the bag. Leave the ice on for 20 minutes, 2-3 times a day. If told, put heat on the painful area. Do this as often as told by your doctor. Use the heat source that your doctor recommends, such as a moist heat pack or a heating pad. Place a towel between your skin and the heat source. Leave the heat on for 20-30 minutes. If your skin turns bright red, take off the ice or heat right away to prevent skin damage. The risk of damage is higher if you cannot feel pain, heat, or cold. Soak in a warm bath. This can help with pain. Activity        Avoid bending and other activities that make the pain worse. When you stand: Keep your upper back and neck straight. Keep your shoulders pulled back. Avoid slouching. When you sit: Keep your back straight. Relax your shoulders. Do not round your shoulders or pull them backward. Do not sit or stand in one place for too long. Take short rest breaks during the day. Lying down or standing is often better than sitting. Resting can help relieve pain. When sitting or lying down for a long time, do some mild activity or stretching. This will help to prevent stiffness and pain. Get regular exercise. Ask your doctor what activities are safe for you. You may have to avoid lifting. Ask your provider how much you can safely lift. If you lift things: Bend your knees. Keep the weight close to your body. Avoid twisting. Medicines Take over-the-counter and  prescription medicines only as told by your doctor. You may need to take medicines for pain and swelling. These may be taken by mouth or put on the skin. You may also be given muscle relaxants. Ask your doctor if the medicine prescribed to you: Requires you to avoid driving or using machinery. Can cause trouble pooping (constipation). You may need to take these actions to prevent or treat trouble pooping: Drink enough fluid to keep your pee (urine) pale yellow. Take over-the-counter or prescription medicines. Eat foods that are high in fiber. These include beans, whole grains, and fresh fruits and vegetables. Limit foods that are high in fat and sugars. These include fried or sweet foods. General instructions  Sleep on a firm mattress. Try lying on your side with your knees slightly bent. If you lie on your back, put a pillow under your knees. Do not smoke or use any products that contain nicotine or tobacco. If you need help quitting, ask your doctor. Contact a doctor if: Your pain does not get better with rest or medicine. You have new pain. You have a fever. You lose weight quickly. You have trouble doing your normal activities. One or both of your legs or feet feel weak. One or both of your legs or feet lose feeling (have numbness). Get help right away if: You are not able to control when you pee   or poop. You have bad back pain and: You feel like you may vomit (nauseous). You vomit. You have pain in your chest or your belly (abdomen). You have shortness of breath. You faint. These symptoms may be an emergency. Get help right away. Call 911. Do not wait to see if the symptoms will go away. Do not drive yourself to the hospital. This information is not intended to replace advice given to you by your health care provider. Make sure you discuss any questions you have with your health care provider. Document Revised: 06/03/2022 Document Reviewed: 06/03/2022 Elsevier Patient Education   2024 [employer]  

## 2023-08-20 ENCOUNTER — Encounter: Payer: PRIVATE HEALTH INSURANCE | Admitting: Physical Therapy

## 2023-08-20 DIAGNOSIS — M9902 Segmental and somatic dysfunction of thoracic region: Secondary | ICD-10-CM | POA: Diagnosis not present

## 2023-08-20 DIAGNOSIS — M62830 Muscle spasm of back: Secondary | ICD-10-CM | POA: Diagnosis not present

## 2023-08-20 DIAGNOSIS — M9901 Segmental and somatic dysfunction of cervical region: Secondary | ICD-10-CM | POA: Diagnosis not present

## 2023-08-20 DIAGNOSIS — M9903 Segmental and somatic dysfunction of lumbar region: Secondary | ICD-10-CM | POA: Diagnosis not present

## 2023-08-25 DIAGNOSIS — M5459 Other low back pain: Secondary | ICD-10-CM | POA: Diagnosis not present

## 2023-08-26 DIAGNOSIS — M9902 Segmental and somatic dysfunction of thoracic region: Secondary | ICD-10-CM | POA: Diagnosis not present

## 2023-08-26 DIAGNOSIS — M62830 Muscle spasm of back: Secondary | ICD-10-CM | POA: Diagnosis not present

## 2023-08-26 DIAGNOSIS — M9901 Segmental and somatic dysfunction of cervical region: Secondary | ICD-10-CM | POA: Diagnosis not present

## 2023-08-26 DIAGNOSIS — M9903 Segmental and somatic dysfunction of lumbar region: Secondary | ICD-10-CM | POA: Diagnosis not present

## 2023-08-28 DIAGNOSIS — M9902 Segmental and somatic dysfunction of thoracic region: Secondary | ICD-10-CM | POA: Diagnosis not present

## 2023-08-28 DIAGNOSIS — M62830 Muscle spasm of back: Secondary | ICD-10-CM | POA: Diagnosis not present

## 2023-08-28 DIAGNOSIS — M9901 Segmental and somatic dysfunction of cervical region: Secondary | ICD-10-CM | POA: Diagnosis not present

## 2023-08-28 DIAGNOSIS — M9903 Segmental and somatic dysfunction of lumbar region: Secondary | ICD-10-CM | POA: Diagnosis not present

## 2023-09-01 DIAGNOSIS — M62830 Muscle spasm of back: Secondary | ICD-10-CM | POA: Diagnosis not present

## 2023-09-01 DIAGNOSIS — M9903 Segmental and somatic dysfunction of lumbar region: Secondary | ICD-10-CM | POA: Diagnosis not present

## 2023-09-01 DIAGNOSIS — M9901 Segmental and somatic dysfunction of cervical region: Secondary | ICD-10-CM | POA: Diagnosis not present

## 2023-09-01 DIAGNOSIS — M9902 Segmental and somatic dysfunction of thoracic region: Secondary | ICD-10-CM | POA: Diagnosis not present

## 2023-09-03 DIAGNOSIS — M9903 Segmental and somatic dysfunction of lumbar region: Secondary | ICD-10-CM | POA: Diagnosis not present

## 2023-09-03 DIAGNOSIS — M62830 Muscle spasm of back: Secondary | ICD-10-CM | POA: Diagnosis not present

## 2023-09-03 DIAGNOSIS — M9901 Segmental and somatic dysfunction of cervical region: Secondary | ICD-10-CM | POA: Diagnosis not present

## 2023-09-03 DIAGNOSIS — M9902 Segmental and somatic dysfunction of thoracic region: Secondary | ICD-10-CM | POA: Diagnosis not present

## 2023-09-08 DIAGNOSIS — M9902 Segmental and somatic dysfunction of thoracic region: Secondary | ICD-10-CM | POA: Diagnosis not present

## 2023-09-08 DIAGNOSIS — M9901 Segmental and somatic dysfunction of cervical region: Secondary | ICD-10-CM | POA: Diagnosis not present

## 2023-09-08 DIAGNOSIS — M9903 Segmental and somatic dysfunction of lumbar region: Secondary | ICD-10-CM | POA: Diagnosis not present

## 2023-09-08 DIAGNOSIS — M62830 Muscle spasm of back: Secondary | ICD-10-CM | POA: Diagnosis not present

## 2023-09-11 DIAGNOSIS — M62830 Muscle spasm of back: Secondary | ICD-10-CM | POA: Diagnosis not present

## 2023-09-11 DIAGNOSIS — M9902 Segmental and somatic dysfunction of thoracic region: Secondary | ICD-10-CM | POA: Diagnosis not present

## 2023-09-11 DIAGNOSIS — M9901 Segmental and somatic dysfunction of cervical region: Secondary | ICD-10-CM | POA: Diagnosis not present

## 2023-09-11 DIAGNOSIS — M9903 Segmental and somatic dysfunction of lumbar region: Secondary | ICD-10-CM | POA: Diagnosis not present

## 2023-09-13 DIAGNOSIS — M5459 Other low back pain: Secondary | ICD-10-CM | POA: Diagnosis not present

## 2023-09-15 DIAGNOSIS — M9902 Segmental and somatic dysfunction of thoracic region: Secondary | ICD-10-CM | POA: Diagnosis not present

## 2023-09-15 DIAGNOSIS — M9903 Segmental and somatic dysfunction of lumbar region: Secondary | ICD-10-CM | POA: Diagnosis not present

## 2023-09-15 DIAGNOSIS — M62830 Muscle spasm of back: Secondary | ICD-10-CM | POA: Diagnosis not present

## 2023-09-15 DIAGNOSIS — M9901 Segmental and somatic dysfunction of cervical region: Secondary | ICD-10-CM | POA: Diagnosis not present

## 2023-09-18 ENCOUNTER — Other Ambulatory Visit: Payer: Self-pay | Admitting: Family Medicine

## 2023-09-18 DIAGNOSIS — M9902 Segmental and somatic dysfunction of thoracic region: Secondary | ICD-10-CM | POA: Diagnosis not present

## 2023-09-18 DIAGNOSIS — M9903 Segmental and somatic dysfunction of lumbar region: Secondary | ICD-10-CM | POA: Diagnosis not present

## 2023-09-18 DIAGNOSIS — M545 Low back pain, unspecified: Secondary | ICD-10-CM

## 2023-09-18 DIAGNOSIS — M9901 Segmental and somatic dysfunction of cervical region: Secondary | ICD-10-CM | POA: Diagnosis not present

## 2023-09-18 DIAGNOSIS — M62830 Muscle spasm of back: Secondary | ICD-10-CM | POA: Diagnosis not present

## 2023-09-23 DIAGNOSIS — M48062 Spinal stenosis, lumbar region with neurogenic claudication: Secondary | ICD-10-CM | POA: Diagnosis not present

## 2023-09-23 DIAGNOSIS — M5416 Radiculopathy, lumbar region: Secondary | ICD-10-CM | POA: Diagnosis not present

## 2023-10-11 DIAGNOSIS — M5416 Radiculopathy, lumbar region: Secondary | ICD-10-CM | POA: Diagnosis not present

## 2023-10-16 ENCOUNTER — Other Ambulatory Visit: Payer: Self-pay | Admitting: Family Medicine

## 2023-10-16 DIAGNOSIS — Z1231 Encounter for screening mammogram for malignant neoplasm of breast: Secondary | ICD-10-CM

## 2023-10-17 ENCOUNTER — Ambulatory Visit
Admission: RE | Admit: 2023-10-17 | Discharge: 2023-10-17 | Disposition: A | Discharge status: Home / Self Care | Payer: Medicare Other | Source: Ambulatory Visit | Attending: Family Medicine | Admitting: Family Medicine

## 2023-10-17 DIAGNOSIS — Z1231 Encounter for screening mammogram for malignant neoplasm of breast: Secondary | ICD-10-CM

## 2023-11-07 ENCOUNTER — Encounter: Payer: Self-pay | Admitting: Family Medicine

## 2023-11-07 ENCOUNTER — Ambulatory Visit (INDEPENDENT_AMBULATORY_CARE_PROVIDER_SITE_OTHER): Payer: Medicare Other | Admitting: Family Medicine

## 2023-11-07 VITALS — BP 117/71 | HR 75 | Temp 98.6°F | Ht 66.0 in | Wt 178.0 lb

## 2023-11-07 DIAGNOSIS — G8929 Other chronic pain: Secondary | ICD-10-CM

## 2023-11-07 DIAGNOSIS — Z8249 Family history of ischemic heart disease and other diseases of the circulatory system: Secondary | ICD-10-CM

## 2023-11-07 DIAGNOSIS — R519 Headache, unspecified: Secondary | ICD-10-CM

## 2023-11-07 NOTE — Progress Notes (Signed)
 Subjective: CC: Chronic follow-up PCP: Jolinda Norene HERO, DO YEP:Jenna Smith is a 73 y.o. female presenting to clinic today for:  1.  Headache Patient reports that she has ongoing intermittent headache and it seems migratory.  There is a family history of cerebral aneurysm in both her mother and sister.  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  She denies any neurologic deficits or severe thunderclap type headaches but of course worries about this given 2 first-degree relatives that have suffered from cerebral aneurysms.  Would like to be further evaluated if appropriate   ROS: Per HPI  Allergies  Allergen Reactions   Bactrim [Sulfamethoxazole-Trimethoprim] Shortness Of Breath   Bee Venom Anaphylaxis   Indomethacin Swelling    Face swells up   Avocado Hives   Bacitracin    Latex Hives   Neosporin [Neomycin-Bacitracin Zn-Polymyx] Hives   Other     Numerous food allergies: Broccoli, buckwheat, cabbage, cantaloupe, cinnamon, corn, polyps, lumen, molds, mushroom, black pepper, green pepper, pineapple, sweet potato, spinach, tomato, whole-wheat, whole egg, turkey meat, cow milk, crab, lobster, salmon, shellfish mix, coconut, black walnut.  *Patient states she continues to eat essentially all of these items.   Penicillins Hives    Has patient had a PCN reaction causing immediate rash, facial/tongue/throat swelling, SOB or lightheadedness with hypotension: no Has patient had a PCN reaction causing severe rash involving mucus membranes or skin necrosis: no Has patient had a PCN reaction that required hospitalization: no Has patient had a PCN reaction occurring within the last 10 years: no If all of the above answers are NO, then may proceed with Cephalosporin use.    Polymyxin B      Pomegranate (Punica Granatum) Hives   Past Medical History:  Diagnosis Date   Allergy     multiple food and environmental allergies   Anemia 12/22/2017   Arthritis     Current Outpatient Medications:    Calcium  Carbonate-Vit D-Min (CALCIUM  1200) 1200-1000 MG-UNIT CHEW, Chew 2 tablets by mouth daily., Disp: , Rfl:    EPINEPHrine  0.3 mg/0.3 mL IJ SOAJ injection, Inject 0.3 mg into the muscle as needed for anaphylaxis., Disp: 1 each, Rfl: 3   mupirocin  ointment (BACTROBAN ) 2 %, Apply to the affected area twice daily x5 days, Disp: 22 g, Rfl: 0   Omega-3 Fatty Acids (FISH OIL) 1000 MG CAPS, Take 2,000 mg by mouth in the morning, at noon, and at bedtime. Relief Factor, Disp: , Rfl:    triamcinolone  ointment (KENALOG ) 0.5 %, Apply 1 Application topically 2 (two) times daily., Disp: 30 g, Rfl: 0   acetaminophen  (TYLENOL ) 650 MG CR tablet, Take 650 mg by mouth every 8 (eight) hours as needed for pain. (Patient not taking: Reported on 11/07/2023), Disp: , Rfl:  Social History   Socioeconomic History   Marital status: Married    Spouse name: Marcey   Number of children: 1   Years of education: 13   Highest education level: Some college, no degree  Occupational History   Occupation: retired  Tobacco Use   Smoking status: Former    Current packs/day: 0.00    Average packs/day: 0.3 packs/day for 15.0 years (4.5 ttl pk-yrs)    Types: Cigarettes    Start date: 10/28/1969    Quit date: 10/28/1984    Years since quitting:  39.0   Smokeless tobacco: Never   Tobacco comments:    smoked off and on x 15 yrs  Vaping Use   Vaping status: Never Used  Substance and Sexual Activity   Alcohol use: Not Currently    Comment: Holidays - occasional wine   Drug use: No   Sexual activity: Yes    Birth control/protection: Post-menopausal  Other Topics Concern   Not on file  Social History Narrative   Not on file   Social Drivers of Health   Financial Resource Strain: Low Risk  (10/31/2023)   Overall  Financial Resource Strain (CARDIA)    Difficulty of Paying Living Expenses: Not hard at all  Food Insecurity: No Food Insecurity (10/31/2023)   Hunger Vital Sign    Worried About Running Out of Food in the Last Year: Never true    Ran Out of Food in the Last Year: Never true  Transportation Needs: No Transportation Needs (10/31/2023)   PRAPARE - Administrator, Civil Service (Medical): No    Lack of Transportation (Non-Medical): No  Physical Activity: Sufficiently Active (10/31/2023)   Exercise Vital Sign    Days of Exercise per Week: 7 days    Minutes of Exercise per Session: 30 min  Stress: No Stress Concern Present (10/31/2023)   [employer] of Occupational Health - Occupational Stress Questionnaire    Feeling of Stress : Not at all  Social Connections: Moderately Isolated (10/31/2023)   Social Connection and Isolation Panel [NHANES]    Frequency of Communication with Friends and Family: More than three times a week    Frequency of Social Gatherings with Friends and Family: More than three times a week    Attends Religious Services: Never    Database Administrator or Organizations: No    Attends Engineer, Structural: Not on file    Marital Status: Married  Catering Manager Violence: Not At Risk (07/31/2021)   Humiliation, Afraid, Rape, and Kick questionnaire    Fear of Current or Ex-Partner: No    Emotionally Abused: No    Physically Abused: No    Sexually Abused: No   Family History  Problem Relation Age of Onset   Aneurysm Mother        passed away at age 70   Cancer Father        passed away after Prostate cancer metastesize   Cancer Sister        she passed away 22 due to Breast Cancer   Stroke Sister    Aneurysm Sister    Colon cancer Neg Hx    Breast cancer Neg Hx     Objective: Office vital signs reviewed. BP 117/71   Pulse 75   Temp 98.6 F (37 C)   Ht 5' 6 (1.676 m)   Wt 178 lb (80.7 kg)   SpO2 94%   BMI 28.73 kg/m   Physical  Examination:  General: Awake, alert, well nourished, No acute distress HEENT: Sclera white.  EOMI.  PERRLA.  Moist mucous membranes. Neuro: Cranial nerves II through XII grossly intact.  No focal neurologic deficits identified.  Normal gait and station.  Normal speech.  Assessment/ Plan: 73 y.o. female   Chronic nonintractable headache, unspecified headache type - Plan: MR ANGIO HEAD WO CONTRAST  Family history of cerebral aneurysm - Plan: MR ANGIO HEAD WO CONTRAST  Patient with 2 first degree relatives with cerebral aneurysms.  Her 2022 MRA showed no evidence of aneurysm.  However,  typical guideline is to screen yearly for 3 years then every 5 years if the first 3 screenings have no evidence of aneurysm and persons that have 2 or more first-degree relatives.  Therefore we will plan for MRA this year and then again repeat next year.  If the subsequent MRAs are negative, plan to expand this out to every 5-year testing.  On exam today she had total normal neurologic exam.  Further recommendations pending results   Norene CHRISTELLA Fielding, DO [facility] [phone]

## 2023-11-19 ENCOUNTER — Other Ambulatory Visit (HOSPITAL_COMMUNITY): Payer: PRIVATE HEALTH INSURANCE

## 2023-11-20 ENCOUNTER — Ambulatory Visit (HOSPITAL_COMMUNITY)
Admission: RE | Admit: 2023-11-20 | Discharge: 2023-11-20 | Disposition: A | Discharge status: Home / Self Care | Payer: Medicare Other | Source: Ambulatory Visit | Attending: Family Medicine | Admitting: Family Medicine

## 2023-11-20 DIAGNOSIS — R519 Headache, unspecified: Secondary | ICD-10-CM | POA: Insufficient documentation

## 2023-11-20 DIAGNOSIS — G8929 Other chronic pain: Secondary | ICD-10-CM | POA: Insufficient documentation

## 2023-11-20 DIAGNOSIS — I671 Cerebral aneurysm, nonruptured: Secondary | ICD-10-CM | POA: Diagnosis not present

## 2023-11-20 DIAGNOSIS — Z8249 Family history of ischemic heart disease and other diseases of the circulatory system: Secondary | ICD-10-CM | POA: Diagnosis not present

## 2023-11-22 DIAGNOSIS — M5416 Radiculopathy, lumbar region: Secondary | ICD-10-CM | POA: Diagnosis not present

## 2023-11-27 ENCOUNTER — Telehealth: Payer: Self-pay

## 2023-11-27 NOTE — Telephone Encounter (Signed)
Copied from CRM [id number]. Topic: Clinical - Lab/Test Results >> Nov 27, 2023  7:55 AM Carlatta H wrote: Reason for CRM: Please call patient with MRI results

## 2023-11-27 NOTE — Telephone Encounter (Signed)
Results have not been reviewed yet

## 2023-12-01 ENCOUNTER — Telehealth: Payer: Self-pay | Admitting: Family Medicine

## 2023-12-01 NOTE — Telephone Encounter (Signed)
 See result note.

## 2023-12-01 NOTE — Telephone Encounter (Signed)
Copied from CRM [id number]. Topic: Clinical - Lab/Test Results >> Dec 01, 2023  8:30 AM Prudencio Pair wrote: Reason for CRM: Patient states she needs Dr. Nadine Counts to contact her ASAP. States her results from MRA came back in regards to her brain & it's worrying her. Please give pt a callback. CB #: [id number].

## 2023-12-02 DIAGNOSIS — M9905 Segmental and somatic dysfunction of pelvic region: Secondary | ICD-10-CM | POA: Diagnosis not present

## 2023-12-02 DIAGNOSIS — M9904 Segmental and somatic dysfunction of sacral region: Secondary | ICD-10-CM | POA: Diagnosis not present

## 2023-12-02 DIAGNOSIS — M9903 Segmental and somatic dysfunction of lumbar region: Secondary | ICD-10-CM | POA: Diagnosis not present

## 2023-12-02 DIAGNOSIS — M62830 Muscle spasm of back: Secondary | ICD-10-CM | POA: Diagnosis not present

## 2023-12-08 DIAGNOSIS — M62830 Muscle spasm of back: Secondary | ICD-10-CM | POA: Diagnosis not present

## 2023-12-08 DIAGNOSIS — M9904 Segmental and somatic dysfunction of sacral region: Secondary | ICD-10-CM | POA: Diagnosis not present

## 2023-12-08 DIAGNOSIS — M9905 Segmental and somatic dysfunction of pelvic region: Secondary | ICD-10-CM | POA: Diagnosis not present

## 2023-12-08 DIAGNOSIS — M9903 Segmental and somatic dysfunction of lumbar region: Secondary | ICD-10-CM | POA: Diagnosis not present

## 2023-12-15 DIAGNOSIS — M9903 Segmental and somatic dysfunction of lumbar region: Secondary | ICD-10-CM | POA: Diagnosis not present

## 2023-12-15 DIAGNOSIS — M62830 Muscle spasm of back: Secondary | ICD-10-CM | POA: Diagnosis not present

## 2023-12-15 DIAGNOSIS — M9905 Segmental and somatic dysfunction of pelvic region: Secondary | ICD-10-CM | POA: Diagnosis not present

## 2023-12-15 DIAGNOSIS — M19012 Primary osteoarthritis, left shoulder: Secondary | ICD-10-CM | POA: Diagnosis not present

## 2023-12-15 DIAGNOSIS — M9904 Segmental and somatic dysfunction of sacral region: Secondary | ICD-10-CM | POA: Diagnosis not present

## 2023-12-16 DIAGNOSIS — M9905 Segmental and somatic dysfunction of pelvic region: Secondary | ICD-10-CM | POA: Diagnosis not present

## 2023-12-16 DIAGNOSIS — M62830 Muscle spasm of back: Secondary | ICD-10-CM | POA: Diagnosis not present

## 2023-12-16 DIAGNOSIS — M9903 Segmental and somatic dysfunction of lumbar region: Secondary | ICD-10-CM | POA: Diagnosis not present

## 2023-12-16 DIAGNOSIS — M9904 Segmental and somatic dysfunction of sacral region: Secondary | ICD-10-CM | POA: Diagnosis not present

## 2023-12-22 DIAGNOSIS — M9905 Segmental and somatic dysfunction of pelvic region: Secondary | ICD-10-CM | POA: Diagnosis not present

## 2023-12-22 DIAGNOSIS — M9903 Segmental and somatic dysfunction of lumbar region: Secondary | ICD-10-CM | POA: Diagnosis not present

## 2023-12-22 DIAGNOSIS — M9904 Segmental and somatic dysfunction of sacral region: Secondary | ICD-10-CM | POA: Diagnosis not present

## 2023-12-22 DIAGNOSIS — M62830 Muscle spasm of back: Secondary | ICD-10-CM | POA: Diagnosis not present

## 2023-12-23 ENCOUNTER — Ambulatory Visit: Payer: Medicare Other | Admitting: Family Medicine

## 2023-12-26 ENCOUNTER — Ambulatory Visit: Payer: PRIVATE HEALTH INSURANCE | Admitting: Family Medicine

## 2023-12-29 DIAGNOSIS — M62830 Muscle spasm of back: Secondary | ICD-10-CM | POA: Diagnosis not present

## 2023-12-29 DIAGNOSIS — M9903 Segmental and somatic dysfunction of lumbar region: Secondary | ICD-10-CM | POA: Diagnosis not present

## 2023-12-29 DIAGNOSIS — M9905 Segmental and somatic dysfunction of pelvic region: Secondary | ICD-10-CM | POA: Diagnosis not present

## 2023-12-29 DIAGNOSIS — M9904 Segmental and somatic dysfunction of sacral region: Secondary | ICD-10-CM | POA: Diagnosis not present

## 2023-12-30 ENCOUNTER — Encounter: Payer: Self-pay | Admitting: Family Medicine

## 2024-01-02 ENCOUNTER — Ambulatory Visit: Payer: PRIVATE HEALTH INSURANCE | Admitting: Family Medicine

## 2024-01-05 DIAGNOSIS — M9903 Segmental and somatic dysfunction of lumbar region: Secondary | ICD-10-CM | POA: Diagnosis not present

## 2024-01-05 DIAGNOSIS — M9905 Segmental and somatic dysfunction of pelvic region: Secondary | ICD-10-CM | POA: Diagnosis not present

## 2024-01-05 DIAGNOSIS — M9904 Segmental and somatic dysfunction of sacral region: Secondary | ICD-10-CM | POA: Diagnosis not present

## 2024-01-05 DIAGNOSIS — M62830 Muscle spasm of back: Secondary | ICD-10-CM | POA: Diagnosis not present

## 2024-01-06 DIAGNOSIS — M25562 Pain in left knee: Secondary | ICD-10-CM | POA: Diagnosis not present

## 2024-01-12 DIAGNOSIS — M9905 Segmental and somatic dysfunction of pelvic region: Secondary | ICD-10-CM | POA: Diagnosis not present

## 2024-01-12 DIAGNOSIS — M62830 Muscle spasm of back: Secondary | ICD-10-CM | POA: Diagnosis not present

## 2024-01-12 DIAGNOSIS — M9904 Segmental and somatic dysfunction of sacral region: Secondary | ICD-10-CM | POA: Diagnosis not present

## 2024-01-12 DIAGNOSIS — M9903 Segmental and somatic dysfunction of lumbar region: Secondary | ICD-10-CM | POA: Diagnosis not present

## 2024-01-14 ENCOUNTER — Ambulatory Visit: Payer: PRIVATE HEALTH INSURANCE | Admitting: Family Medicine

## 2024-02-03 ENCOUNTER — Encounter: Payer: Self-pay | Admitting: Family Medicine

## 2024-02-03 ENCOUNTER — Ambulatory Visit (INDEPENDENT_AMBULATORY_CARE_PROVIDER_SITE_OTHER): Admitting: Family Medicine

## 2024-02-03 VITALS — BP 139/81 | HR 72 | Temp 98.7°F | Ht 66.0 in | Wt 182.4 lb

## 2024-02-03 DIAGNOSIS — G8929 Other chronic pain: Secondary | ICD-10-CM

## 2024-02-03 DIAGNOSIS — G500 Trigeminal neuralgia: Secondary | ICD-10-CM | POA: Diagnosis not present

## 2024-02-03 DIAGNOSIS — G479 Sleep disorder, unspecified: Secondary | ICD-10-CM

## 2024-02-03 DIAGNOSIS — R519 Headache, unspecified: Secondary | ICD-10-CM | POA: Diagnosis not present

## 2024-02-03 DIAGNOSIS — H43811 Vitreous degeneration, right eye: Secondary | ICD-10-CM | POA: Diagnosis not present

## 2024-02-03 DIAGNOSIS — H2513 Age-related nuclear cataract, bilateral: Secondary | ICD-10-CM | POA: Diagnosis not present

## 2024-02-03 DIAGNOSIS — R42 Dizziness and giddiness: Secondary | ICD-10-CM

## 2024-02-03 DIAGNOSIS — Q141 Congenital malformation of retina: Secondary | ICD-10-CM | POA: Diagnosis not present

## 2024-02-03 MED ORDER — AMITRIPTYLINE HCL 10 MG PO TABS: 10.0000 mg | ORAL_TABLET | Freq: Every day | ORAL | 0 refills | Status: DC

## 2024-02-03 NOTE — Progress Notes (Signed)
 Subjective: CC: Dizziness PCP: Jenna Smith IEP:PIRJJOACZY [name] is a 73 y.o. female presenting to clinic today for:  1.  Dizziness Patient reports a 1 week history of positional dizziness.  This seems to be most prominent when she is waking up from sleep and getting out of the bed.  She often has to take a minute and sit back down before getting up.  She reports no overt spinning of the room, no presyncopal episodes.  She just simply feels off balance for a few minutes.  She drinks several large cups of water with electrolytes daily and wears compression hose.  She stays very physically active and does a lot of yard work.  She reports no falls.  She reports no unilateral weakness, sensory changes.  She does suffer from headaches and this has been a chronic issue for her.  She sleeps fairly but often not many hours per night.  She denies any bleeding.  She has been using as needed diclofenac for joint issues but no other medications.  She does have a history of cataracts and will be having these removed but denies any visual disturbance otherwise   ROS: Per HPI  Allergies  Allergen Reactions   Bactrim [Sulfamethoxazole-Trimethoprim] Shortness Of Breath   Bee Venom Anaphylaxis   Indomethacin Swelling    Face swells up   Avocado Hives   Bacitracin    Latex Hives   Neosporin [Neomycin-Bacitracin Zn-Polymyx] Hives   Other     Numerous food allergies: Broccoli, buckwheat, cabbage, cantaloupe, cinnamon, corn, polyps, lumen, molds, mushroom, black pepper, green pepper, pineapple, sweet potato, spinach, tomato, whole-wheat, whole egg, [place] meat, cow milk, crab, lobster, salmon, shellfish mix, coconut, black walnut.  *Patient states she continues to eat essentially all of these items.   Penicillins Hives    Has patient had a PCN reaction causing immediate rash, facial/tongue/throat swelling, SOB or lightheadedness with hypotension: no Has patient had a PCN reaction causing  severe rash involving mucus membranes or skin necrosis: no Has patient had a PCN reaction that required hospitalization: no Has patient had a PCN reaction occurring within the last 10 years: no If all of the above answers are "NO", then may proceed with Cephalosporin use.    Polymyxin B    Pomegranate (Punica Granatum) Hives   Past Medical History:  Diagnosis Date   Allergy    multiple food and environmental allergies   Anemia 12/22/2017   Arthritis     Current Outpatient Medications:    acetaminophen (TYLENOL) 650 MG CR tablet, Take 650 mg by mouth every 8 (eight) hours as needed for pain., Disp: , Rfl:    amitriptyline (ELAVIL) 10 MG tablet, Take 1-2 tablets (10-20 mg total) by mouth at bedtime. For sleep, headache, facial pain, chronic pain, Disp: 180 tablet, Rfl: 0   Calcium Carbonate-Vit D-Min (CALCIUM 1200) 1200-1000 MG-UNIT CHEW, Chew 2 tablets by mouth daily., Disp: , Rfl:    EPINEPHrine 0.3 mg/0.3 mL IJ SOAJ injection, Inject 0.3 mg into the muscle as needed for anaphylaxis., Disp: 1 each, Rfl: 3   mupirocin ointment (BACTROBAN) 2 %, Apply to the affected area twice daily x5 days, Disp: 22 g, Rfl: 0   Omega-3 Fatty Acids (FISH OIL) 1000 MG CAPS, Take 2,000 mg by mouth in the morning, at noon, and at bedtime. Relief Factor, Disp: , Rfl:    triamcinolone ointment (KENALOG) 0.5 %, Apply 1 Application topically 2 (two) times daily., Disp: 30 g, Rfl: 0 Social History  Socioeconomic History   Marital status: Married    Spouse name: Brett Canales   Number of children: 1   Years of education: 13   Highest education level: Some college, no degree  Occupational History   Occupation: retired  Tobacco Use   Smoking status: Former    Current packs/day: 0.00    Average packs/day: 0.3 packs/day for 15.0 years (4.5 ttl pk-yrs)    Types: Cigarettes    Start date: 10/28/1969    Quit date: 10/28/1984    Years since quitting: 39.2   Smokeless tobacco: Never   Tobacco comments:    smoked "off  and on" x 15 yrs  Vaping Use   Vaping status: Never Used  Substance and Sexual Activity   Alcohol use: Not Currently    Comment: Holidays - occasional wine   Drug use: No   Sexual activity: Yes    Birth control/protection: Post-menopausal  Other Topics Concern   Not on file  Social History Narrative   Not on file   Social Drivers of Health   Financial Resource Strain: Low Risk  (10/31/2023)   Overall Financial Resource Strain (CARDIA)    Difficulty of Paying Living Expenses: Not hard at all  Food Insecurity: No Food Insecurity (10/31/2023)   Hunger Vital Sign    Worried About Running Out of Food in the Last Year: Never true    Ran Out of Food in the Last Year: Never true  Transportation Needs: No Transportation Needs (10/31/2023)   PRAPARE - Administrator, Civil Service (Medical): No    Lack of Transportation (Non-Medical): No  Physical Activity: Sufficiently Active (10/31/2023)   Exercise Vital Sign    Days of Exercise per Week: 7 days    Minutes of Exercise per Session: 30 min  Stress: No Stress Concern Present (10/31/2023)   [employer] of Occupational Health - Occupational Stress Questionnaire    Feeling of Stress : Not at all  Social Connections: Moderately Isolated (10/31/2023)   Social Connection and Isolation Panel [NHANES]    Frequency of Communication with Friends and Family: More than three times a week    Frequency of Social Gatherings with Friends and Family: More than three times a week    Attends Religious Services: Never    Database administrator or Organizations: No    Attends Engineer, structural: Not on file    Marital Status: Married  Catering manager Violence: Not At Risk (07/31/2021)   Humiliation, Afraid, Rape, and Kick questionnaire    Fear of Current or Ex-Partner: No    Emotionally Abused: No    Physically Abused: No    Sexually Abused: No   Family History  Problem Relation Age of Onset   Aneurysm Mother        passed away at  age 31   Cancer Father        passed away after Prostate cancer metastesize   Cancer Sister        she passed away 24 due to Breast Cancer   Stroke Sister    Aneurysm Sister    Colon cancer Neg Hx    Breast cancer Neg Hx     Objective: Office vital signs reviewed. BP 139/81   Pulse 72   Temp 98.7 F (37.1 C)   Ht 5\' 6"  (1.676 m)   Wt 182 lb 6.4 oz (82.7 kg)   SpO2 96%   BMI 29.44 kg/m   Physical Examination:  General: Awake, alert,  well nourished, No acute distress HEENT: Sclera white.  Moist mucous membranes Cardio: regular rate and rhythm, S1S2 heard, no murmurs appreciated Pulm: clear to auscultation bilaterally, no wheezes, rhonchi or rales; normal work of breathing on room air MSK: Normal gait and station.  Ambulating independently Neuro: 5/5 UE and LE Strength and light touch sensation grossly intact, Crown nerves II through XII grossly intact.  Normal upper extremity cerebellar testing.  Negative Trendelenburg.  Orthostatic Vitals for the past 48 hrs (Last 6 readings):  Patient Position Orthostatic BP Orthostatic Pulse BP Location Cuff Size  02/03/24 1553 Supine 139/81 72 Left Arm Normal  02/03/24 1619 Sitting 147/81 73 Left Arm Normal  02/03/24 1620 Standing (!) 129/91 82 Left Arm Normal     Assessment/ Plan: 73 y.o. female   Orthostatic dizziness  Chronic nonintractable headache, unspecified headache type - Plan: amitriptyline (ELAVIL) 10 MG tablet  Trigeminal neuralgia of left side of face - Plan: amitriptyline (ELAVIL) 10 MG tablet  Sleep difficulties - Plan: amitriptyline (ELAVIL) 10 MG tablet  Orthostatic vital signs positive.  I suspect that this is what is leading to her intermittent dizziness with positional changes.  She is using compression hose and hydrating well.  Really at this point what she can Smith is just change positions slowly.  I have advised her to try and take things a little easier.  She is very active and still try to live her younger  lifestyle.  With regards to her headache, what seems to be trigeminal neuralgia Jenna trial her on low-dose amitriptyline.  Hopefully this will help a little bit with sleep as well.  Caution sedation.  I would like to see her back in a couple of months just to see how this is working for her   Jenna Smith [facility] [phone]

## 2024-02-03 NOTE — Patient Instructions (Addendum)
 Start with 1 tablet at bedtime.  After 1 week you may increase to 2 tablets at bedtime if needed. This should help with sleep, headache and facial pain.  It can also treat chronic pain issues as well.  It is not addictive.  If for any reason you do not like the medication wish to stop, please contact me   Orthostatic Hypotension Blood pressure is a measurement of how strongly, or weakly, your circulating blood is pressing against the walls of your arteries. Orthostatic hypotension is a drop in blood pressure that can happen when you change positions, such as when you go from lying down to standing. Arteries are blood vessels that carry blood from your heart throughout your body. When blood pressure is too low, you may not get enough blood to your brain or to the rest of your organs. Orthostatic hypotension can cause light-headedness, sweating, rapid heartbeat, blurred vision, and fainting. These symptoms require further investigation into the cause. What are the causes? Orthostatic hypotension can be caused by many things, including: Sudden changes in posture, such as standing up quickly after you have been sitting or lying down. Loss of blood (anemia) or loss of body fluids (dehydration). Heart problems, neurologic problems, or hormone problems. Pregnancy. Aging. The risk for this condition increases as you get older. Severe infection (sepsis). Certain medicines, such as medicines for high blood pressure or medicines that make the body lose excess fluids (diuretics). What are the signs or symptoms? Symptoms of this condition may include: Weakness, light-headedness, or dizziness. Sweating. Blurred vision. Tiredness (fatigue). Rapid heartbeat. Fainting, in severe cases. How is this diagnosed? This condition is diagnosed based on: Your symptoms and medical history. Your blood pressure measurements. Your health care provider will check your blood pressure when you are: Lying  down. Sitting. Standing. A blood pressure reading is recorded as two numbers, such as "120 over 80" (or 120/80). The first ("top") number is called the systolic pressure. It is a measure of the pressure in your arteries as your heart beats. The second ("bottom") number is called the diastolic pressure. It is a measure of the pressure in your arteries when your heart relaxes between beats. Blood pressure is measured in a unit called mmHg. Healthy blood pressure for most adults is 120/80 mmHg. Orthostatic hypotension is defined as a 20 mmHg drop in systolic pressure or a 10 mmHg drop in diastolic pressure within 3 minutes of standing. Other information or tests that may be used to diagnose orthostatic hypotension include: Your other vital signs, such as your heart rate and temperature. Blood tests. An electrocardiogram (ECG) or echocardiogram. A Holter monitor. This is a device you wear that records your heart rhythm continuously, usually for 24-48 hours. Tilt table test. For this test, you will be safely secured to a table that moves you from a lying position to an upright position. Your heart rhythm and blood pressure will be monitored during the test. How is this treated? This condition may be treated by: Changing your diet. This may involve eating more salt (sodium) or drinking more water. Changing the dosage of certain medicines you are taking that might be lowering your blood pressure. Correcting the underlying reason for the orthostatic hypotension. Wearing compression stockings. Taking medicines to raise your blood pressure. Avoiding actions that trigger symptoms. Follow these instructions at home: Medicines Take over-the-counter and prescription medicines only as told by your health care provider. Follow instructions from your health care provider about changing the dosage of your current  medicines, if this applies. Do not stop or adjust any of your medicines on your own. Eating and  drinking  Drink enough fluid to keep your urine pale yellow. Eat extra salt only as directed. Do not add extra salt to your diet unless advised by your health care provider. Eat frequent, small meals. Avoid standing up suddenly after eating. General instructions  Get up slowly from lying down or sitting positions. This gives your blood pressure a chance to adjust. Avoid hot showers and excessive heat as directed by your health care provider. Engage in regular physical activity as directed by your health care provider. If you have compression stockings, wear them as told. Keep all follow-up visits. This is important. Contact a health care provider if: You have a fever for more than 2-3 days. You feel more thirsty than usual. You feel dizzy or weak. Get help right away if: You have chest pain. You have a fast or irregular heartbeat. You become sweaty or feel light-headed. You feel short of breath. You faint. You have any symptoms of a stroke. "BE FAST" is an easy way to remember the main warning signs of a stroke: B - Balance. Signs are dizziness, sudden trouble walking, or loss of balance. E - Eyes. Signs are trouble seeing or a sudden change in vision. F - Face. Signs are sudden weakness or numbness of the face, or the face or eyelid drooping on one side. A - Arms. Signs are weakness or numbness in an arm. This happens suddenly and usually on one side of the body. S - Speech. Signs are sudden trouble speaking, slurred speech, or trouble understanding what people say. T - Time. Time to call emergency services. Write down what time symptoms started. You have other signs of a stroke, such as: A sudden, severe headache with no known cause. Nausea or vomiting. Seizure. These symptoms may represent a serious problem that is an emergency. Do not wait to see if the symptoms will go away. Get medical help right away. Call your local emergency services (911 in the [place] Do not drive yourself  to the hospital. Summary Orthostatic hypotension is a sudden drop in blood pressure. It can cause light-headedness, sweating, rapid heartbeat, blurred vision, and fainting. Orthostatic hypotension can be diagnosed by having your blood pressure taken while lying down, sitting, and then standing. Treatment may involve changing your diet, wearing compression stockings, sitting up slowly, adjusting your medicines, or correcting the underlying reason for the orthostatic hypotension. Get help right away if you have chest pain, a fast or irregular heartbeat, or symptoms of a stroke. This information is not intended to replace advice given to you by your health care provider. Make sure you discuss any questions you have with your health care provider. Document Revised: 12/28/2020 Document Reviewed: 12/28/2020 Elsevier Patient Education  2024 [employer].

## 2024-02-23 ENCOUNTER — Ambulatory Visit: Payer: PRIVATE HEALTH INSURANCE | Admitting: Family Medicine

## 2024-02-28 ENCOUNTER — Other Ambulatory Visit: Payer: Self-pay | Admitting: Family Medicine

## 2024-02-28 DIAGNOSIS — J3489 Other specified disorders of nose and nasal sinuses: Secondary | ICD-10-CM

## 2024-03-01 MED ORDER — MUPIROCIN 2 % EX OINT: TOPICAL_OINTMENT | CUTANEOUS | 0 refills | Status: DC

## 2024-03-04 DIAGNOSIS — H01004 Unspecified blepharitis left upper eyelid: Secondary | ICD-10-CM | POA: Diagnosis not present

## 2024-03-04 DIAGNOSIS — H01001 Unspecified blepharitis right upper eyelid: Secondary | ICD-10-CM | POA: Diagnosis not present

## 2024-03-04 DIAGNOSIS — H2513 Age-related nuclear cataract, bilateral: Secondary | ICD-10-CM | POA: Diagnosis not present

## 2024-03-29 DIAGNOSIS — H2511 Age-related nuclear cataract, right eye: Secondary | ICD-10-CM | POA: Diagnosis not present

## 2024-03-31 ENCOUNTER — Encounter (HOSPITAL_COMMUNITY)
Admission: RE | Admit: 2024-03-31 | Discharge: 2024-03-31 | Disposition: A | Discharge status: Home / Self Care | Payer: PRIVATE HEALTH INSURANCE | Source: Ambulatory Visit | Attending: Ophthalmology | Admitting: Ophthalmology

## 2024-03-31 NOTE — H&P (Signed)
 Surgical History & Physical  Patient Name: Jenna Smith  DOB: 13-Oct-1951  Surgery: Cataract extraction with intraocular lens implant phacoemulsification; Right Eye Surgeon: Tarri Farm MD Surgery Date: 04/05/2024 Pre-Op Date: 03/04/2024  HPI: A 59 Yr. old female patient 1. The patient is here for a Cataract Evaluation referred by Dr. Lincoln Renshaw. The patient complains of difficulty when reading fine print, books, newspaper, instructions etc., which began 4 months ago. Both eyes are affected. Right is worse than left. The episode is constant. The patient describes foggy symptoms affecting their eyes/vision. This is negatively affecting the patient's quality of life and the patient is unable to function adequately in life with the current level of vision. HPI Completed by Dr. Tarri Farm  Medical History: Cataracts  Allergies Arthritis  Review of Systems Allergic/Immunologic Seasonal Allergies Musculoskeletal arthritis All recorded systems are negative except as noted above.  Social Never smoked   Medication Mupirocin , Amitriptyline , Diclofenac  sodium  Sx/Procedures Breast Reduction, Knee Replacement  Drug Allergies  Pencillins, Latex gloves, Penicillin, Latex  History & Physical: Heent: cataracts NECK: supple without bruits LUNGS: lungs clear to auscultation CV: regular rate and rhythm Abdomen: soft and non-tender  Impression & Plan: Assessment: 1.  NUCLEAR SCLEROSIS AGE RELATED; Both Eyes (H25.13) 2.  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  ASTIGMATISM, REGULAR; Both Eyes (H52.223)  Plan: 1.  Cataract accounts for the patient's decreased vision. This visual impairment is not correctable with a tolerable change in glasses or contact lenses. Cataract surgery with an implantation of a new lens should significantly improve the visual and functional status of the patient. Discussed all risks, benefits,  alternatives, and potential complications. Discussed the procedures and recovery. Patient desires to have surgery. A-scan ordered and performed today for intra-ocular lens calculations. The surgery will be performed in order to improve vision for driving, reading, and for eye examinations. Recommend phacoemulsification with intra-ocular lens. Recommend Dextenza  for post-operative pain and inflammation. Dilates well - shugarcaine by protocol. Right Eye worse- Perform OD sx first, then OS. Recommend Toric Lens.  2.  Blepharitis is present - recommend regular lid cleaning.  3.  Diagnostic. Proceeding with cataract sx.  4.  Toric lens recommended OU.

## 2024-04-05 ENCOUNTER — Encounter (HOSPITAL_COMMUNITY)
Admission: RE | Disposition: A | Discharge status: Home / Self Care | Payer: Self-pay | Source: Home / Self Care | Attending: Ophthalmology

## 2024-04-05 ENCOUNTER — Ambulatory Visit (HOSPITAL_COMMUNITY): Admitting: Anesthesiology

## 2024-04-05 ENCOUNTER — Ambulatory Visit (HOSPITAL_COMMUNITY)
Admission: RE | Admit: 2024-04-05 | Discharge: 2024-04-05 | Disposition: A | Discharge status: Home / Self Care | Attending: Ophthalmology | Admitting: Ophthalmology

## 2024-04-05 DIAGNOSIS — I679 Cerebrovascular disease, unspecified: Secondary | ICD-10-CM

## 2024-04-05 DIAGNOSIS — H5203 Hypermetropia, bilateral: Secondary | ICD-10-CM | POA: Insufficient documentation

## 2024-04-05 DIAGNOSIS — H01004 Unspecified blepharitis left upper eyelid: Secondary | ICD-10-CM | POA: Insufficient documentation

## 2024-04-05 DIAGNOSIS — H5711 Ocular pain, right eye: Secondary | ICD-10-CM | POA: Diagnosis not present

## 2024-04-05 DIAGNOSIS — D649 Anemia, unspecified: Secondary | ICD-10-CM | POA: Diagnosis not present

## 2024-04-05 DIAGNOSIS — H2511 Age-related nuclear cataract, right eye: Secondary | ICD-10-CM

## 2024-04-05 DIAGNOSIS — Z8673 Personal history of transient ischemic attack (TIA), and cerebral infarction without residual deficits: Secondary | ICD-10-CM | POA: Diagnosis not present

## 2024-04-05 DIAGNOSIS — H52201 Unspecified astigmatism, right eye: Secondary | ICD-10-CM | POA: Insufficient documentation

## 2024-04-05 DIAGNOSIS — H01001 Unspecified blepharitis right upper eyelid: Secondary | ICD-10-CM | POA: Diagnosis not present

## 2024-04-05 DIAGNOSIS — Z87891 Personal history of nicotine dependence: Secondary | ICD-10-CM | POA: Diagnosis not present

## 2024-04-05 HISTORY — PX: INSERTION, STENT, DRUG-ELUTING, LACRIMAL CANALICULUS: SHX7453

## 2024-04-05 HISTORY — PX: CATARACT EXTRACTION W/PHACO: SHX586

## 2024-04-05 SURGERY — PHACOEMULSIFICATION, CATARACT, WITH IOL INSERTION
Anesthesia: Monitor Anesthesia Care | Site: Eye | Laterality: Right

## 2024-04-05 MED ORDER — POVIDONE-IODINE 5 % OP SOLN
OPHTHALMIC | Status: DC | PRN
  Administered 2024-04-05: 1 via OPHTHALMIC

## 2024-04-05 MED ORDER — STERILE WATER FOR IRRIGATION IR SOLN
Status: DC | PRN
  Administered 2024-04-05: 25 mL

## 2024-04-05 MED ORDER — TETRACAINE HCL 0.5 % OP SOLN
1.0000 [drp] | OPHTHALMIC | Status: AC | PRN
  Administered 2024-04-05 (×3): 1 [drp] via OPHTHALMIC

## 2024-04-05 MED ORDER — SODIUM HYALURONATE 10 MG/ML IO SOLUTION
PREFILLED_SYRINGE | INTRAOCULAR | Status: DC | PRN
  Administered 2024-04-05: .85 mL via INTRAOCULAR

## 2024-04-05 MED ORDER — PHENYLEPHRINE HCL 2.5 % OP SOLN
1.0000 [drp] | OPHTHALMIC | Status: AC | PRN
  Administered 2024-04-05 (×3): 1 [drp] via OPHTHALMIC

## 2024-04-05 MED ORDER — DEXAMETHASONE 0.4 MG OP INST
VAGINAL_INSERT | OPHTHALMIC | Status: AC
  Filled 2024-04-05: qty 1

## 2024-04-05 MED ORDER — SODIUM HYALURONATE 23MG/ML IO SOSY
PREFILLED_SYRINGE | INTRAOCULAR | Status: DC | PRN
  Administered 2024-04-05: .6 mL via INTRAOCULAR

## 2024-04-05 MED ORDER — TROPICAMIDE 1 % OP SOLN
1.0000 [drp] | OPHTHALMIC | Status: AC | PRN
  Administered 2024-04-05 (×3): 1 [drp] via OPHTHALMIC

## 2024-04-05 MED ORDER — DEXAMETHASONE 0.4 MG OP INST
VAGINAL_INSERT | OPHTHALMIC | Status: DC | PRN
  Administered 2024-04-05: .4 mg via OPHTHALMIC

## 2024-04-05 MED ORDER — LIDOCAINE HCL 3.5 % OP GEL
1.0000 | Freq: Once | OPHTHALMIC | Status: AC
  Administered 2024-04-05: 1 via OPHTHALMIC

## 2024-04-05 MED ORDER — PHENYLEPHRINE-KETOROLAC 1-0.3 % IO SOLN
INTRAOCULAR | Status: DC | PRN
  Administered 2024-04-05: 500 mL via OPHTHALMIC

## 2024-04-05 MED ORDER — LIDOCAINE HCL (PF) 1 % IJ SOLN
INTRAOCULAR | Status: DC | PRN
  Administered 2024-04-05: 1 mL via OPHTHALMIC

## 2024-04-05 MED ORDER — MOXIFLOXACIN HCL 5 MG/ML IO SOLN
INTRAOCULAR | Status: DC | PRN
  Administered 2024-04-05: .3 mL via INTRACAMERAL

## 2024-04-05 MED ORDER — BSS IO SOLN
INTRAOCULAR | Status: DC | PRN
  Administered 2024-04-05: 15 mL via INTRAOCULAR

## 2024-04-05 SURGICAL SUPPLY — 13 items
CATARACT SUITE SIGHTPATH (MISCELLANEOUS) ×1 IMPLANT
CLOTH BEACON ORANGE TIMEOUT ST (SAFETY) ×1 IMPLANT
EYE SHIELD UNIVERSAL CLEAR (GAUZE/BANDAGES/DRESSINGS) IMPLANT
FEE CATARACT SUITE SIGHTPATH (MISCELLANEOUS) ×1 IMPLANT
GLOVE BIOGEL PI IND STRL 6.5 (GLOVE) IMPLANT
GLOVE BIOGEL PI IND STRL 7.0 (GLOVE) ×2 IMPLANT
LENS IOL EYHANCE TRC 600 20.5 IMPLANT
NDL HYPO 18GX1.5 BLUNT FILL (NEEDLE) ×1 IMPLANT
NEEDLE HYPO 18GX1.5 BLUNT FILL (NEEDLE) ×1 IMPLANT
PAD ARMBOARD POSITIONER FOAM (MISCELLANEOUS) ×1 IMPLANT
SYR TB 1ML LL NO SAFETY (SYRINGE) ×1 IMPLANT
TAPE PAPER 2X10 WHT MICROPORE (GAUZE/BANDAGES/DRESSINGS) IMPLANT
WATER STERILE IRR 250ML POUR (IV SOLUTION) ×1 IMPLANT

## 2024-04-05 NOTE — Anesthesia Postprocedure Evaluation (Signed)
 Anesthesia Post Note  Patient: Natasia A Salva  Procedure(s) Performed: PHACOEMULSIFICATION, CATARACT, WITH IOL INSERTION (Right: Eye) INSERTION, STENT, DRUG-ELUTING, LACRIMAL CANALICULUS (Right: Eye)  Patient location during evaluation: PACU Anesthesia Type: MAC Level of consciousness: awake and alert Pain management: pain level controlled Vital Signs Assessment: post-procedure vital signs reviewed and stable Respiratory status: spontaneous breathing, nonlabored ventilation, respiratory function stable and patient connected to nasal cannula oxygen Cardiovascular status: stable and blood pressure returned to baseline Postop Assessment: no apparent nausea or vomiting Anesthetic complications: no   There were no known notable events for this encounter.   Last Vitals:  Vitals:   04/05/24 0845 04/05/24 0950  BP:  129/81  Pulse: 60 67  Resp: 10 12  Temp:  36.6 C  SpO2: 97% 100%    Last Pain:  Vitals:   04/05/24 0950  TempSrc: Oral  PainSc: 0-No pain                 Zakary Kimura L Shikita Vaillancourt

## 2024-04-05 NOTE — Interval H&P Note (Signed)
 History and Physical Interval Note:  04/05/2024 9:18 AM  Jenna Smith  has presented today for surgery, with the diagnosis of nuclear sclerotic cataract, right eye.  The various methods of treatment have been discussed with the patient and family. After consideration of risks, benefits and other options for treatment, the patient has consented to  Procedure(s) with comments: PHACOEMULSIFICATION, CATARACT, WITH IOL INSERTION (Right) - CDE: INSERTION, STENT, DRUG-ELUTING, LACRIMAL CANALICULUS (Right) as a surgical intervention.  The patient's history has been reviewed, patient examined, no change in status, stable for surgery.  I have reviewed the patient's chart and labs.  Questions were answered to the patient's satisfaction.     Tarri Farm

## 2024-04-05 NOTE — Transfer of Care (Signed)
 Immediate Anesthesia Transfer of Care Note  Patient: Jenna Smith  Procedure(s) Performed: PHACOEMULSIFICATION, CATARACT, WITH IOL INSERTION (Right: Eye) INSERTION, STENT, DRUG-ELUTING, LACRIMAL CANALICULUS (Right: Eye)  Patient Location: Short Stay  Anesthesia Type:MAC  Level of Consciousness: awake, alert , and oriented  Airway & Oxygen Therapy: Patient Spontanous Breathing  Post-op Assessment: Report given to RN and Post -op Vital signs reviewed and stable  Post vital signs: Reviewed and stable  Last Vitals:  Vitals Value Taken Time  BP 129/81 04/05/24 0950  Temp 36.6 C 04/05/24 0950  Pulse 67 04/05/24 0950  Resp 12 04/05/24 0950  SpO2 100 % 04/05/24 0950    Last Pain:  Vitals:   04/05/24 0950  TempSrc: Oral  PainSc: 0-No pain         Complications: No notable events documented.

## 2024-04-05 NOTE — Discharge Instructions (Addendum)
 Please discharge patient when stable, will follow up today with Dr. June Leap at the [facility] office immediately following discharge.  Leave shield in place until visit.  All paperwork with discharge instructions will be given at the office.  [facility] Address:  [address]  [place], [place] [address]

## 2024-04-05 NOTE — Anesthesia Preprocedure Evaluation (Signed)
 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, 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)  negative psych ROS   GI/Hepatic negative GI ROS, Neg liver ROS,,,  Endo/Other  negative endocrine ROS    Renal/GU negative Renal ROS     Musculoskeletal  (+) Arthritis ,    Abdominal   Peds  Hematology  (+) Blood dyscrasia, anemia   Anesthesia Other Findings   Reproductive/Obstetrics                             Anesthesia Physical Anesthesia Plan  ASA: 2  Anesthesia Plan: MAC   Post-op Pain Management:    Induction:   PONV Risk Score and Plan:   Airway Management Planned: Nasal Cannula and Natural Airway  Additional Equipment: None  Intra-op Plan:   Post-operative Plan:   Informed Consent: I have reviewed the patients History and Physical, chart, labs and discussed the procedure including the risks, benefits and alternatives for the proposed anesthesia with the patient or authorized representative who has indicated his/her understanding and acceptance.       Plan Discussed with: CRNA  Anesthesia Plan Comments:         Anesthesia Quick Evaluation

## 2024-04-05 NOTE — Op Note (Signed)
 aDate of procedure: 04/05/24  Pre-operative diagnosis: Visually significant age-related nuclear cataract, Right Eye; Visually Significant Astigmatism, Right Eye (H25.11)  Post-operative diagnosis: Visually significant age-related cataract, Right Eye; Visually Significant Astigmatism, Right Eye Pain and inflammation after cataract surgery, right eye  Procedure:  1.Removal of cataract via phacoemulsification and insertion of intra-ocular lens Jenna Smith and Jenna Smith +20.5D into the capsular bag of the Right Eye 2. Placement of Dextenza  dexamethasone  insert to the right lower eyelid  Attending surgeon: Pleas Brill. Ricquel Foulk, MD, MA  Anesthesia: MAC, Topical Akten  Complications: None  Estimated Blood Loss: <13mL (minimal)  Specimens: None  Implants: As above  Indications:  Visually significant age-related cataract, Right Eye; Visually Significant Astigmatism, Right Eye  Procedure:  The patient was seen and identified in the pre-operative area. The operative eye was identified and dilated.  The operative eye was marked.  Pre-operative toric markers were used to mark the eye at 0 and 180 degrees. Topical anesthesia was administered to the operative eye.     The patient was then to the operative suite and placed in the supine position.  A timeout was performed confirming the patient, procedure to be performed, and all other relevant information.   The patient's face was prepped and draped in the usual fashion for intra-ocular surgery.  A lid speculum was placed into the operative eye and the surgical microscope moved into place and focused.  A superotemporal paracentesis was created using a 20 gauge paracentesis blade.  Shugarcaine was injected into the anterior chamber.  Viscoelastic was injected into the anterior chamber.  A temporal clear-corneal main wound incision was created using a 2.72mm microkeratome.  A continuous curvilinear capsulorrhexis was initiated using an irrigating cystitome and  completed using capsulorrhexis forceps.  Hydrodissection and hydrodeliniation were performed.  Viscoelastic was injected into the anterior chamber.  A phacoemulsification handpiece and a chopper as a second instrument were used to remove the nucleus and epinucleus. The irrigation/aspiration handpiece was used to remove any remaining cortical material.   The capsular bag was reinflated with viscoelastic, checked, and found to be intact.  The intraocular lens was inserted into the capsular bag and dialed into place using a Kuglen hook to 1 degree.  The irrigation/aspiration handpiece was used to remove any remaining viscoelastic.  The clear corneal wound and paracentesis wounds were then hydrated and checked with Weck-Cels to be watertight. 0.1mL of moxifloxacin was injected into the anterior chamber. The lid-speculum was removed.    The lower punctum was dilated and filled with Provisc. A Dextenza  implant was placed in the lower canaliculus without complication.  The drape was removed. The patient's face was cleaned with a wet and dry 4x4. A clear shield was taped over the eye. The patient was taken to the post-operative care unit in good condition, having tolerated the procedure well.  Post-Op Instructions: The patient will follow up at [facility] for a same day post-operative evaluation and will receive all other orders and instructions.

## 2024-04-06 ENCOUNTER — Encounter (HOSPITAL_COMMUNITY): Payer: Self-pay | Admitting: Ophthalmology

## 2024-04-11 DIAGNOSIS — M436 Torticollis: Secondary | ICD-10-CM | POA: Diagnosis not present

## 2024-04-12 DIAGNOSIS — H2512 Age-related nuclear cataract, left eye: Secondary | ICD-10-CM | POA: Diagnosis not present

## 2024-04-14 ENCOUNTER — Ambulatory Visit: Payer: Self-pay

## 2024-04-14 NOTE — Telephone Encounter (Signed)
 FYI Only or Action Required?: FYI only for provider  Patient was last seen in primary care on 02/03/2024 by Eliodoro Guerin, DO. Called Nurse Triage reporting Neck Pain. Symptoms began a week ago. Interventions attempted: OTC medications: NSAID and muscle relaxer. Symptoms are: gradually worsening.  Triage Disposition: See HCP Within 4 Hours (Or PCP Triage)  Patient/caregiver understands and will follow disposition?: Yes     Copied from CRM [id number]. Topic: Clinical - Red Word Triage >> Apr 14, 2024  9:21 AM Rennis Case wrote: Red Word that prompted transfer to Nurse Triage: Hurt neck while putting sports bra on, pt having pain from neck down to her back on the left side Reason for Disposition  SEVERE neck pain (e.g., excruciating)    Ranges from manageable to 10/10 pain when trying to sleep/lie down  Answer Assessment - Initial Assessment Questions 1. MECHANISM: How did the injury happen? (e.g., fall, MVA, twisting injury; consider the possibility of domestic violence or elder abuse)     Patient was putting sports bra on and has to move differently due to having left shoulder problems. Felt a zap and felt injured.  2. ONSET: When did the injury happen? (e.g., minutes, hours, days)     A little over a week ago 3. LOCATION: What part of the neck is injured? Where does it hurt?     Side of neck, under jaw, to base of skull, and towards her back 4. PAIN SEVERITY: How bad is the pain? Can you move the neck normally? (Scale 1-10; or mild, moderate, severe)   - NO PAIN (0): no pain, or only slight stiffness    - MILD (1-3): doesn't interfere with normal activities    - MODERATE (4-7): interferes with normal activities or awakens from sleep    - SEVERE (8-10):  excruciating pain, unable to do any normal activities       It ranges up to 10 when she lays down to sleep 5. CORD SYMPTOMS: Any weakness or numbness of the arms or legs?     No 6. SIZE: For cuts, bruises, or swelling,  ask: How large is it? (e.g., inches or centimeters)      Very mildly swollen 8. OTHER SYMPTOMS: Do you have any other symptoms? (e.g., headache)     Patient has mild swelling from top of shoulder towards neck  Denies: chest pain, noticeable bruising Patient was seen in UC last week and given a steroid shot but states the pain hasn't improved. No x Ray was performed.  Protocols used: Neck Injury-A-AH

## 2024-04-14 NOTE — Telephone Encounter (Signed)
 E2C2 scheduled appointment.

## 2024-04-15 ENCOUNTER — Encounter (HOSPITAL_COMMUNITY): Payer: Self-pay

## 2024-04-15 ENCOUNTER — Encounter: Payer: Self-pay | Admitting: Nurse Practitioner

## 2024-04-15 ENCOUNTER — Ambulatory Visit: Admitting: Nurse Practitioner

## 2024-04-15 ENCOUNTER — Encounter (HOSPITAL_COMMUNITY)
Admission: RE | Admit: 2024-04-15 | Discharge: 2024-04-15 | Disposition: A | Discharge status: Home / Self Care | Payer: PRIVATE HEALTH INSURANCE | Source: Ambulatory Visit | Attending: Ophthalmology | Admitting: Ophthalmology

## 2024-04-15 VITALS — BP 109/66 | HR 71 | Temp 97.0°F | Ht 66.0 in | Wt 178.6 lb

## 2024-04-15 DIAGNOSIS — M542 Cervicalgia: Secondary | ICD-10-CM

## 2024-04-15 NOTE — Progress Notes (Signed)
 Acute Office Visit  Subjective:     Patient ID: Jenna Smith, female    DOB: 1951/10/28, 73 y.o.   MRN: [id number]  Chief Complaint  Patient presents with   Neck Pain    Pulled muscle in neck last week but is feeling better today    HPI Jenna Smith is a 73 year old female presenting April 15 2024 for an acute visit concerns for neck pain. She was seen at an urgent care over the weekend Neck Pain: Paitent complains of neck pain. Event that precipitate these symptoms: none known. Onset of symptoms 1 week ago, completely resolved since that time. Current symptoms are pain in neck (aching in character; 0/10 in severity). Patient has had no prior neck problems.  Previous treatments include: none.  I woke up this morning the pain is gone. She has ben taking been taking Difloconac and Robaxin  that was prescribed at urgent which help the pain.   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   Diarrhea 05/07/2021   Abdominal pain, epigastric 05/07/2021   Colon cancer screening 05/07/2021   Dizziness 03/02/2020   TBI (traumatic brain injury) (HCC) 03/02/2020   Traumatic brain injury (HCC) 01/07/2018   Symptomatic mammary hypertrophy 10/03/2021   Back pain 10/03/2021   Neck pain 10/03/2021   Resolved Ambulatory Problems    Diagnosis Date Noted   No Resolved Ambulatory Problems   Past Medical History:  Diagnosis Date   Allergy     Arthritis     ROS Negative unless indicated in HPI    Objective:    BP 109/66   Pulse 71   Temp (!) 97 F (36.1 C) (Temporal)   Ht 5' 6 (1.676 m)   Wt 178 lb 9.6 oz  (81 kg)   SpO2 96%   BMI 28.83 kg/m  BP Readings from Last 3 Encounters:  04/15/24 109/66  04/05/24 129/81  02/03/24 139/81   Wt Readings from Last 3 Encounters:  04/15/24 178 lb 9.6 oz (81 kg)  04/01/24 179 lb 14.3 oz (81.6 kg)  02/03/24 182 lb 6.4 oz (82.7 kg)      Physical Exam Vitals and nursing note reviewed.  Constitutional:      General: She is not in acute distress. HENT:     Head: Normocephalic and atraumatic.     Nose: Nose normal.     Mouth/Throat:     Mouth: Mucous membranes are moist.   Eyes:     General: No scleral icterus.    Extraocular Movements: Extraocular movements intact.     Conjunctiva/sclera: Conjunctivae normal.     Pupils: Pupils are equal, round, and reactive to light.   Neck:     Thyroid : No thyroid  mass, thyromegaly or thyroid  tenderness.   Cardiovascular:     Heart sounds: Normal heart sounds.  Pulmonary:     Effort: Pulmonary effort is normal.     Breath sounds: Normal breath sounds.   Musculoskeletal:        General: Normal range of motion.     Cervical back: Full passive range of motion without pain.     Right lower  leg: No edema.     Left lower leg: No edema.   Neurological:     Mental Status: She is alert.     No results found for any visits on 04/15/24.      Assessment & Plan:  Neck pain  Jenna Smith is a 73 yrs old Caucasian female seen today fro neck pain, no acute distress Pain completed resolved, continue taking Difloconac and Robaxin  previously prescribed.  The above assessment and management plan was discussed with the patient. The patient verbalized understanding of and has agreed to the management plan. Patient is aware to call the clinic if they develop any new symptoms or if symptoms persist or worsen. Patient is aware when to return to the clinic for a follow-up visit. Patient educated on when it is appropriate to go to the emergency department.  Return if symptoms worsen or fail to improve. Jenna Smith [place]  Thompson, DNP [facility] [address] [place], [place] [address] [phone]  Note: This document was prepared by [employer] voice dictation technology and any errors that results from this process are unintentional.

## 2024-04-15 NOTE — H&P (Signed)
 Surgical History & Physical  Patient Name: Jenna Smith  DOB: 27-Aug-1951  Surgery: Cataract extraction with intraocular lens implant phacoemulsification; Left Eye Surgeon: Tarri Farm MD Surgery Date: 04/19/2024 Pre-Op Date: 04/12/2024  HPI: A 59 Yr. old female patient 1. The patient is returning for a cataract follow-up of the right eye. Since the last visit, the affected area is doing well. The patient's vision is improved. The condition's severity is constant. Patient has finished meds as instructed. Pt. is ready to proceed with surgery in left due to blurred vision with close vision. The patient has very imbalanced vision between the two eyes. This is negatively affecting the patient's quality of life and the patient is unable to function adequately in life with the current level of vision. HPI Completed by Dr. Tarri Farm  Medical History: Cataracts  Allergies Arthritis  Review of Systems Allergic/Immunologic Seasonal Allergies Musculoskeletal arthritis All recorded systems are negative except as noted above.  Social Never smoked  Medication Pred-Moxi-Brom,  Mupirocin , Amitriptyline , Diclofenac  sodium  Sx/Procedures Phaco c IOL OD - dextenza ,  Breast Reduction, Knee Replacement  Drug Allergies  Pencillins, Latex gloves  History & Physical: Heent: cataract NECK: supple without bruits LUNGS: lungs clear to auscultation CV: regular rate and rhythm Abdomen: soft and non-tender  Impression & Plan: Assessment: 1.  CATARACT EXTRACTION STATUS; Right Eye (Z98.41) 2.  NUCLEAR SCLEROSIS AGE RELATED; , Left Eye (H25.12) 3.  ASTIGMATISM, REGULAR; Both Eyes (H52.223)  Plan: 1.  1 week after cataract surgery. Doing well with improved vision and normal eye pressure. Call with any problems or concerns. Continue Pred-Moxi-Brom 2x/day for 3 more weeks.  2.  Cataract accounts for the patient's decreased vision. This visual impairment is not correctable with a tolerable  change in glasses or contact lenses. Cataract surgery with an implantation of a new lens should significantly improve the visual and functional status of the patient.Discussed all risks, benefits, alternatives, and potential complications. Discussed the procedures and recovery. Patient desires to have surgery. A-scan ordered and performed today for intra-ocular lens calculations. The surgery will be performed in order to improve vision for driving, reading, and for eye examinations. Recommend phacoemulsification with intra-ocular lens. Recommend Dextenza  for post-operative pain and inflammation. History of refractive Surgery: None Use of Eye Pressure Lowering Drops: None Left Eye. Surgery required to correct imbalance of vision. Dilates well - shugarcaine or Lidocaine +Omidira by protocol Toric Lens.  3.  Toric lens recommended OU.

## 2024-04-19 ENCOUNTER — Ambulatory Visit (HOSPITAL_BASED_OUTPATIENT_CLINIC_OR_DEPARTMENT_OTHER): Admitting: Anesthesiology

## 2024-04-19 ENCOUNTER — Other Ambulatory Visit: Payer: Self-pay

## 2024-04-19 ENCOUNTER — Encounter (HOSPITAL_COMMUNITY)
Admission: RE | Disposition: A | Discharge status: Home / Self Care | Payer: Self-pay | Source: Home / Self Care | Attending: Ophthalmology

## 2024-04-19 ENCOUNTER — Ambulatory Visit (HOSPITAL_COMMUNITY): Admitting: Anesthesiology

## 2024-04-19 ENCOUNTER — Ambulatory Visit (HOSPITAL_COMMUNITY)
Admission: RE | Admit: 2024-04-19 | Discharge: 2024-04-19 | Disposition: A | Discharge status: Home / Self Care | Payer: PRIVATE HEALTH INSURANCE | Attending: Ophthalmology | Admitting: Ophthalmology

## 2024-04-19 ENCOUNTER — Encounter (HOSPITAL_COMMUNITY): Payer: Self-pay | Admitting: Ophthalmology

## 2024-04-19 DIAGNOSIS — H2512 Age-related nuclear cataract, left eye: Secondary | ICD-10-CM | POA: Insufficient documentation

## 2024-04-19 DIAGNOSIS — H52223 Regular astigmatism, bilateral: Secondary | ICD-10-CM | POA: Diagnosis not present

## 2024-04-19 DIAGNOSIS — Z87891 Personal history of nicotine dependence: Secondary | ICD-10-CM | POA: Insufficient documentation

## 2024-04-19 DIAGNOSIS — Z8673 Personal history of transient ischemic attack (TIA), and cerebral infarction without residual deficits: Secondary | ICD-10-CM | POA: Diagnosis not present

## 2024-04-19 DIAGNOSIS — H259 Unspecified age-related cataract: Secondary | ICD-10-CM | POA: Diagnosis not present

## 2024-04-19 DIAGNOSIS — D649 Anemia, unspecified: Secondary | ICD-10-CM | POA: Diagnosis not present

## 2024-04-19 DIAGNOSIS — I679 Cerebrovascular disease, unspecified: Secondary | ICD-10-CM | POA: Diagnosis not present

## 2024-04-19 DIAGNOSIS — Z9841 Cataract extraction status, right eye: Secondary | ICD-10-CM | POA: Diagnosis not present

## 2024-04-19 DIAGNOSIS — H5712 Ocular pain, left eye: Secondary | ICD-10-CM | POA: Insufficient documentation

## 2024-04-19 HISTORY — PX: INSERTION, STENT, DRUG-ELUTING, LACRIMAL CANALICULUS: SHX7453

## 2024-04-19 HISTORY — PX: CATARACT EXTRACTION W/PHACO: SHX586

## 2024-04-19 SURGERY — PHACOEMULSIFICATION, CATARACT, WITH IOL INSERTION
Anesthesia: Monitor Anesthesia Care | Site: Eye | Laterality: Left

## 2024-04-19 MED ORDER — LIDOCAINE HCL 3.5 % OP GEL: 1.0000 | Freq: Once | OPHTHALMIC | Status: DC

## 2024-04-19 MED ORDER — BSS IO SOLN
INTRAOCULAR | Status: DC | PRN
  Administered 2024-04-19: 15 mL via INTRAOCULAR

## 2024-04-19 MED ORDER — LACTATED RINGERS IV SOLN: INTRAVENOUS | Status: DC

## 2024-04-19 MED ORDER — DEXAMETHASONE 0.4 MG OP INST
VAGINAL_INSERT | OPHTHALMIC | Status: AC
  Filled 2024-04-19: qty 1

## 2024-04-19 MED ORDER — SODIUM HYALURONATE 23MG/ML IO SOSY
PREFILLED_SYRINGE | INTRAOCULAR | Status: DC | PRN
  Administered 2024-04-19: .6 mL via INTRAOCULAR

## 2024-04-19 MED ORDER — TROPICAMIDE 1 % OP SOLN
1.0000 [drp] | OPHTHALMIC | Status: AC | PRN
  Administered 2024-04-19 (×3): 1 [drp] via OPHTHALMIC

## 2024-04-19 MED ORDER — DEXAMETHASONE 0.4 MG OP INST
VAGINAL_INSERT | OPHTHALMIC | Status: DC | PRN
  Administered 2024-04-19: .4 mg via OPHTHALMIC

## 2024-04-19 MED ORDER — PHENYLEPHRINE HCL 2.5 % OP SOLN
1.0000 [drp] | OPHTHALMIC | Status: AC | PRN
  Administered 2024-04-19 (×3): 1 [drp] via OPHTHALMIC

## 2024-04-19 MED ORDER — MOXIFLOXACIN HCL 5 MG/ML IO SOLN
INTRAOCULAR | Status: DC | PRN
  Administered 2024-04-19: .2 mL via INTRACAMERAL

## 2024-04-19 MED ORDER — POVIDONE-IODINE 5 % OP SOLN
OPHTHALMIC | Status: DC | PRN
  Administered 2024-04-19: 1 via OPHTHALMIC

## 2024-04-19 MED ORDER — PHENYLEPHRINE-KETOROLAC 1-0.3 % IO SOLN
INTRAOCULAR | Status: DC | PRN
  Administered 2024-04-19: 500 mL via OPHTHALMIC

## 2024-04-19 MED ORDER — STERILE WATER FOR IRRIGATION IR SOLN
Status: DC | PRN
  Administered 2024-04-19: 250 mL

## 2024-04-19 MED ORDER — SODIUM HYALURONATE 10 MG/ML IO SOLUTION
PREFILLED_SYRINGE | INTRAOCULAR | Status: DC | PRN
  Administered 2024-04-19: .85 mL via INTRAOCULAR

## 2024-04-19 MED ORDER — TETRACAINE HCL 0.5 % OP SOLN
1.0000 [drp] | OPHTHALMIC | Status: AC | PRN
  Administered 2024-04-19 (×3): 1 [drp] via OPHTHALMIC

## 2024-04-19 SURGICAL SUPPLY — 13 items
CATARACT SUITE SIGHTPATH (MISCELLANEOUS) ×1 IMPLANT
CLOTH BEACON ORANGE TIMEOUT ST (SAFETY) ×1 IMPLANT
EYE SHIELD UNIVERSAL CLEAR (GAUZE/BANDAGES/DRESSINGS) IMPLANT
FEE CATARACT SUITE SIGHTPATH (MISCELLANEOUS) ×1 IMPLANT
GLOVE BIOGEL PI IND STRL 7.0 (GLOVE) ×2 IMPLANT
LENS IOL EYHANCE TRC 300 22.0 IMPLANT
NDL HYPO 18GX1.5 BLUNT FILL (NEEDLE) ×1 IMPLANT
NEEDLE HYPO 18GX1.5 BLUNT FILL (NEEDLE) ×1 IMPLANT
PAD ARMBOARD POSITIONER FOAM (MISCELLANEOUS) ×1 IMPLANT
SYR TB 1ML LL NO SAFETY (SYRINGE) ×1 IMPLANT
TAPE PAPER 2X10 WHT MICROPORE (GAUZE/BANDAGES/DRESSINGS) IMPLANT
TAPE SURG TRANSPORE 1 IN (GAUZE/BANDAGES/DRESSINGS) IMPLANT
WATER STERILE IRR 250ML POUR (IV SOLUTION) ×1 IMPLANT

## 2024-04-19 NOTE — Anesthesia Preprocedure Evaluation (Signed)
 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, 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)  negative psych ROS   GI/Hepatic negative GI ROS, Neg liver ROS,,,  Endo/Other  negative endocrine ROS    Renal/GU negative Renal ROS     Musculoskeletal  (+) Arthritis ,    Abdominal   Peds  Hematology  (+) Blood dyscrasia, anemia   Anesthesia Other Findings   Reproductive/Obstetrics                             Anesthesia Physical Anesthesia Plan  ASA: 2  Anesthesia Plan: MAC   Post-op Pain Management:    Induction:   PONV Risk Score and Plan:   Airway Management Planned: Nasal Cannula and Natural Airway  Additional Equipment: None  Intra-op Plan:   Post-operative Plan:   Informed Consent: I have reviewed the patients History and Physical, chart, labs and discussed the procedure including the risks, benefits and alternatives for the proposed anesthesia with the patient or authorized representative who has indicated his/her understanding and acceptance.       Plan Discussed with: CRNA  Anesthesia Plan Comments:         Anesthesia Quick Evaluation

## 2024-04-19 NOTE — Transfer of Care (Signed)
 Immediate Anesthesia Transfer of Care Note  Patient: Jenna Smith  Procedure(s) Performed: PHACOEMULSIFICATION, CATARACT, WITH IOL INSERTION (Left: Eye) INSERTION, STENT, DRUG-ELUTING, LACRIMAL CANALICULUS (Left: Eye)  Patient Location: Short Stay  Anesthesia Type:MAC  Level of Consciousness: awake, alert , and oriented  Airway & Oxygen Therapy: Patient Spontanous Breathing  Post-op Assessment: Report given to RN and Post -op Vital signs reviewed and stable  Post vital signs: Reviewed and stable  Last Vitals:  Vitals Value Taken Time  BP 102/87 04/19/24 11:47  Temp 36.5 C 04/19/24 11:47  Pulse 68 04/19/24 11:47  Resp 18 04/19/24 11:47  SpO2 100 % 04/19/24 11:47    Last Pain:  Vitals:   04/19/24 1148  TempSrc:   PainSc: 0-No pain      Patients Stated Pain Goal: 4 (04/19/24 1148)  Complications: No notable events documented.

## 2024-04-19 NOTE — Interval H&P Note (Signed)
 History and Physical Interval Note:  04/19/2024 11:24 AM  Jenna Smith  has presented today for surgery, with the diagnosis of nuclear sclerotic cataract, left eye.  The various methods of treatment have been discussed with the patient and family. After consideration of risks, benefits and other options for treatment, the patient has consented to  Procedure(s) with comments: PHACOEMULSIFICATION, CATARACT, WITH IOL INSERTION (Left) - CDE: INSERTION, STENT, DRUG-ELUTING, LACRIMAL CANALICULUS (Left) as a surgical intervention.  The patient's history has been reviewed, patient examined, no change in status, stable for surgery.  I have reviewed the patient's chart and labs.  Questions were answered to the patient's satisfaction.     HARRIE AGENT

## 2024-04-19 NOTE — Discharge Instructions (Addendum)
 Please discharge patient when stable, will follow up today with Dr. June Leap at the [facility] office immediately following discharge.  Leave shield in place until visit.  All paperwork with discharge instructions will be given at the office.  [facility] Address:  [address]  [place], [place] [address]

## 2024-04-19 NOTE — Anesthesia Postprocedure Evaluation (Signed)
 Anesthesia Post Note  Patient: Jenna Smith  Procedure(s) Performed: PHACOEMULSIFICATION, CATARACT, WITH IOL INSERTION (Left: Eye) INSERTION, STENT, DRUG-ELUTING, LACRIMAL CANALICULUS (Left: Eye)  Patient location during evaluation: Phase II Anesthesia Type: MAC Level of consciousness: awake Pain management: pain level controlled Vital Signs Assessment: post-procedure vital signs reviewed and stable Respiratory status: spontaneous breathing and respiratory function stable Cardiovascular status: blood pressure returned to baseline and stable Postop Assessment: no headache and no apparent nausea or vomiting Anesthetic complications: no Comments: Late entry   No notable events documented.   Last Vitals:  Vitals:   04/19/24 1024 04/19/24 1147  BP: 112/63 102/87  Pulse: 67 68  Resp: 17 18  Temp: 36.7 C 36.5 C  SpO2: 99% 100%    Last Pain:  Vitals:   04/19/24 1148  TempSrc:   PainSc: 0-No pain                 Yvonna JINNY Bosworth

## 2024-04-19 NOTE — Op Note (Signed)
 Date of procedure: 04/19/24  Pre-operative diagnosis: Visually significant age-related cataract, Left Eye; Visually Significant Astigmatism, Left Eye (H25.?2)  Post-operative diagnosis:  Visually significant age-related cataract, Left Eye; Visually Significant Astigmatism, Left Eye Pain and inflammation after cataract surgery, left eye  Procedure:  Removal of cataract via phacoemulsification and insertion of intra-ocular lens Vicci and Johnson DIU300 +22.0D into the capsular bag of the Left Eye Placement of Dextenza  insert, left lower eyelid  Attending surgeon: Lynwood LABOR. Tonilynn Bieker, MD, MA  Anesthesia: MAC, Topical Akten   Complications: None  Estimated Blood Loss: <69mL (minimal)  Specimens: None  Implants: As above  Indications:  Visually significant age-related cataract, Left Eye; Visually Significant Astigmatism, Left Eye  Procedure:  The patient was seen and identified in the pre-operative area. The operative eye was identified and dilated.  The operative eye was marked.  Pre-operative toric markers were used to mark the eye at 0 and 180 degrees. Topical anesthesia was administered to the operative eye.     The patient was then to the operative suite and placed in the supine position.  A timeout was performed confirming the patient, procedure to be performed, and all other relevant information.   The patient's face was prepped and draped in the usual fashion for intra-ocular surgery.  A lid speculum was placed into the operative eye and the surgical microscope moved into place and focused.  A superotemporal paracentesis was created using a 20 gauge paracentesis blade. Omidria  was injected into the anterior chamber. Shugarcaine was injected into the anterior chamber.  Viscoelastic was injected into the anterior chamber.  A temporal clear-corneal main wound incision was created using a 2.22mm microkeratome.  A continuous curvilinear capsulorrhexis was initiated using an irrigating  cystitome and completed using capsulorrhexis forceps.  Hydrodissection and hydrodeliniation were performed.  Viscoelastic was injected into the anterior chamber.  A phacoemulsification handpiece and a chopper as a second instrument were used to remove the nucleus and epinucleus. The irrigation/aspiration handpiece was used to remove any remaining cortical material.   The capsular bag was reinflated with viscoelastic, checked, and found to be intact.  The eye was marked to the per-op meridian.  The intraocular lens was inserted into the capsular bag and dialed into place using a Kuglen hook to 178 degrees.  The irrigation/aspiration handpiece was used to remove any remaining viscoelastic.  The clear corneal wound and paracentesis wounds were then hydrated and checked with Weck-Cels to be watertight. 0.1mL of moxifloxacin  was injected into the anterior chamber. The lid-speculum was removed.    The lower punctum was dilated and filled with Provisc. A Dextenza  implant was placed in the lower canaliculus without complication.  The drape was removed. The patient's face was cleaned with a wet and dry 4x4.   A clear shield was taped over the eye. The patient was taken to the post-operative care unit in good condition, having tolerated the procedure well.  Post-Op Instructions: The patient will follow up at [facility] for a same day post-operative evaluation and will receive all other orders and instructions.

## 2024-04-20 DIAGNOSIS — M9904 Segmental and somatic dysfunction of sacral region: Secondary | ICD-10-CM | POA: Diagnosis not present

## 2024-04-20 DIAGNOSIS — M9903 Segmental and somatic dysfunction of lumbar region: Secondary | ICD-10-CM | POA: Diagnosis not present

## 2024-04-20 DIAGNOSIS — M62830 Muscle spasm of back: Secondary | ICD-10-CM | POA: Diagnosis not present

## 2024-04-20 DIAGNOSIS — M9905 Segmental and somatic dysfunction of pelvic region: Secondary | ICD-10-CM | POA: Diagnosis not present

## 2024-04-21 DIAGNOSIS — M9905 Segmental and somatic dysfunction of pelvic region: Secondary | ICD-10-CM | POA: Diagnosis not present

## 2024-04-21 DIAGNOSIS — M9903 Segmental and somatic dysfunction of lumbar region: Secondary | ICD-10-CM | POA: Diagnosis not present

## 2024-04-21 DIAGNOSIS — M62830 Muscle spasm of back: Secondary | ICD-10-CM | POA: Diagnosis not present

## 2024-04-21 DIAGNOSIS — M9904 Segmental and somatic dysfunction of sacral region: Secondary | ICD-10-CM | POA: Diagnosis not present

## 2024-04-23 ENCOUNTER — Encounter (HOSPITAL_COMMUNITY): Payer: Self-pay | Admitting: Ophthalmology

## 2024-04-29 DIAGNOSIS — M9904 Segmental and somatic dysfunction of sacral region: Secondary | ICD-10-CM | POA: Diagnosis not present

## 2024-04-29 DIAGNOSIS — M62830 Muscle spasm of back: Secondary | ICD-10-CM | POA: Diagnosis not present

## 2024-04-29 DIAGNOSIS — M9903 Segmental and somatic dysfunction of lumbar region: Secondary | ICD-10-CM | POA: Diagnosis not present

## 2024-04-29 DIAGNOSIS — M9905 Segmental and somatic dysfunction of pelvic region: Secondary | ICD-10-CM | POA: Diagnosis not present

## 2024-05-03 DIAGNOSIS — M9905 Segmental and somatic dysfunction of pelvic region: Secondary | ICD-10-CM | POA: Diagnosis not present

## 2024-05-03 DIAGNOSIS — M9904 Segmental and somatic dysfunction of sacral region: Secondary | ICD-10-CM | POA: Diagnosis not present

## 2024-05-03 DIAGNOSIS — M62830 Muscle spasm of back: Secondary | ICD-10-CM | POA: Diagnosis not present

## 2024-05-03 DIAGNOSIS — M9903 Segmental and somatic dysfunction of lumbar region: Secondary | ICD-10-CM | POA: Diagnosis not present

## 2024-05-06 ENCOUNTER — Encounter (INDEPENDENT_AMBULATORY_CARE_PROVIDER_SITE_OTHER): Payer: Self-pay | Admitting: *Deleted

## 2024-05-10 DIAGNOSIS — M9903 Segmental and somatic dysfunction of lumbar region: Secondary | ICD-10-CM | POA: Diagnosis not present

## 2024-05-10 DIAGNOSIS — M9905 Segmental and somatic dysfunction of pelvic region: Secondary | ICD-10-CM | POA: Diagnosis not present

## 2024-05-10 DIAGNOSIS — M9904 Segmental and somatic dysfunction of sacral region: Secondary | ICD-10-CM | POA: Diagnosis not present

## 2024-05-10 DIAGNOSIS — M62830 Muscle spasm of back: Secondary | ICD-10-CM | POA: Diagnosis not present

## 2024-05-11 ENCOUNTER — Ambulatory Visit: Payer: Self-pay

## 2024-05-11 ENCOUNTER — Ambulatory Visit: Admitting: Nurse Practitioner

## 2024-05-11 ENCOUNTER — Encounter: Payer: Self-pay | Admitting: Nurse Practitioner

## 2024-05-11 VITALS — BP 122/81 | HR 72 | Temp 96.3°F | Ht 66.0 in | Wt 172.0 lb

## 2024-05-11 DIAGNOSIS — R252 Cramp and spasm: Secondary | ICD-10-CM | POA: Diagnosis not present

## 2024-05-11 DIAGNOSIS — M542 Cervicalgia: Secondary | ICD-10-CM | POA: Diagnosis not present

## 2024-05-11 MED ORDER — KETOROLAC TROMETHAMINE 60 MG/2ML IM SOLN
60.0000 mg | Freq: Once | INTRAMUSCULAR | Status: AC
Start: 2024-05-11 — End: 2024-05-11
  Administered 2024-05-11: 60 mg via INTRAMUSCULAR

## 2024-05-11 MED ORDER — METHYLPREDNISOLONE ACETATE 80 MG/ML IJ SUSP
80.0000 mg | Freq: Once | INTRAMUSCULAR | Status: AC
  Administered 2024-05-11: 80 mg via INTRAMUSCULAR

## 2024-05-11 NOTE — Telephone Encounter (Signed)
 APPOINTMENT MADE FOR TODAY 05/11/2024 AT 11:20 AM AT PCP OFFICE WITH THE DOCTOR OF THE DAY    FYI Only or Action Required?: FYI only for provider.  Patient was last seen in primary care on 04/15/2024 by Deitra Morton Sebastian Nena, NP.  Called Nurse Triage reporting Muscle Pain.  Symptoms began several months ago.  Interventions attempted: Rest, hydration, or home remedies and Other: PATIENT STATES SHE HAS BEEN SEEN FOR THESE ACHES/PAINS BEFORE & TOLD ARTHRITIS.  Symptoms are: unchanged.  Triage Disposition: See PCP When Office is Open (Within 3 Days)  Patient/caregiver understands and will follow disposition?: Yes                              Copied from CRM [id number]. Topic: Clinical - Red Word Triage >> May 11, 2024  8:23 AM Essie A wrote: Red Word that prompted transfer to Nurse Triage: Pain in shoulders, neck, back and legs going down to calves Reason for Disposition  [1] MODERATE pain (e.g., interferes with normal activities) AND [2] present > 3 days  Answer Assessment - Initial Assessment Questions 1. ONSET: When did the muscle aches or body pains start?      For months 2. LOCATION: What part of your body is hurting? (e.g., entire body, arms, legs)      Cramping in calf muscles if she stretches, back, and hips and thighs, shoulders, neck  --- 3. SEVERITY: How bad is the pain? (Scale 1-10; or mild, moderate, severe)     7 when its bad  but patient states she is fine during triage 4. CAUSE: What do you think is causing the pains?     Arthritis 5. FEVER: Do you have a fever? If Yes, ask: What is your temperature, how was it measured, and  when did it start?      No 6. OTHER SYMPTOMS: Do you have any other symptoms? (e.g., chest pain, cold or flu symptoms, rash, weakness, weight loss)     No  Protocols used: Muscle Aches and Body Pain-A-AH

## 2024-05-11 NOTE — Patient Instructions (Signed)
 Muscle Cramps and Spasms Muscle cramps and spasms happen when muscles tighten on their own. They can be in any muscle. They happen most often in the muscles in the back of your lower leg (calf). Muscle cramps are painful. They are often stronger and last longer than muscle spasms. Muscle spasms may or may not be painful. In many cases, the cause of a muscle cramp or spasm is not known. But it may be from: Doing more work or exercise than your body is ready for. Using the muscles too much (overuse). Staying in one position for too long. Not preparing enough or having bad form when you play a sport or do an activity. Getting hurt. Other causes may include: Not enough water in your body (dehydration). Taking certain medicines. Not having enough salts and minerals in the body (electrolytes). Follow these instructions at home: Eating and drinking Drink enough fluid to keep your pee (urine) pale yellow. Eat a healthy diet to help your muscles work well. Your diet should include: Fruits and vegetables. Lean protein. Whole grains. Low-fat or nonfat dairy products. Managing pain and stiffness     Massage, stretch, and relax the muscle. Do this for a few minutes at a time. If told, put ice on the muscle. This may help if you are sore or have pain after a cramp or spasm. Put ice in a plastic bag. Place a towel between your skin and the bag. Leave the ice on for 20 minutes, 2-3 times a day. If told, put heat on tight or tense muscles. Do this as often as told by your doctor. Use the heat source that your doctor recommends, such as a moist heat pack or a heating pad. Place a towel between your skin and the heat source. Leave the heat on for 20-30 minutes. If your skin turns bright red, take off the ice or heat right away to prevent skin damage. The risk of damage is higher if you cannot feel pain, heat, or cold. Take hot showers or baths to help relax the muscles. General instructions If you  are having cramps often, avoid intense exercise for a few days. Take over-the-counter and prescription medicines only as told by your doctor. Watch for any changes in your symptoms. Contact a doctor if: Your cramps or spasms get worse or happen more often. Your cramps or spasms do not get better with time. This information is not intended to replace advice given to you by your health care provider. Make sure you discuss any questions you have with your health care provider. Document Revised: 06/04/2022 Document Reviewed: 06/04/2022 Elsevier Patient Education  2024 [employer].

## 2024-05-11 NOTE — Progress Notes (Signed)
   Subjective:    Patient ID: Jenna Smith, female    DOB: 1951-03-10, 73 y.o.   MRN: [id number]   Chief Complaint: pain across back of shoulders, Neck Pain, and bilateral leg pain at night   Neck Pain  Pertinent negatives include no chest pain, headaches or weakness.    Patient in c/o pain across shoulders bil with neck pain. Started months ago. She says she needs ;eft shoulder surgery and not sure if that is some of the pain. She says she has arthritis allover and sees Dr. Bonner. Rates pain 2/10 currently. Activity increases pain. She also says she has leg pain at night and pickle juice will help pain.      Review of Systems  Constitutional:  Negative for diaphoresis.  Eyes:  Negative for pain.  Respiratory:  Negative for shortness of breath.   Cardiovascular:  Negative for chest pain, palpitations and leg swelling.  Gastrointestinal:  Negative for abdominal pain.  Endocrine: Negative for polydipsia.  Musculoskeletal:  Positive for neck pain.  Skin:  Negative for rash.  Neurological:  Negative for dizziness, weakness and headaches.  Hematological:  Does not bruise/bleed easily.  All other systems reviewed and are negative.      Objective:   Physical Exam Constitutional:      Appearance: Normal appearance.  Cardiovascular:     Rate and Rhythm: Normal rate and regular rhythm.     Heart sounds: Normal heart sounds.  Pulmonary:     Breath sounds: Normal breath sounds.  Musculoskeletal:     Comments: FROM of cervical spine with pain on flexion and extension  Skin:    General: Skin is warm and dry.  Neurological:     General: No focal deficit present.     Mental Status: She is alert and oriented to person, place, and time.  Psychiatric:        Mood and Affect: Mood normal.        Behavior: Behavior normal.     BP 122/81   Pulse 72   Temp (!) 96.3 F (35.7 C) (Temporal)   Ht 5' 6 (1.676 m)   Wt 172 lb (78 kg)   SpO2 95%   BMI 27.76 kg/m         Assessment & Plan:   Jenna Smith in today with chief complaint of pain across back of shoulders, Neck Pain, and bilateral leg pain at night   1. Neck pain (Primary) Moist heat rest - methylPREDNISolone  acetate (DEPO-MEDROL ) injection 80 mg - ketorolac  (TORADOL ) injection 60 mg  2. Muscle cramps Quinine water  or seltzer water  at night Magnesium supplements are okay Keep legs warm at night RTOprn    The above assessment and management plan was discussed with the patient. The patient verbalized understanding of and has agreed to the management plan. Patient is aware to call the clinic if symptoms persist or worsen. Patient is aware when to return to the clinic for a follow-up visit. Patient educated on when it is appropriate to go to the emergency department.   Jenna Gladis, FNP

## 2024-05-11 NOTE — Telephone Encounter (Signed)
 Apt today. Ls

## 2024-05-17 DIAGNOSIS — M9903 Segmental and somatic dysfunction of lumbar region: Secondary | ICD-10-CM | POA: Diagnosis not present

## 2024-05-17 DIAGNOSIS — M9905 Segmental and somatic dysfunction of pelvic region: Secondary | ICD-10-CM | POA: Diagnosis not present

## 2024-05-17 DIAGNOSIS — M62830 Muscle spasm of back: Secondary | ICD-10-CM | POA: Diagnosis not present

## 2024-05-17 DIAGNOSIS — M9904 Segmental and somatic dysfunction of sacral region: Secondary | ICD-10-CM | POA: Diagnosis not present

## 2024-05-24 DIAGNOSIS — M9905 Segmental and somatic dysfunction of pelvic region: Secondary | ICD-10-CM | POA: Diagnosis not present

## 2024-05-24 DIAGNOSIS — M9904 Segmental and somatic dysfunction of sacral region: Secondary | ICD-10-CM | POA: Diagnosis not present

## 2024-05-24 DIAGNOSIS — M9903 Segmental and somatic dysfunction of lumbar region: Secondary | ICD-10-CM | POA: Diagnosis not present

## 2024-05-24 DIAGNOSIS — M62830 Muscle spasm of back: Secondary | ICD-10-CM | POA: Diagnosis not present

## 2024-05-31 DIAGNOSIS — M9903 Segmental and somatic dysfunction of lumbar region: Secondary | ICD-10-CM | POA: Diagnosis not present

## 2024-05-31 DIAGNOSIS — M62830 Muscle spasm of back: Secondary | ICD-10-CM | POA: Diagnosis not present

## 2024-05-31 DIAGNOSIS — M9904 Segmental and somatic dysfunction of sacral region: Secondary | ICD-10-CM | POA: Diagnosis not present

## 2024-05-31 DIAGNOSIS — M9905 Segmental and somatic dysfunction of pelvic region: Secondary | ICD-10-CM | POA: Diagnosis not present

## 2024-06-07 ENCOUNTER — Ambulatory Visit: Payer: Self-pay

## 2024-06-07 NOTE — Telephone Encounter (Signed)
 FYI Only or Action Required?: FYI only for provider.  Patient was last seen in primary care on 05/11/2024 by Gladis Mustard, FNP.  Called Nurse Triage reporting Medication Refill.  Symptoms began about a month ago.  Interventions attempted: Prescription medications: methocarbamol , diclofenac  and Ice/heat application.  Symptoms are: lower back pain and goes across both hips, neck pain (resolved at this time) gradually worsening.  Triage Disposition: See PCP When Office is Open (Within 3 Days)  Patient/caregiver understands and will follow disposition?:  Yes              Copied from CRM #[id number]. Topic: Clinical - Medication Question >> Jun 07, 2024  1:39 PM Antwanette L wrote: Reason for CRM: Patient has an appt on 9/12 with Dr. Jolinda to discuss her back pain. Patient wants to know if Dr. Jolinda can prescribe Methocarbamol ? The patient can be contacted at [phone] >> Jun 07, 2024  1:42 PM Antwanette L wrote: The medicine is not listed on the pt chart >> Jun 07, 2024  1:42 PM Antwanette L wrote: The pt currently has no symptoms Reason for Disposition  Prescription request for new medicine (not a refill)  Answer Assessment - Initial Assessment Questions Patient is requesting if Dr Jolinda will prescribed methocarbamol . She states she takes it as needed for pain.  1. DRUG NAME: What medicine do you need to have refilled?     Methocarbamol .  2. REFILLS REMAINING: How many refills are remaining? Notes: The label on the medicine or pill bottle will show how many refills are remaining. If there are no refills remaining, then a renewal may be needed.     0.  3. EXPIRATION DATE: What is the expiration date? Note: The label states when the prescription will expire, and thus can no longer be refilled.)     N/A.  4. PRESCRIBER: Who prescribed it? Note: The prescribing doctor or group is responsible for refill approvals..     Patient states she  thinks it was prescribed at urgent care back in June.  5. PHARMACY: Have you contacted your pharmacy (drugstore)? Note: Some pharmacies will contact the doctor (or NP/PA).      N/A.  6. SYMPTOMS: Do you have any symptoms?     Constant at times it is 10+/10 lower back pain across both hips x 1 month; neck pain subsided at this time. She states she has been doing a lot of heavy lifting with her chickens and kittens. Patient denies numbness or weakness, loss of bowel or bladder control.  7. PREGNANCY: Is there any chance that you are pregnant? When was your last menstrual period?     N/A.  Protocols used: Medication Refill and Renewal Call-A-AH

## 2024-06-09 ENCOUNTER — Encounter: Payer: Self-pay | Admitting: Family Medicine

## 2024-06-09 ENCOUNTER — Other Ambulatory Visit: Payer: Self-pay | Admitting: Family Medicine

## 2024-06-09 ENCOUNTER — Ambulatory Visit (INDEPENDENT_AMBULATORY_CARE_PROVIDER_SITE_OTHER)

## 2024-06-09 ENCOUNTER — Ambulatory Visit (INDEPENDENT_AMBULATORY_CARE_PROVIDER_SITE_OTHER): Admitting: Family Medicine

## 2024-06-09 VITALS — BP 114/74 | HR 66 | Temp 97.5°F | Ht 66.0 in | Wt 173.1 lb

## 2024-06-09 DIAGNOSIS — M545 Low back pain, unspecified: Secondary | ICD-10-CM

## 2024-06-09 DIAGNOSIS — G8929 Other chronic pain: Secondary | ICD-10-CM | POA: Diagnosis not present

## 2024-06-09 DIAGNOSIS — Z780 Asymptomatic menopausal state: Secondary | ICD-10-CM | POA: Diagnosis not present

## 2024-06-09 MED ORDER — METHOCARBAMOL 500 MG PO TABS: 500.0000 mg | ORAL_TABLET | Freq: Three times a day (TID) | ORAL | 3 refills | Status: AC | PRN

## 2024-06-09 NOTE — Progress Notes (Signed)
 Subjective: CC: Chronic low back pain PCP: Jolinda Norene HERO, DO YEP:[id number] Jenna Smith is Jenna 73 y.o. female presenting to clinic today for:  1.  Chronic low back pain Patient has seen Dr. Bonner for evaluation of this and takes diclofenac  75 mg up to twice daily as needed.  She is asking for refill on her methocarbamol  which has been helpful in combination with the diclofenac  in the past.  She uses this maybe twice per week.  She denies any excessive daytime sedation.  Continues to stay physically active working her chickens and taking care of her other animals.  She reports no weakness, falls or sensory changes in the legs.   ROS: Per HPI  Allergies  Allergen Reactions   Bactrim [Sulfamethoxazole-Trimethoprim] Shortness Of Breath   Bee Venom Anaphylaxis   Indomethacin Swelling    Face swells up   Avocado Hives   Bacitracin    Latex Hives   Neosporin [Neomycin-Bacitracin Zn-Polymyx] Hives   Other     Numerous food allergies: Broccoli, buckwheat, cabbage, cantaloupe, cinnamon, corn, polyps, lumen, molds, mushroom, black pepper, green pepper, pineapple, sweet potato, spinach, tomato, whole-wheat, whole egg, [place] meat, cow milk, crab, lobster, salmon, shellfish mix, coconut, black walnut.  *Patient states she continues to eat essentially all of these items.   Penicillins Hives    Has patient had Jenna PCN reaction causing immediate rash, facial/tongue/throat swelling, SOB or lightheadedness with hypotension: no Has patient had Jenna PCN reaction causing severe rash involving mucus membranes or skin necrosis: no Has patient had Jenna PCN reaction that required hospitalization: no Has patient had Jenna PCN reaction occurring within the last 10 years: no If all of the above answers are NO, then may proceed with Cephalosporin use.    Polymyxin B     Pomegranate (Punica Granatum) Hives   Past Medical History:  Diagnosis Date   Allergy     multiple food and environmental allergies   Anemia  12/22/2017   Arthritis     Current Outpatient Medications:    acetaminophen  (TYLENOL ) 650 MG CR tablet, Take 650 mg by mouth every 8 (eight) hours as needed for pain., Disp: , Rfl:    amitriptyline  (ELAVIL ) 10 MG tablet, Take 1-2 tablets (10-20 mg total) by mouth at bedtime. For sleep, headache, facial pain, chronic pain, Disp: 180 tablet, Rfl: 0   Calcium Carbonate-Vit D-Min (CALCIUM 1200) 1200-1000 MG-UNIT CHEW, Chew 2 tablets by mouth daily., Disp: , Rfl:    EPINEPHrine  0.3 mg/0.3 mL IJ SOAJ injection, Inject 0.3 mg into the muscle as needed for anaphylaxis., Disp: 1 each, Rfl: 3   Liver Extract (LIVER PO), Take 4,500 mg by mouth daily., Disp: , Rfl:    mupirocin  ointment (BACTROBAN ) 2 %, Apply to the affected area twice daily x5 days, Disp: 22 g, Rfl: 0   Omega-3 Fatty Acids (FISH OIL) 1000 MG CAPS, Take 2,000 mg by mouth in the morning, at noon, and at bedtime. Relief Factor, Disp: , Rfl:    triamcinolone  ointment (KENALOG ) 0.5 %, Apply 1 Application topically 2 (two) times daily., Disp: 30 g, Rfl: 0   Turmeric (QC TUMERIC COMPLEX PO), Take 1,000 mcg by mouth 2 (two) times daily., Disp: , Rfl:  Social History   Socioeconomic History   Marital status: Married    Spouse name: Marcey   Number of children: 1   Years of education: 13   Highest education level: Some college, no degree  Occupational History   Occupation: retired  Tobacco Use  Smoking status: Former    Current packs/day: 0.00    Average packs/day: 0.3 packs/day for 15.0 years (4.5 ttl pk-yrs)    Types: Cigarettes    Start date: 10/28/1969    Quit date: 10/28/1984    Years since quitting: 39.6   Smokeless tobacco: Never   Tobacco comments:    smoked off and on x 15 yrs  Vaping Use   Vaping status: Never Used  Substance and Sexual Activity   Alcohol use: Not Currently    Comment: Holidays - occasional wine   Drug use: No   Sexual activity: Yes    Birth control/protection: Post-menopausal  Other Topics Concern    Not on file  Social History Narrative   Not on file   Social Drivers of Health   Financial Resource Strain: Low Risk  (04/14/2024)   Overall Financial Resource Strain (CARDIA)    Difficulty of Paying Living Expenses: Not hard at all  Food Insecurity: No Food Insecurity (04/14/2024)   Hunger Vital Sign    Worried About Running Out of Food in the Last Year: Never true    Ran Out of Food in the Last Year: Never true  Transportation Needs: No Transportation Needs (04/14/2024)   PRAPARE - Administrator, Civil Service (Medical): No    Lack of Transportation (Non-Medical): No  Physical Activity: Unknown (04/14/2024)   Exercise Vital Sign    Days of Exercise per Week: Patient declined    Minutes of Exercise per Session: Not on file  Stress: No Stress Concern Present (04/14/2024)   [employer] of Occupational Health - Occupational Stress Questionnaire    Feeling of Stress: Not at all  Social Connections: Unknown (04/14/2024)   Social Connection and Isolation Panel    Frequency of Communication with Friends and Family: More than three times Jenna week    Frequency of Social Gatherings with Friends and Family: More than three times Jenna week    Attends Religious Services: Not on Insurance claims handler of Clubs or Organizations: No    Attends Banker Meetings: Not on file    Marital Status: Married  Intimate Partner Violence: Not At Risk (07/31/2021)   Humiliation, Afraid, Rape, and Kick questionnaire    Fear of Current or Ex-Partner: No    Emotionally Abused: No    Physically Abused: No    Sexually Abused: No   Family History  Problem Relation Age of Onset   Aneurysm Mother        passed away at age 52   Cancer Father        passed away after Prostate cancer metastesize   Cancer Sister        she passed away 74 due to Breast Cancer   Stroke Sister    Aneurysm Sister    Colon cancer Neg Hx    Breast cancer Neg Hx     Objective: Office vital signs  reviewed. BP 114/74   Pulse 66   Temp (!) 97.5 F (36.4 C)   Ht 5' 6 (1.676 m)   Wt 173 lb 2 oz (78.5 kg)   SpO2 96%   BMI 27.94 kg/m   Physical Examination:  General: Awake, alert, well nourished, No acute distress MSK: normal gait and station.  Normal tone Neuro: Light touch sensation grossly intact  Assessment/ Plan: 73 y.o. female   Chronic right-sided low back pain without sciatica - Plan: methocarbamol  (ROBAXIN ) 500 MG tablet  Robaxin  renewed.  Continue  diclofenac  as needed.  Follow-up as needed with spinal specialist   Norene CHRISTELLA Fielding, DO [facility] [phone]

## 2024-06-11 DIAGNOSIS — M8589 Other specified disorders of bone density and structure, multiple sites: Secondary | ICD-10-CM | POA: Diagnosis not present

## 2024-06-11 DIAGNOSIS — Z780 Asymptomatic menopausal state: Secondary | ICD-10-CM | POA: Diagnosis not present

## 2024-06-14 ENCOUNTER — Ambulatory Visit: Payer: Self-pay | Admitting: Family Medicine

## 2024-06-14 DIAGNOSIS — M8580 Other specified disorders of bone density and structure, unspecified site: Secondary | ICD-10-CM | POA: Insufficient documentation

## 2024-06-14 DIAGNOSIS — E669 Obesity, unspecified: Secondary | ICD-10-CM

## 2024-06-15 ENCOUNTER — Telehealth: Payer: Self-pay

## 2024-06-15 DIAGNOSIS — M8580 Other specified disorders of bone density and structure, unspecified site: Secondary | ICD-10-CM

## 2024-06-15 MED ORDER — DENOSUMAB 60 MG/ML ~~LOC~~ SOSY: 60.0000 mg | PREFILLED_SYRINGE | SUBCUTANEOUS | Status: DC

## 2024-06-15 MED ORDER — DENOSUMAB 60 MG/ML ~~LOC~~ SOSY: 60.0000 mg | PREFILLED_SYRINGE | Freq: Once | SUBCUTANEOUS | Status: DC

## 2024-06-15 NOTE — Telephone Encounter (Signed)
 Please check with patient's insurance to see if Prolia  is covered.  Pools, can you please get her set up with a lab appointment for fasting labs and vitamin ED testing etc.  Orders are placed  Orders Placed This Encounter  Procedures   CMP14+EGFR   PTH, Intact and Calcium   VITAMIN D  25 Hydroxy (Vit-D Deficiency, Fractures)   Lipid panel

## 2024-06-15 NOTE — Telephone Encounter (Signed)
 Prolia  sent for benefit verification

## 2024-06-16 ENCOUNTER — Telehealth: Payer: Self-pay

## 2024-06-16 DIAGNOSIS — M8580 Other specified disorders of bone density and structure, unspecified site: Secondary | ICD-10-CM

## 2024-06-16 NOTE — Telephone Encounter (Signed)
 Prolia  VOB initiated via [email or web address]  Next Prolia  inj DUE: NEW START

## 2024-06-18 ENCOUNTER — Other Ambulatory Visit: Payer: PRIVATE HEALTH INSURANCE

## 2024-06-18 ENCOUNTER — Other Ambulatory Visit (HOSPITAL_COMMUNITY): Payer: Self-pay

## 2024-06-18 DIAGNOSIS — M8580 Other specified disorders of bone density and structure, unspecified site: Secondary | ICD-10-CM

## 2024-06-18 DIAGNOSIS — E669 Obesity, unspecified: Secondary | ICD-10-CM

## 2024-06-18 NOTE — Telephone Encounter (Signed)
 SABRA

## 2024-06-18 NOTE — Telephone Encounter (Signed)
 Patient does not meet the requirements for medical PA (patient must fail BOTH an oral and IV bisphosphate). Patient can get Prolia  through pharmacy benefit. Completed referral for pharmacy benefit.    Pt ready for scheduling for PROLIA  on or after : 06/18/24  Option# 2- Med Obtained from pharmacy:  Pharmacy benefit: Copay $521.11 (Paid to pharmacy) Admin Fee: 0% (Pay at clinic)  Prior Auth: N/A PA# Expiration Date:   # of doses approved:   If patient wants fill through the pharmacy benefit please send prescription to: [facility], and include estimated need by date in rx notes. Pharmacy will ship medication directly to the office.  Patient NOT eligible for Prolia  Copay Card. Copay Card can make patient's cost as little as $25. Link to apply: https://www.amgensupportplus.com/copay  ** This summary of benefits is an estimation of the patient's out-of-pocket cost. Exact cost may very based on individual plan coverage.

## 2024-06-20 LAB — LIPID PANEL
Chol/HDL Ratio: 3.2 ratio (ref 0.0–4.4)
Cholesterol, Total: 252 mg/dL — ABNORMAL HIGH (ref 100–199)
HDL: 80 mg/dL (ref >39)
LDL Chol Calc (NIH): 162 mg/dL — ABNORMAL HIGH (ref 0–99)
Triglycerides: 60 mg/dL (ref 0–149)
VLDL Cholesterol Cal: 10 mg/dL (ref 5–40)

## 2024-06-20 LAB — VITAMIN D 25 HYDROXY (VIT D DEFICIENCY, FRACTURES): Vit D, 25-Hydroxy: 51 ng/mL (ref 30.0–100.0)

## 2024-06-20 LAB — CMP14+EGFR
ALT: 21 IU/L (ref 0–32)
AST: 17 IU/L (ref 0–40)
Albumin: 4 g/dL (ref 3.8–4.8)
Alkaline Phosphatase: 102 IU/L (ref 44–121)
BUN/Creatinine Ratio: 28 (ref 12–28)
BUN: 25 mg/dL (ref 8–27)
Bilirubin Total: 0.3 mg/dL (ref 0.0–1.2)
CO2: 24 mmol/L (ref 20–29)
Calcium: 9.4 mg/dL (ref 8.7–10.3)
Chloride: 106 mmol/L (ref 96–106)
Creatinine, Ser: 0.88 mg/dL (ref 0.57–1.00)
Globulin, Total: 2.4 g/dL (ref 1.5–4.5)
Glucose: 86 mg/dL (ref 70–99)
Potassium: 4.8 mmol/L (ref 3.5–5.2)
Sodium: 144 mmol/L (ref 134–144)
Total Protein: 6.4 g/dL (ref 6.0–8.5)
eGFR: 70 mL/min/1.73 (ref >59)

## 2024-06-20 LAB — PTH, INTACT AND CALCIUM: PTH: 43 pg/mL (ref 15–65)

## 2024-06-21 ENCOUNTER — Ambulatory Visit: Payer: Self-pay | Admitting: Family Medicine

## 2024-06-21 ENCOUNTER — Other Ambulatory Visit: Payer: Self-pay | Admitting: Family Medicine

## 2024-06-21 DIAGNOSIS — E7800 Pure hypercholesterolemia, unspecified: Secondary | ICD-10-CM

## 2024-06-21 MED ORDER — ROSUVASTATIN CALCIUM 10 MG PO TABS: 10.0000 mg | ORAL_TABLET | Freq: Every day | ORAL | 3 refills | Status: AC

## 2024-06-21 NOTE — Telephone Encounter (Signed)
 Can you make sure she knows about cost of this shot. I suspect she'll prefer pills

## 2024-06-22 NOTE — Telephone Encounter (Signed)
 Cost is to high for the patient - I placed referral for pharmacy follow up with Mliss per patient request.

## 2024-06-22 NOTE — Addendum Note (Signed)
 Addended by: BRANDY SETTER D on: 06/22/2024 04:58 PM   Modules accepted: Orders

## 2024-06-22 NOTE — Telephone Encounter (Signed)
 Spoke to patient she would like an appt with Julie to discuss meds before starting anything.  Referral placed

## 2024-06-22 NOTE — Telephone Encounter (Signed)
 Spoke to patient - she would like appt with Jenna Smith to discuss medications before starting

## 2024-06-24 ENCOUNTER — Telehealth: Payer: Self-pay

## 2024-06-24 NOTE — Progress Notes (Signed)
 Care Guide Pharmacy Note  06/24/2024 Name: Jenna Smith MRN: [id number] DOB: 1951/06/20  Referred By: Jolinda Norene HERO, DO Reason for referral: Complex Care Management (Outreach to schedule with Pharm d )   Jenna Smith is a 72 y.o. year old female who is a primary care patient of Jolinda Norene M, DO.  Jenna Smith was referred to the pharmacist for assistance related to: osteoporosis   Successful contact was made with the patient to discuss pharmacy services including being ready for the pharmacist to call at least 5 minutes before the scheduled appointment time and to have medication bottles and any blood pressure readings ready for review. The patient agreed to meet with the pharmacist via face to face  on (date/time).07/15/2024  Jeoffrey Buffalo , RMA     [place]  [facility] Guide  Direct Dial: [phone]  Website: [email or web address]

## 2024-07-06 ENCOUNTER — Ambulatory Visit (INDEPENDENT_AMBULATORY_CARE_PROVIDER_SITE_OTHER): Payer: PRIVATE HEALTH INSURANCE | Admitting: Family Medicine

## 2024-07-06 ENCOUNTER — Encounter: Payer: Self-pay | Admitting: Family Medicine

## 2024-07-06 VITALS — BP 109/73 | HR 66 | Temp 97.4°F | Ht 66.0 in | Wt 172.5 lb

## 2024-07-06 DIAGNOSIS — R42 Dizziness and giddiness: Secondary | ICD-10-CM | POA: Diagnosis not present

## 2024-07-06 DIAGNOSIS — Z2821 Immunization not carried out because of patient refusal: Secondary | ICD-10-CM

## 2024-07-06 MED ORDER — MECLIZINE HCL 12.5 MG PO TABS: 12.5000 mg | ORAL_TABLET | Freq: Three times a day (TID) | ORAL | 0 refills | Status: DC | PRN
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
## 2024-07-07 ENCOUNTER — Ambulatory Visit: Payer: Self-pay | Admitting: Family Medicine

## 2024-07-07 LAB — VITAMIN B12: Vitamin B-12: 1057 pg/mL (ref 232–1245)

## 2024-07-09 ENCOUNTER — Ambulatory Visit: Payer: PRIVATE HEALTH INSURANCE | Admitting: Family Medicine

## 2024-07-13 ENCOUNTER — Telehealth: Payer: Self-pay

## 2024-07-13 NOTE — Telephone Encounter (Signed)
 I've messaged Ms Vergia to coordinate Ms Takeshita's appointment.  Her notes are all done and ready for forwarding to the specialist.  Will cc Courtney as well to make sure no more barriers to scheduling.

## 2024-07-13 NOTE — Telephone Encounter (Signed)
 Copied from CRM [id number]. Topic: Referral - Status >> Jul 13, 2024 10:50 AM Travis F wrote: Reason for CRM: Patient is calling in because she was told by Dr. Jolinda if she hadn't heard from Neurology to give Dr. KANDICE a call. Patient says it has been over a week and she hasn't heard anything.

## 2024-07-14 ENCOUNTER — Encounter: Payer: Self-pay | Admitting: Neurology

## 2024-07-15 ENCOUNTER — Encounter: Payer: Self-pay | Admitting: Pharmacist

## 2024-07-15 ENCOUNTER — Ambulatory Visit (INDEPENDENT_AMBULATORY_CARE_PROVIDER_SITE_OTHER): Payer: PRIVATE HEALTH INSURANCE | Admitting: Pharmacist

## 2024-07-15 DIAGNOSIS — M818 Other osteoporosis without current pathological fracture: Secondary | ICD-10-CM | POA: Diagnosis not present

## 2024-07-15 NOTE — Telephone Encounter (Signed)
 Patient has been scheduled with [facility] at this time :)
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
## 2024-08-19 DIAGNOSIS — M5416 Radiculopathy, lumbar region: Secondary | ICD-10-CM | POA: Diagnosis not present

## 2024-08-30 ENCOUNTER — Encounter: Payer: Self-pay | Admitting: Neurology

## 2024-08-30 ENCOUNTER — Ambulatory Visit (INDEPENDENT_AMBULATORY_CARE_PROVIDER_SITE_OTHER): Admitting: Neurology

## 2024-08-30 VITALS — BP 116/68 | HR 81 | Ht 66.0 in | Wt 174.0 lb

## 2024-08-30 DIAGNOSIS — R42 Dizziness and giddiness: Secondary | ICD-10-CM | POA: Diagnosis not present

## 2024-08-30 NOTE — Patient Instructions (Signed)
 Ultrasound carotid arteries

## 2024-08-30 NOTE — Progress Notes (Signed)
 [facility] Clinic Note - Initial Visit   Date: 08/30/2024   SURA CANUL MRN: [id number] DOB: 08/06/1951   Dear Dr. Jolinda:  Thank you for your kind referral of Charice A Lilley for consultation of lightheadedness. Although her history is well known to you, please allow us  to reiterate it for the purpose of our medical record. The patient was accompanied to the clinic by self.    Dominga A Graven is a 73 y.o. right-handed female with hyperlipidemia and OA presenting for evaluation of lightheadedness.   IMPRESSION/PLAN: Lightheadedness triggered by neck movement.  Neurological exam is normal.  There is no intracranial stenosis on MRA head, specifically, no vertebrobasilar stenosis.  I recommend checking ultrasound carotids to look for carotid and proximal vertebral stenosis.  Fortunately, symptoms have improved after starting Crestor  10mg  daily.    Further recommendations pending results.  ------------------------------------------------------------- History of present illness: Starting around the summer of 2025, she began noticing that she would get lightheaded when she reaches up or extends her neck.  Symptoms would last about 30-seconds and resolve with neck flexion and repositioning.  She denies associated spinning sensation, nausea, or vomiting.  No numbness/tingling or weakness.  She was started on crestor  10mg  a few weeks ago and feels that this has helped because she no longer has these spells.  She does not take daily aspirin .  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  She lives at home with husband.  Nonsmoker and does not drink alcohol.   Out-side paper records, electronic medical record, and images have been reviewed where available and summarized as:  MRA head wo contrast 11/30/2023: No aneurysm. Increased narrowing at the right ACA origin is probably artifactual.  CT head 08/07/2022: No evidence  of acute intracranial abnormality.    Lab Results  Component Value Date   VITAMINB12 1,057 07/06/2024   Lab Results  Component Value Date   TSH 2.330 03/10/2023    Past Medical History:  Diagnosis Date   Allergy     multiple food and environmental allergies   Anemia 12/22/2017   Arthritis     Past Surgical History:  Procedure Laterality Date   BREAST SURGERY Bilateral 02/2000   Breast reduction   CATARACT EXTRACTION W/PHACO Right 04/05/2024   Procedure: PHACOEMULSIFICATION, CATARACT, WITH IOL INSERTION;  Surgeon: Harrie Agent, MD;  Location: [facility];  Service: Ophthalmology;  Laterality: Right;  CDE: 14.01   CATARACT EXTRACTION W/PHACO Left 04/19/2024   Procedure: PHACOEMULSIFICATION, CATARACT, WITH IOL INSERTION;  Surgeon: Harrie Agent, MD;  Location: [facility];  Service: Ophthalmology;  Laterality: Left;  CDE:12.90   COLONOSCOPY WITH PROPOFOL  N/A 05/29/2021   Surgeon: Cindie Dunnings K, DO; Nonbleeding internal hemorrhoids, 10 mm polyp in the cecum.  Pathology revealed tubular adenoma.  Recommended repeat colonoscopy in 3 years.   COSMETIC SURGERY  06/2011   Face lift   ESOPHAGOGASTRODUODENOSCOPY (EGD) WITH PROPOFOL  N/A 05/29/2021   Surgeon: Cindie Dunnings POUR, DO; Gastritis biopsied, normal esophagus and duodenum. Pathology with reactive gastropathy, negative for H. pylori.   INSERTION, STENT, DRUG-ELUTING, LACRIMAL CANALICULUS Right 04/05/2024   Procedure: INSERTION, STENT, DRUG-ELUTING, LACRIMAL CANALICULUS;  Surgeon: Harrie Agent, MD;  Location: [facility];  Service: Ophthalmology;  Laterality: Right;   INSERTION, STENT, DRUG-ELUTING, LACRIMAL CANALICULUS Left 04/19/2024   Procedure: INSERTION, STENT, DRUG-ELUTING, LACRIMAL CANALICULUS;  Surgeon: Harrie Agent, MD;  Location: [facility];  Service: Ophthalmology;  Laterality: Left;   REDUCTION MAMMAPLASTY     TONSILLECTOMY     TOTAL  KNEE ARTHROPLASTY Right 11/19/2017   Procedure: RIGHT TOTAL KNEE ARTHROPLASTY;  Surgeon: Heide Ingle, MD;  Location: [facility];  Service: Orthopedics;  Laterality: Right;     Medications:  Outpatient Encounter Medications as of 08/30/2024  Medication Sig   acetaminophen  (TYLENOL ) 650 MG CR tablet Take 650 mg by mouth every 8 (eight) hours as needed for pain.   Calcium  Carbonate-Vit D-Min (CALCIUM  1200) 1200-1000 MG-UNIT CHEW Chew 2 tablets by mouth daily.   diclofenac  (VOLTAREN ) 75 MG EC tablet Take 75 mg by mouth 2 (two) times daily.   Liver Extract (LIVER PO) Take 4,500 mg by mouth daily.   methocarbamol  (ROBAXIN ) 500 MG tablet Take 1 tablet (500 mg total) by mouth every 8 (eight) hours as needed for muscle spasms.   Omega-3 Fatty Acids (FISH OIL) 1000 MG CAPS Take 2,000 mg by mouth in the morning, at noon, and at bedtime. Relief Factor   rosuvastatin  (CRESTOR ) 10 MG tablet Take 1 tablet (10 mg total) by mouth daily.   Turmeric (QC TUMERIC COMPLEX PO) Take 1,000 mcg by mouth 2 (two) times daily.   meclizine  (ANTIVERT ) 12.5 MG tablet Take 1 tablet (12.5 mg total) by mouth 3 (three) times daily as needed for dizziness. (Patient not taking: Reported on 08/30/2024)   No facility-administered encounter medications on file as of 08/30/2024.    Allergies:  Allergies  Allergen Reactions   Bactrim [Sulfamethoxazole-Trimethoprim] Shortness Of Breath   Bee Venom Anaphylaxis   Indomethacin Swelling    Face swells up   Avocado Hives   Bacitracin    Latex Hives   Neosporin [Neomycin-Bacitracin Zn-Polymyx] Hives   Other     Numerous food allergies: Broccoli, buckwheat, cabbage, cantaloupe, cinnamon, corn, polyps, lumen, molds, mushroom, black pepper, green pepper, pineapple, sweet potato, spinach, tomato, whole-wheat, whole egg, turkey meat, cow milk, crab, lobster, salmon, shellfish mix, coconut, black walnut.  *Patient states she continues to eat essentially all of these items.   Penicillins Hives    Has patient had a PCN reaction causing immediate rash, facial/tongue/throat swelling, SOB  or lightheadedness with hypotension: no Has patient had a PCN reaction causing severe rash involving mucus membranes or skin necrosis: no Has patient had a PCN reaction that required hospitalization: no Has patient had a PCN reaction occurring within the last 10 years: no If all of the above answers are NO, then may proceed with Cephalosporin use.    Polymyxin B     Pomegranate (Punica Granatum) Hives    Family History: Family History  Problem Relation Age of Onset   Aneurysm Mother        passed away at age 18   Cancer Father        passed away after Prostate cancer metastesize   Cancer Sister        she passed away 34 due to Breast Cancer   Stroke Sister    Aneurysm Sister    Colon cancer Neg Hx    Breast cancer Neg Hx     Social History: Social History   Tobacco Use   Smoking status: Former    Current packs/day: 0.00    Average packs/day: 0.3 packs/day for 15.0 years (4.5 ttl pk-yrs)    Types: Cigarettes    Start date: 10/28/1969    Quit date: 10/28/1984    Years since quitting: 39.8   Smokeless tobacco: Never   Tobacco comments:    smoked off and on x 15 yrs  Vaping Use   Vaping status: Never Used  Substance Use Topics   Alcohol use: Not Currently    Comment: Holidays - occasional wine   Drug use: No   Social History   Social History Narrative   Are you right handed or left handed? Right Handed   Are you currently employed ? No    What is your current occupation? Retired    Do you live at home alone? No    Who lives with you? Husband    What type of home do you live in: 1 story or 2 story? Lives in a one story home        Vital Signs:  BP 116/68   Pulse 81   Ht 5' 6 (1.676 m)   Wt 174 lb (78.9 kg)   SpO2 97%   BMI 28.08 kg/m    Neurological Exam: MENTAL STATUS including orientation to time, place, person, recent and remote memory, attention span and concentration, language, and fund of knowledge is normal.  Speech is not dysarthric.  CRANIAL  NERVES: II:  No visual field defects.     III-IV-VI: Pupils equal round and reactive to light.  Normal conjugate, extra-ocular eye movements in all directions of gaze.  No nystagmus.  No ptosis.   V:  Normal facial sensation.    VII:  Normal facial symmetry and movements.   VIII:  Normal hearing and vestibular function.   IX-X:  Normal palatal movement.   XI:  Normal shoulder shrug and head rotation.   XII:  Normal tongue strength and range of motion, no deviation or fasciculation.  MOTOR:  Motor strength is 5/5 throughout.  No atrophy, fasciculations or abnormal movements.  No pronator drift.  MSRs:                                           Right        Left brachioradialis 2+  2+  biceps 2+  2+  triceps 2+  2+  patellar 2+  2+  ankle jerk 2+  2+  plantar response down  down   SENSORY:  Normal and symmetric perception of light touch, temperature, and vibration.   COORDINATION/GAIT: Normal finger-to- nose-finger.  Intact rapid alternating movements bilaterally.  Gait narrow based and stable. Tandem and stressed gait intact.     Thank you for allowing me to participate in patient's care.  If I can answer any additional questions, I would be pleased to do so.    Sincerely,    Terald Jump K. Tobie, DO

## 2024-09-01 ENCOUNTER — Encounter: Payer: Self-pay | Admitting: Physical Therapy

## 2024-09-01 ENCOUNTER — Ambulatory Visit: Attending: Orthopedic Surgery | Admitting: Physical Therapy

## 2024-09-01 ENCOUNTER — Other Ambulatory Visit: Payer: Self-pay

## 2024-09-01 DIAGNOSIS — M25612 Stiffness of left shoulder, not elsewhere classified: Secondary | ICD-10-CM | POA: Diagnosis present

## 2024-09-01 DIAGNOSIS — M25512 Pain in left shoulder: Secondary | ICD-10-CM | POA: Insufficient documentation

## 2024-09-01 DIAGNOSIS — R42 Dizziness and giddiness: Secondary | ICD-10-CM | POA: Insufficient documentation

## 2024-09-01 DIAGNOSIS — G8929 Other chronic pain: Secondary | ICD-10-CM | POA: Insufficient documentation

## 2024-09-01 DIAGNOSIS — M6281 Muscle weakness (generalized): Secondary | ICD-10-CM | POA: Insufficient documentation

## 2024-09-01 NOTE — Therapy (Signed)
 OUTPATIENT PHYSICAL THERAPY SHOULDER EVALUATION   Patient Name: Jenna Smith MRN: [id number] DOB:Sep 21, 1951, 73 y.o., female Today's Date: 09/01/2024  END OF SESSION:  PT End of Session - 09/01/24 0922     Visit Number 1    Number of Visits 12    Date for Recertification  10/13/24    Authorization Type [employer] Medicare    PT Start Time 0930    PT Stop Time 1010    PT Time Calculation (min) 40 min          Past Medical History:  Diagnosis Date   Allergy     multiple food and environmental allergies   Anemia 12/22/2017   Arthritis    Past Surgical History:  Procedure Laterality Date   BREAST SURGERY Bilateral 02/2000   Breast reduction   CATARACT EXTRACTION W/PHACO Right 04/05/2024   Procedure: PHACOEMULSIFICATION, CATARACT, WITH IOL INSERTION;  Surgeon: Harrie Agent, MD;  Location: [facility];  Service: Ophthalmology;  Laterality: Right;  CDE: 14.01   CATARACT EXTRACTION W/PHACO Left 04/19/2024   Procedure: PHACOEMULSIFICATION, CATARACT, WITH IOL INSERTION;  Surgeon: Harrie Agent, MD;  Location: [facility];  Service: Ophthalmology;  Laterality: Left;  CDE:12.90   COLONOSCOPY WITH PROPOFOL  N/A 05/29/2021   Surgeon: Cindie Dunnings K, DO; Nonbleeding internal hemorrhoids, 10 mm polyp in the cecum.  Pathology revealed tubular adenoma.  Recommended repeat colonoscopy in 3 years.   COSMETIC SURGERY  06/2011   Face lift   ESOPHAGOGASTRODUODENOSCOPY (EGD) WITH PROPOFOL  N/A 05/29/2021   Surgeon: Cindie Dunnings POUR, DO; Gastritis biopsied, normal esophagus and duodenum. Pathology with reactive gastropathy, negative for H. pylori.   INSERTION, STENT, DRUG-ELUTING, LACRIMAL CANALICULUS Right 04/05/2024   Procedure: INSERTION, STENT, DRUG-ELUTING, LACRIMAL CANALICULUS;  Surgeon: Harrie Agent, MD;  Location: [facility];  Service: Ophthalmology;  Laterality: Right;   INSERTION, STENT, DRUG-ELUTING, LACRIMAL CANALICULUS Left 04/19/2024   Procedure: INSERTION, STENT, DRUG-ELUTING, LACRIMAL  CANALICULUS;  Surgeon: Harrie Agent, MD;  Location: [facility];  Service: Ophthalmology;  Laterality: Left;   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   Procedure: RIGHT TOTAL KNEE ARTHROPLASTY;  Surgeon: Heide Ingle, MD;  Location: [facility];  Service: Orthopedics;  Laterality: Right;   Patient Active Problem List   Diagnosis Date Noted   Osteopenia with high risk of fracture 06/14/2024   Symptomatic mammary hypertrophy 10/03/2021   Back pain 10/03/2021   Neck pain 10/03/2021   Diarrhea 05/07/2021   Abdominal pain, epigastric 05/07/2021   Colon cancer screening 05/07/2021   Dizziness 03/02/2020   TBI (traumatic brain injury) (HCC) 03/02/2020   Traumatic brain injury (HCC) 01/07/2018   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   Syncope 12/22/2017   Hypokalemia 12/22/2017   Anemia 12/22/2017   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 Pt reports she's using her shoulder but is not interested in surgery. Pt states she's able to manage with compensations. Did get cortisone shot and is now getting pain in her L knee - trying to follow up with doctor about this. Pt states she is not sure why she is coming to  PT. Will be seeing ortho next month. Pt states she is doing everything as needed at home including lifting 40 lb bags of litter.  Hand dominance: Left  PERTINENT HISTORY: History of a TBI, osteoarthritis, allergies, and vertigo  PAIN:  Are you having pain? Yes: NPRS scale: 0 pain at rest, at most 4 or 5 at most but brief Pain location: side of L arm Pain description: doesn't linger Aggravating factors: Reaching behind back Relieving factors: tylenol   PRECAUTIONS: None  RED FLAGS: None   WEIGHT BEARING RESTRICTIONS: No  FALLS:  Has patient fallen in last 6 months? 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  DIAGNOSTIC FINDINGS:  Nothing new seen on Epic for her shoulder  PATIENT SURVEYS:  Quick Dash:  QUICK DASH  Please rate your ability do the following activities in the last week by selecting the number below the appropriate response.   Activities Rating  Open a tight or new jar.  1 = No difficulty   Do heavy household chores (e.g., wash walls, floors). 2 = Mild difficulty  Carry a shopping bag or briefcase 1 = No difficulty   Wash your back. 3 = Moderate difficulty  Use a knife to cut food. 1 = No difficulty   Recreational activities in which you take some force or impact through your arm, shoulder or hand (e.g., golf, hammering, tennis, etc.). 2 = Mild difficulty  During the past week, to what extent has your arm, shoulder or hand problem interfered with your normal social activities with family, friends, neighbors or groups?  2 = Slightly  During the past week, were you limited in your work or other regular daily  activities as a result of your arm, shoulder or hand problem? 2 = Slightly limited  Rate the severity of the following symptoms in the last week: Arm, Shoulder, or hand pain. 3 = Moderate  Rate the severity of the following symptoms in the last week: Tingling (pins and needles) in your arm, shoulder or hand. 1 = none  During the past week, how much difficulty have you had sleeping because of the pain in your arm, shoulder or hand?  2 = Mild difficulty  Score 20.5/100   (A QuickDASH score may not be calculated if there is greater than 1 missing item.)   Minimally Clinically Important Difference (MCID): 15-20 points  (Franchignoni, F. et al. (2013). Minimally clinically important difference of the disabilities of the arm, shoulder, and hand outcome measures (DASH) and its shortened version (Quick DASH). Journal of Orthopaedic & Sports Physical Therapy, 44(1), 30-39)   COGNITION: Overall cognitive status: Within functional limits for tasks assessed     SENSATION: WFL  POSTURE: Rounded shoulders  UPPER EXTREMITY ROM:   Active ROM Right eval Left eval  Shoulder flexion 140 130  Shoulder extension 65 60  Shoulder abduction 150 150  Shoulder adduction    Shoulder internal rotation  30  Shoulder external rotation  25  Elbow flexion    Elbow extension    Wrist flexion    Wrist extension    Wrist ulnar deviation    Wrist radial deviation    Wrist pronation    Wrist supination    (Blank rows = not tested)  UPPER EXTREMITY MMT:  MMT Right eval Left eval  Shoulder flexion 5 5  Shoulder extension  3+  Shoulder abduction 5 5  Shoulder adduction    Shoulder internal rotation 5 5  Shoulder external rotation 5  5 within available range  Middle trapezius  3+  Lower trapezius  3-  Elbow flexion    Elbow extension    Wrist flexion    Wrist extension    Wrist ulnar deviation    Wrist radial deviation    Wrist pronation    Wrist supination    Grip strength (lbs)    (Blank  rows = not tested)  SHOULDER SPECIAL TESTS: Did not assess  JOINT MOBILITY TESTING:  Hypomobile ant and inferior glenohumeral joint  PALPATION:  No overt tenderness to palpation                                                                                                                             TREATMENT DATE:  09/01/24: HEP to be initiated; self care/education only today   PATIENT EDUCATION: Education details: Discussed PT for prevention and establishing maintenance HEP, exam findings, POC Person educated: Patient Education method: Explanation and Demonstration Education comprehension: verbalized understanding, returned demonstration, and needs further education  HOME EXERCISE PROGRAM: To be initiated  ASSESSMENT:  CLINICAL IMPRESSION: Patient is a 73 y.o. F who was seen today for physical therapy evaluation and treatment for chronic L shoulder pain. PMH significant for shoulder OA. Pt feels fully functional at baseline but does demo some posterior/scapular weakness. Range of motion is limited but pt does not feel that it is limiting her daily function. Discussed establishing HEP to improve her proximal shoulder stability for a few visits to reduce strain in her glenohumeral joint.   OBJECTIVE IMPAIRMENTS: decreased coordination, decreased endurance, decreased ROM, decreased strength, improper body mechanics, postural dysfunction, and pain.   ACTIVITY LIMITATIONS: carrying and reach over head  PARTICIPATION LIMITATIONS: community activity and yard work  PERSONAL FACTORS: Fitness are also affecting patient's functional outcome.   REHAB POTENTIAL: Good  CLINICAL DECISION MAKING: Stable/uncomplicated  EVALUATION COMPLEXITY: Low   GOALS: Goals reviewed with patient? Yes  SHORT/LONG TERM GOALS: Target date: 09/29/2024   Pt will be ind with HEP Baseline: Goal status: INITIAL  2.  Pt will demo improved scapular strength with at least 4/5 on MMT Baseline:  Goal  status: INITIAL  3.  Pt will have improved QuickDASH to </=10.5 to demo MCID Baseline:  Goal status: INITIAL  PLAN:  PT FREQUENCY: 4 visits  PT DURATION: 6 weeks  PLANNED INTERVENTIONS: 97164- PT Re-evaluation, 97750- Physical Performance Testing, 97110-Therapeutic exercises, 97530- Therapeutic activity, [id number]- Neuromuscular re-education, 97535- Self Care, 02859- Manual therapy, [id number]- Electrical stimulation (unattended), 97016- Vasopneumatic device, [id number]- Ultrasound, [id number]- Ionotophoresis 4mg /ml Dexamethasone , 79439 (1-2 muscles), 20561 (3+ muscles)- Dry Needling, Patient/Family education, Taping, Joint mobilization, Cryotherapy, and Moist heat  PLAN FOR NEXT SESSION: Initiate scapular strengthening/proximal shoulder stability. ROM as tolerated. Safe lifting mechanics   Sharene Krikorian April Ma L [place], PT, DPT 09/01/2024, 10:19 AM

## 2024-09-01 NOTE — Progress Notes (Signed)
 Prior authorization for US  Carotid Bilateral has been submitted on [employer]. No prior authorization required Decision ID #: [id number]

## 2024-09-03 ENCOUNTER — Ambulatory Visit (INDEPENDENT_AMBULATORY_CARE_PROVIDER_SITE_OTHER): Admitting: Family Medicine

## 2024-09-03 ENCOUNTER — Encounter: Payer: Self-pay | Admitting: Family Medicine

## 2024-09-03 VITALS — BP 93/62 | HR 90 | Temp 97.8°F | Ht 66.0 in | Wt 177.0 lb

## 2024-09-03 DIAGNOSIS — M25562 Pain in left knee: Secondary | ICD-10-CM

## 2024-09-03 MED ORDER — PREDNISONE 20 MG PO TABS: 40.0000 mg | ORAL_TABLET | Freq: Every day | ORAL | 0 refills | Status: AC

## 2024-09-03 NOTE — Patient Instructions (Signed)
 Ice Brace Pred x3 days

## 2024-09-03 NOTE — Progress Notes (Signed)
 Subjective: CC: Left knee pain PCP: Jolinda Norene HERO, DO YEP:Jenna Smith is a 73 y.o. female presenting to clinic today for:  Patient reports onset of left-sided knee pain on Tuesday.  Denies any preceding injury.  Points anterior medial aspect of the left knee as the area of discomfort.  She notes it almost feels bruised on the inside.  Pain is worse with walking and relieved by rest.  She had corticosteroid injection done in her left shoulder and was told that she might get an infection so she just wanted to make sure nothing was going on.  She was actually told by her orthopedist that her left knee looked fine.  She has history of right knee surgery.   ROS: Per HPI  Allergies  Allergen Reactions   Bactrim [Sulfamethoxazole-Trimethoprim] Shortness Of Breath   Bee Venom Anaphylaxis   Indomethacin Swelling    Face swells up   Avocado Hives   Bacitracin    Latex Hives   Neosporin [Neomycin-Bacitracin Zn-Polymyx] Hives   Other     Numerous food allergies: Broccoli, buckwheat, cabbage, cantaloupe, cinnamon, corn, polyps, lumen, molds, mushroom, black pepper, green pepper, pineapple, sweet potato, spinach, tomato, whole-wheat, whole egg, turkey meat, cow milk, crab, lobster, salmon, shellfish mix, coconut, black walnut.  *Patient states she continues to eat essentially all of these items.   Penicillins Hives    Has patient had a PCN reaction causing immediate rash, facial/tongue/throat swelling, SOB or lightheadedness with hypotension: no Has patient had a PCN reaction causing severe rash involving mucus membranes or skin necrosis: no Has patient had a PCN reaction that required hospitalization: no Has patient had a PCN reaction occurring within the last 10 years: no If all of the above answers are NO, then may proceed with Cephalosporin use.    Polymyxin B     Pomegranate (Punica Granatum) Hives   Past Medical History:  Diagnosis Date   Allergy     multiple food and  environmental allergies   Anemia 12/22/2017   Arthritis     Current Outpatient Medications:    acetaminophen  (TYLENOL ) 650 MG CR tablet, Take 650 mg by mouth every 8 (eight) hours as needed for pain., Disp: , Rfl:    Calcium  Carbonate-Vit D-Min (CALCIUM  1200) 1200-1000 MG-UNIT CHEW, Chew 2 tablets by mouth daily., Disp: , Rfl:    diclofenac  (VOLTAREN ) 75 MG EC tablet, Take 75 mg by mouth 2 (two) times daily., Disp: , Rfl:    Liver Extract (LIVER PO), Take 4,500 mg by mouth daily., Disp: , Rfl:    methocarbamol  (ROBAXIN ) 500 MG tablet, Take 1 tablet (500 mg total) by mouth every 8 (eight) hours as needed for muscle spasms., Disp: 60 tablet, Rfl: 3   Omega-3 Fatty Acids (FISH OIL) 1000 MG CAPS, Take 2,000 mg by mouth in the morning, at noon, and at bedtime. Relief Factor, Disp: , Rfl:    rosuvastatin  (CRESTOR ) 10 MG tablet, Take 1 tablet (10 mg total) by mouth daily., Disp: 90 tablet, Rfl: 3   Turmeric (QC TUMERIC COMPLEX PO), Take 1,000 mcg by mouth 2 (two) times daily., Disp: , Rfl:    meclizine  (ANTIVERT ) 12.5 MG tablet, Take 1 tablet (12.5 mg total) by mouth 3 (three) times daily as needed for dizziness. (Patient not taking: Reported on 08/30/2024), Disp: 30 tablet, Rfl: 0 Social History   Socioeconomic History   Marital status: Married    Spouse name: Marcey   Number of children: 1   Years of education: 52  Highest education level: Some college, no degree  Occupational History   Occupation: retired  Tobacco Use   Smoking status: Former    Current packs/day: 0.00    Average packs/day: 0.3 packs/day for 15.0 years (4.5 ttl pk-yrs)    Types: Cigarettes    Start date: 10/28/1969    Quit date: 10/28/1984    Years since quitting: 39.8   Smokeless tobacco: Never   Tobacco comments:    smoked off and on x 15 yrs  Vaping Use   Vaping status: Never Used  Substance and Sexual Activity   Alcohol use: Not Currently    Comment: Holidays - occasional wine   Drug use: No   Sexual activity:  Yes    Birth control/protection: Post-menopausal  Other Topics Concern   Not on file  Social History Narrative   Are you right handed or left handed? Right Handed   Are you currently employed ? No    What is your current occupation? Retired    Do you live at home alone? No    Who lives with you? Husband    What type of home do you live in: 1 story or 2 story? Lives in a one story home       Social Drivers of Health   Financial Resource Strain: Low Risk  (09/01/2024)   Overall Financial Resource Strain (CARDIA)    Difficulty of Paying Living Expenses: Not hard at all  Food Insecurity: No Food Insecurity (09/01/2024)   Hunger Vital Sign    Worried About Running Out of Food in the Last Year: Never true    Ran Out of Food in the Last Year: Never true  Transportation Needs: No Transportation Needs (09/01/2024)   PRAPARE - Administrator, Civil Service (Medical): No    Lack of Transportation (Non-Medical): No  Physical Activity: Sufficiently Active (09/01/2024)   Exercise Vital Sign    Days of Exercise per Week: 5 days    Minutes of Exercise per Session: 60 min  Stress: No Stress Concern Present (09/01/2024)   [employer] of Occupational Health - Occupational Stress Questionnaire    Feeling of Stress: Only a little  Social Connections: Moderately Isolated (09/01/2024)   Social Connection and Isolation Panel    Frequency of Communication with Friends and Family: More than three times a week    Frequency of Social Gatherings with Friends and Family: More than three times a week    Attends Religious Services: Never    Database Administrator or Organizations: No    Attends Engineer, Structural: Not on file    Marital Status: Married  Catering Manager Violence: Not At Risk (07/31/2021)   Humiliation, Afraid, Rape, and Kick questionnaire    Fear of Current or Ex-Partner: No    Emotionally Abused: No    Physically Abused: No    Sexually Abused: No   Family  History  Problem Relation Age of Onset   Aneurysm Mother        passed away at age 88   Cancer Father        passed away after Prostate cancer metastesize   Cancer Sister        she passed away 22 due to Breast Cancer   Stroke Sister    Aneurysm Sister    Colon cancer Neg Hx    Breast cancer Neg Hx     Objective: Office vital signs reviewed. BP 93/62   Pulse 90  Temp 97.8 F (36.6 C)   Ht 5' 6 (1.676 m)   Wt 177 lb (80.3 kg)   SpO2 92%   BMI 28.57 kg/m   Physical Examination:  General: Awake, alert, well nourished, No acute distress MSK: Ambulating independently with normal gait and station.  No tenderness to palpation to patella, patellar tendon, quads tendon or posterior popliteal fossa.  No tenderness to palpation along the joint line.  No gross joint effusion, erythema or warmth.  No ligamentous laxity.  Negative Thessaly.  Assessment/ Plan: 73 y.o. female   Acute pain of left knee - Plan: predniSONE  (DELTASONE ) 20 MG tablet   ?  Osteoarthritis versus bony contusion.  Her exam was unremarkable.  Doubt gout or meniscal injury.  Will treat with a short burst of prednisone .  Ice, compression.  Follow-up with orthopedics if symptoms are ongoing for reevaluation   Dafne Nield CHRISTELLA Fielding, DO [facility] [phone]

## 2024-09-08 ENCOUNTER — Ambulatory Visit: Admitting: Physical Therapy

## 2024-09-08 DIAGNOSIS — M25612 Stiffness of left shoulder, not elsewhere classified: Secondary | ICD-10-CM

## 2024-09-08 DIAGNOSIS — M25512 Pain in left shoulder: Secondary | ICD-10-CM | POA: Diagnosis not present

## 2024-09-08 DIAGNOSIS — G8929 Other chronic pain: Secondary | ICD-10-CM

## 2024-09-08 DIAGNOSIS — R42 Dizziness and giddiness: Secondary | ICD-10-CM

## 2024-09-08 DIAGNOSIS — M6281 Muscle weakness (generalized): Secondary | ICD-10-CM

## 2024-09-08 NOTE — Therapy (Signed)
 OUTPATIENT PHYSICAL THERAPY TREATMENT   Patient Name: Jenna Smith MRN: [id number] DOB:06-24-1951, 73 y.o., female Today's Date: 09/08/2024  END OF SESSION:  PT End of Session - 09/08/24 1013     Visit Number 2    Number of Visits 12    Date for Recertification  10/13/24    Authorization Type [employer] Medicare    PT Start Time 1015    PT Stop Time 1055    PT Time Calculation (min) 40 min    Activity Tolerance Patient tolerated treatment well           Past Medical History:  Diagnosis Date   Allergy     multiple food and environmental allergies   Anemia 12/22/2017   Arthritis    Past Surgical History:  Procedure Laterality Date   BREAST SURGERY Bilateral 02/2000   Breast reduction   CATARACT EXTRACTION W/PHACO Right 04/05/2024   Procedure: PHACOEMULSIFICATION, CATARACT, WITH IOL INSERTION;  Surgeon: Harrie Agent, MD;  Location: [facility];  Service: Ophthalmology;  Laterality: Right;  CDE: 14.01   CATARACT EXTRACTION W/PHACO Left 04/19/2024   Procedure: PHACOEMULSIFICATION, CATARACT, WITH IOL INSERTION;  Surgeon: Harrie Agent, MD;  Location: [facility];  Service: Ophthalmology;  Laterality: Left;  CDE:12.90   COLONOSCOPY WITH PROPOFOL  N/A 05/29/2021   Surgeon: Cindie Dunnings K, DO; Nonbleeding internal hemorrhoids, 10 mm polyp in the cecum.  Pathology revealed tubular adenoma.  Recommended repeat colonoscopy in 3 years.   COSMETIC SURGERY  06/2011   Face lift   ESOPHAGOGASTRODUODENOSCOPY (EGD) WITH PROPOFOL  N/A 05/29/2021   Surgeon: Cindie Dunnings POUR, DO; Gastritis biopsied, normal esophagus and duodenum. Pathology with reactive gastropathy, negative for H. pylori.   INSERTION, STENT, DRUG-ELUTING, LACRIMAL CANALICULUS Right 04/05/2024   Procedure: INSERTION, STENT, DRUG-ELUTING, LACRIMAL CANALICULUS;  Surgeon: Harrie Agent, MD;  Location: [facility];  Service: Ophthalmology;  Laterality: Right;   INSERTION, STENT, DRUG-ELUTING, LACRIMAL CANALICULUS Left 04/19/2024   Procedure:  INSERTION, STENT, DRUG-ELUTING, LACRIMAL CANALICULUS;  Surgeon: Harrie Agent, MD;  Location: [facility];  Service: Ophthalmology;  Laterality: Left;   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   Procedure: RIGHT TOTAL KNEE ARTHROPLASTY;  Surgeon: Heide Ingle, MD;  Location: [facility];  Service: Orthopedics;  Laterality: Right;   Patient Active Problem List   Diagnosis Date Noted   Osteopenia with high risk of fracture 06/14/2024   Symptomatic mammary hypertrophy 10/03/2021   Back pain 10/03/2021   Neck pain 10/03/2021   Diarrhea 05/07/2021   Abdominal pain, epigastric 05/07/2021   Colon cancer screening 05/07/2021   Dizziness 03/02/2020   TBI (traumatic brain injury) (HCC) 03/02/2020   Traumatic brain injury (HCC) 01/07/2018   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   Syncope 12/22/2017   Hypokalemia 12/22/2017   Anemia 12/22/2017   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 Shoulder remains non painful at rest. Will be seeing ortho for her L knee pain. Hand dominance: Left  PERTINENT HISTORY: History of a TBI, osteoarthritis, allergies, and vertigo  PAIN:  Are you having pain?  Yes: NPRS scale: 0 pain at rest, at most 4 or 5 at most but brief Pain location: side of L arm Pain description: doesn't linger Aggravating factors: Reaching behind back Relieving factors: tylenol   PRECAUTIONS: None  RED FLAGS: None   WEIGHT BEARING RESTRICTIONS: No  FALLS:  Has patient fallen in last 6 months? 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  DIAGNOSTIC FINDINGS:  Nothing new seen on Epic for her shoulder  PATIENT SURVEYS:  Quick Dash:  QUICK DASH  Please rate your ability do the following activities in the last week by selecting the number below the appropriate response.   Activities Rating  Open a tight or new jar.  1 = No difficulty   Do heavy household chores (e.g., wash walls, floors). 2 = Mild difficulty  Carry a shopping bag or briefcase 1 = No difficulty   Wash your back. 3 = Moderate difficulty  Use a knife to cut food. 1 = No difficulty   Recreational activities in which you take some force or impact through your arm, shoulder or hand (e.g., golf, hammering, tennis, etc.). 2 = Mild difficulty  During the past week, to what extent has your arm, shoulder or hand problem interfered with your normal social activities with family, friends, neighbors or groups?  2 = Slightly  During the past week, were you limited in your work or other regular daily activities as a result of your arm, shoulder or hand problem? 2 = Slightly limited  Rate the severity of the following symptoms in the last week: Arm, Shoulder, or hand pain. 3 = Moderate  Rate the severity of the following symptoms in the last week: Tingling  (pins and needles) in your arm, shoulder or hand. 1 = none  During the past week, how much difficulty have you had sleeping because of the pain in your arm, shoulder or hand?  2 = Mild difficulty  Score 20.5/100   (A QuickDASH score may not be calculated if there is greater than 1 missing item.)   Minimally Clinically Important Difference (MCID): 15-20 points  (Franchignoni, F. et al. (2013). Minimally clinically important difference of the disabilities of the arm, shoulder, and hand outcome measures (DASH) and its shortened version (Quick DASH). Journal of Orthopaedic & Sports Physical Therapy, 44(1), 30-39)   COGNITION: Overall cognitive status: Within functional limits for tasks assessed     SENSATION: WFL  POSTURE: Rounded shoulders  UPPER EXTREMITY ROM:   Active ROM Right eval Left eval  Shoulder flexion 140 130  Shoulder extension 65 60  Shoulder abduction 150 150  Shoulder adduction    Shoulder internal rotation  30  Shoulder external rotation  25  Elbow flexion    Elbow extension    Wrist flexion    Wrist extension    Wrist ulnar deviation    Wrist radial deviation    Wrist pronation    Wrist supination    (  Blank rows = not tested)  UPPER EXTREMITY MMT:  MMT Right eval Left eval  Shoulder flexion 5 5  Shoulder extension  3+  Shoulder abduction 5 5  Shoulder adduction    Shoulder internal rotation 5 5  Shoulder external rotation 5  5 within available range  Middle trapezius  3+  Lower trapezius  3-  Elbow flexion    Elbow extension    Wrist flexion    Wrist extension    Wrist ulnar deviation    Wrist radial deviation    Wrist pronation    Wrist supination    Grip strength (lbs)    (Blank rows = not tested)  SHOULDER SPECIAL TESTS: Did not assess  JOINT MOBILITY TESTING:  Hypomobile ant and inferior glenohumeral joint  PALPATION:  No overt tenderness to palpation                                                                                                                              TREATMENT DATE:  09/08/24: UBE 90 RPM; 4 min fwd, 4 min bwd Row green TB 2x10 Shoulder ext green TB 2x10 Bow and arrow green TB 2x10 R&L Attempted horizontal shoulder abd but too painful for pt Wall clock x10   09/01/24: HEP to be initiated; self care/education only today   PATIENT EDUCATION: Education details: Discussed PT for prevention and establishing maintenance HEP, exam findings, POC Person educated: Patient Education method: Explanation and Demonstration Education comprehension: verbalized understanding, returned demonstration, and needs further education  HOME EXERCISE PROGRAM: Access Code: [email or web address] URL: [email or web address] Date: 09/08/2024 Prepared by: Johnika Escareno April Earnie Starring  Exercises - Standing Bilateral Low Shoulder Row with Anchored Resistance  - 1 x daily - 7 x weekly - 2 sets - 10 reps - Shoulder extension with resistance - Neutral  - 1 x daily - 7 x weekly - 2 sets - 10 reps - Drawing Bow  - 1 x daily - 7 x weekly - 2 sets - 10 reps - Wall Clock  - 1 x daily - 7 x weekly - 2 sets - 10 reps  ASSESSMENT:  CLINICAL IMPRESSION: Treatment focused on initiating scapular and midback strengthening. Too painful when attempting horizontal shoulder abduction. Otherwise able to tolerate exercises well.   OBJECTIVE IMPAIRMENTS: decreased coordination, decreased endurance, decreased ROM, decreased strength, improper body mechanics, postural dysfunction, and pain.   ACTIVITY LIMITATIONS: carrying and reach over head  PARTICIPATION LIMITATIONS: community activity and yard work  PERSONAL FACTORS: Fitness are also affecting patient's functional outcome.   REHAB POTENTIAL: Good  CLINICAL DECISION MAKING: Stable/uncomplicated  EVALUATION COMPLEXITY: Low   GOALS: Goals reviewed with patient? Yes  SHORT/LONG TERM GOALS: Target date: 09/29/2024   Pt will be ind with HEP Baseline: Goal status:  INITIAL  2.  Pt will demo improved scapular strength with at least 4/5 on MMT Baseline:  Goal status: INITIAL  3.  Pt will have improved QuickDASH to </=10.5 to demo  MCID Baseline:  Goal status: INITIAL      PLAN:  PT FREQUENCY: 4 visits  PT DURATION: 6 weeks  PLANNED INTERVENTIONS: 97164- PT Re-evaluation, 97750- Physical Performance Testing, 97110-Therapeutic exercises, 97530- Therapeutic activity, [id number]- Neuromuscular re-education, 97535- Self Care, 02859- Manual therapy, [id number]- Electrical stimulation (unattended), 97016- Vasopneumatic device, [id number]- Ultrasound, [id number]- Ionotophoresis 4mg /ml Dexamethasone , 79439 (1-2 muscles), 20561 (3+ muscles)- Dry Needling, Patient/Family education, Taping, Joint mobilization, Cryotherapy, and Moist heat  PLAN FOR NEXT SESSION: Initiate scapular strengthening/proximal shoulder stability. ROM as tolerated. Safe lifting mechanics   Aeron Donaghey April Ma L Timarion Agcaoili, PT, DPT 09/08/2024, 11:13 AM

## 2024-09-09 ENCOUNTER — Ambulatory Visit (HOSPITAL_COMMUNITY)
Admission: RE | Admit: 2024-09-09 | Discharge: 2024-09-09 | Disposition: A | Discharge status: Home / Self Care | Source: Ambulatory Visit | Attending: Neurology | Admitting: Neurology

## 2024-09-09 DIAGNOSIS — R42 Dizziness and giddiness: Secondary | ICD-10-CM | POA: Insufficient documentation

## 2024-09-10 ENCOUNTER — Ambulatory Visit: Payer: Self-pay | Admitting: Neurology

## 2024-09-15 ENCOUNTER — Ambulatory Visit: Admitting: Physical Therapy

## 2024-09-15 ENCOUNTER — Encounter: Payer: Self-pay | Admitting: Physical Therapy

## 2024-09-15 DIAGNOSIS — G8929 Other chronic pain: Secondary | ICD-10-CM

## 2024-09-15 DIAGNOSIS — M25512 Pain in left shoulder: Secondary | ICD-10-CM | POA: Diagnosis not present

## 2024-09-15 DIAGNOSIS — M25612 Stiffness of left shoulder, not elsewhere classified: Secondary | ICD-10-CM

## 2024-09-15 DIAGNOSIS — R42 Dizziness and giddiness: Secondary | ICD-10-CM

## 2024-09-15 DIAGNOSIS — M6281 Muscle weakness (generalized): Secondary | ICD-10-CM

## 2024-09-15 NOTE — Therapy (Signed)
 OUTPATIENT PHYSICAL THERAPY TREATMENT   Patient Name: Jenna Smith MRN: [id number] DOB:[birth year], 73 y.o., female Today's Date: 09/15/2024  END OF SESSION:  PT End of Session - 09/15/24 0759     Visit Number 3    Number of Visits 12    Date for Recertification  10/13/24    Authorization Type [employer]    PT Start Time 0800    PT Stop Time 0840    PT Time Calculation (min) 40 min    Activity Tolerance Patient tolerated treatment well            Past Medical History:  Diagnosis Date   Allergy     multiple food and environmental allergies   Anemia 12/22/2017   Arthritis    Past Surgical History:  Procedure Laterality Date   BREAST SURGERY Bilateral 02/2000   Breast reduction   CATARACT EXTRACTION W/PHACO Right 04/05/2024   Procedure: PHACOEMULSIFICATION, CATARACT, WITH IOL INSERTION;  Surgeon: Harrie Agent, MD;  Location: [facility];  Service: Ophthalmology;  Laterality: Right;  CDE: 14.01   CATARACT EXTRACTION W/PHACO Left 04/19/2024   Procedure: PHACOEMULSIFICATION, CATARACT, WITH IOL INSERTION;  Surgeon: Harrie Agent, MD;  Location: [facility];  Service: Ophthalmology;  Laterality: Left;  CDE:12.90   COLONOSCOPY WITH PROPOFOL  N/A 05/29/2021   Surgeon: Cindie Dunnings K, DO; Nonbleeding internal hemorrhoids, 10 mm polyp in the cecum.  Pathology revealed tubular adenoma.  Recommended repeat colonoscopy in 3 years.   COSMETIC SURGERY  06/2011   Face lift   ESOPHAGOGASTRODUODENOSCOPY (EGD) WITH PROPOFOL  N/A 05/29/2021   Surgeon: Cindie Dunnings POUR, DO; Gastritis biopsied, normal esophagus and duodenum. Pathology with reactive gastropathy, negative for H. pylori.   INSERTION, STENT, DRUG-ELUTING, LACRIMAL CANALICULUS Right 04/05/2024   Procedure: INSERTION, STENT, DRUG-ELUTING, LACRIMAL CANALICULUS;  Surgeon: Harrie Agent, MD;  Location: [facility];  Service: Ophthalmology;  Laterality: Right;   INSERTION, STENT, DRUG-ELUTING, LACRIMAL CANALICULUS Left 04/19/2024    Procedure: INSERTION, STENT, DRUG-ELUTING, LACRIMAL CANALICULUS;  Surgeon: Harrie Agent, MD;  Location: [facility];  Service: Ophthalmology;  Laterality: Left;   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   Procedure: RIGHT TOTAL KNEE ARTHROPLASTY;  Surgeon: Heide Ingle, MD;  Location: [facility];  Service: Orthopedics;  Laterality: Right;   Patient Active Problem List   Diagnosis Date Noted   Osteopenia with high risk of fracture 06/14/2024   Symptomatic mammary hypertrophy 10/03/2021   Back pain 10/03/2021   Neck pain 10/03/2021   Diarrhea 05/07/2021   Abdominal pain, epigastric 05/07/2021   Colon cancer screening 05/07/2021   Dizziness 03/02/2020   TBI (traumatic brain injury) (HCC) 03/02/2020   Traumatic brain injury (HCC) 01/07/2018   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   Syncope 12/22/2017   Hypokalemia 12/22/2017   Anemia 12/22/2017   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   Tendonitis of knee, right 06/11/2017   Obesity (BMI 30-39.9) 06/21/2016   Allergic rhinitis 02/26/2016   Right knee pain 02/26/2016    PCP: Jolinda Norene HERO, DO  REFERRING PROVIDER: Melita Drivers, MD  REFERRING DIAG: [phone] (ICD-10-CM) - Pain in left shoulder  THERAPY DIAG:  Chronic left shoulder pain  Stiffness of left shoulder, not elsewhere classified  Muscle weakness (generalized)  Dizziness and giddiness  Rationale for Evaluation and Treatment: Rehabilitation  ONSET DATE: A few years  SUBJECTIVE:  SUBJECTIVE STATEMENT: Pt states her L knee is feeling better -- got an injection yesterday.  Hand dominance: Left  PERTINENT HISTORY: History of a TBI, osteoarthritis, allergies, and vertigo  PAIN:  Are you having pain? Yes: NPRS scale: 0 pain at  rest, at most 4 or 5 at most but brief Pain location: side of L arm Pain description: doesn't linger Aggravating factors: Reaching behind back Relieving factors: tylenol   PRECAUTIONS: None  RED FLAGS: None   WEIGHT BEARING RESTRICTIONS: No  FALLS:  Has patient fallen in last 6 months? 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  DIAGNOSTIC FINDINGS:  Nothing new seen on Epic for her shoulder  PATIENT SURVEYS:  Quick Dash:  QUICK DASH  Please rate your ability do the following activities in the last week by selecting the number below the appropriate response.   Activities Rating  Open a tight or new jar.  1 = No difficulty   Do heavy household chores (e.g., wash walls, floors). 2 = Mild difficulty  Carry a shopping bag or briefcase 1 = No difficulty   Wash your back. 3 = Moderate difficulty  Use a knife to cut food. 1 = No difficulty   Recreational activities in which you take some force or impact through your arm, shoulder or hand (e.g., golf, hammering, tennis, etc.). 2 = Mild difficulty  During the past week, to what extent has your arm, shoulder or hand problem interfered with your normal social activities with family, friends, neighbors or groups?  2 = Slightly  During the past week, were you limited in your work or other regular daily activities as a result of your arm, shoulder or hand problem? 2 = Slightly limited  Rate the severity of the following symptoms in the last week: Arm, Shoulder, or hand pain. 3 = Moderate  Rate the severity of the following symptoms in the last week: Tingling (pins and needles) in your  arm, shoulder or hand. 1 = none  During the past week, how much difficulty have you had sleeping because of the pain in your arm, shoulder or hand?  2 = Mild difficulty  Score 20.5/100   (A QuickDASH score may not be calculated if there is greater than 1 missing item.)   Minimally Clinically Important Difference (MCID): 15-20 points  (Franchignoni, F. et al. (2013). Minimally clinically important difference of the disabilities of the arm, shoulder, and hand outcome measures (DASH) and its shortened version (Quick DASH). Journal of Orthopaedic & Sports Physical Therapy, 44(1), 30-39)   COGNITION: Overall cognitive status: Within functional limits for tasks assessed     SENSATION: WFL  POSTURE: Rounded shoulders  UPPER EXTREMITY ROM:   Active ROM Right eval Left eval  Shoulder flexion 140 130  Shoulder extension 65 60  Shoulder abduction 150 150  Shoulder adduction    Shoulder internal rotation  30  Shoulder external rotation  25  Elbow flexion    Elbow extension    Wrist flexion    Wrist extension    Wrist ulnar deviation    Wrist radial deviation    Wrist pronation    Wrist supination    (Blank rows =  not tested)  UPPER EXTREMITY MMT:  MMT Right eval Left eval  Shoulder flexion 5 5  Shoulder extension  3+  Shoulder abduction 5 5  Shoulder adduction    Shoulder internal rotation 5 5  Shoulder external rotation 5  5 within available range  Middle trapezius  3+  Lower trapezius  3-  Elbow flexion    Elbow extension    Wrist flexion    Wrist extension    Wrist ulnar deviation    Wrist radial deviation    Wrist pronation    Wrist supination    Grip strength (lbs)    (Blank rows = not tested)  SHOULDER SPECIAL TESTS: Did not assess  JOINT MOBILITY TESTING:  Hypomobile ant and inferior glenohumeral joint  PALPATION:  No overt tenderness to palpation                                                                                                                              TREATMENT DATE:  09/08/24: UBE 90 RPM; 4 min fwd, 4 min bwd Row green TB x2 min Shoulder ext green TB x2 min Bow and arrow green TB x2 min R&L Prone horizontal abd with PT assist 2x10 Prone W 2x10 Prone low trap setting 2x10    PATIENT EDUCATION: Education details: Discussed PT for prevention and establishing maintenance HEP, exam findings, POC Person educated: Patient Education method: Medical Illustrator Education comprehension: verbalized understanding, returned demonstration, and needs further education  HOME EXERCISE PROGRAM: Access Code: [email or web address] URL: [email or web address] Date: 09/08/2024 Prepared by: Leinani Lisbon April Earnie Starring  Exercises - Standing Bilateral Low Shoulder Row with Anchored Resistance  - 1 x daily - 7 x weekly - 2 sets - 10 reps - Shoulder extension with resistance - Neutral  - 1 x daily - 7 x weekly - 2 sets - 10 reps - Drawing Bow  - 1 x daily - 7 x weekly - 2 sets - 10 reps - Wall Clock  - 1 x daily - 7 x weekly - 2 sets - 10 reps  ASSESSMENT:  CLINICAL IMPRESSION: Pt notes she has not been compliant with HEP. Has been working on posterior shoulder girdle strengthening. Shoulder ER is weak at >60 deg of abd. Weak mid and low traps -- initiated strengthening these this session.  OBJECTIVE IMPAIRMENTS: decreased coordination, decreased endurance, decreased ROM, decreased strength, improper body mechanics, postural dysfunction, and pain.   ACTIVITY LIMITATIONS: carrying and reach over head  PARTICIPATION LIMITATIONS: community activity and yard work  PERSONAL FACTORS: Fitness are also affecting patient's functional outcome.   REHAB POTENTIAL: Good  CLINICAL DECISION MAKING: Stable/uncomplicated  EVALUATION COMPLEXITY: Low   GOALS: Goals reviewed with patient? Yes  SHORT/LONG TERM GOALS: Target date: 09/29/2024   Pt will be ind with HEP Baseline: Goal status: INITIAL  2.  Pt will demo  improved scapular strength with at least 4/5 on MMT Baseline:  Goal status: INITIAL  3.  Pt  will have improved QuickDASH to </=10.5 to demo MCID Baseline:  Goal status: INITIAL      PLAN:  PT FREQUENCY: 4 visits  PT DURATION: 6 weeks  PLANNED INTERVENTIONS: 97164- PT Re-evaluation, 97750- Physical Performance Testing, 97110-Therapeutic exercises, 97530- Therapeutic activity, [id number]- Neuromuscular re-education, 97535- Self Care, 02859- Manual therapy, G0283- Electrical stimulation (unattended), 97016- Vasopneumatic device, [id number]- Ultrasound, [id number]- Ionotophoresis 4mg /ml Dexamethasone , 79439 (1-2 muscles), 20561 (3+ muscles)- Dry Needling, Patient/Family education, Taping, Joint mobilization, Cryotherapy, and Moist heat  PLAN FOR NEXT SESSION: Initiate scapular strengthening/proximal shoulder stability. ROM as tolerated. Safe lifting mechanics   Juda Toepfer April Ma L Sherrie Marsan, PT, DPT 09/15/2024, 8:41 AM

## 2024-09-22 ENCOUNTER — Ambulatory Visit: Admitting: Physical Therapy

## 2024-09-28 ENCOUNTER — Ambulatory Visit: Admitting: Neurology

## 2024-10-15 ENCOUNTER — Other Ambulatory Visit: Payer: Self-pay | Admitting: Family Medicine

## 2024-10-15 DIAGNOSIS — Z1231 Encounter for screening mammogram for malignant neoplasm of breast: Secondary | ICD-10-CM

## 2024-10-18 ENCOUNTER — Encounter

## 2024-10-18 DIAGNOSIS — Z1231 Encounter for screening mammogram for malignant neoplasm of breast: Secondary | ICD-10-CM

## 2024-11-08 ENCOUNTER — Ambulatory Visit: Payer: Self-pay

## 2024-11-08 NOTE — Telephone Encounter (Signed)
 FYI Only or Action Required?: FYI only for provider: appointment scheduled on 11/10/24.  Patient was last seen in primary care on 09/03/2024 by Jolinda Norene HERO, DO.  Called Nurse Triage reporting Tailbone Pain.  Symptoms began today.  Interventions attempted: Nothing.  Symptoms are: unchanged.  Triage Disposition: See PCP When Office is Open (Within 3 Days)  Patient/caregiver understands and will follow disposition?: Yes   Copied from CRM #[id number]. Topic: Clinical - Red Word Triage >> Nov 08, 2024 12:47 PM Victoria B wrote: [facility] that prompted transfer to Nurse Triage: Patient has severe burning and ain in back Reason for Disposition  [1] MODERATE back pain (e.g., interferes with normal activities) AND [2] present > 3 days  Answer Assessment - Initial Assessment Questions Patient declines appts with alt prov, scheduled 11/12/24 with pcp.  Advised call back or ED/911 if symptoms occur/worsen: severe diff breathing, chest pain > 5 min, faint, loss control b/b, weakness, numbness, constant pain not improved with pain med. Patient verbalized understanding.   1. ONSET: When did the pain begin? (e.g., minutes, hours, days)     today 2. LOCATION: Where does it hurt? (upper, mid or lower back)     Tail bone pain 3. SEVERITY: How bad is the pain?  (e.g., Scale 1-10; mild, moderate, or severe)     severe 4. PATTERN: Is the pain constant? (e.g., yes, no; constant, intermittent)      Comes and goes; have not taken any pain medications yet 5. RADIATION: Does the pain shoot into your legs or somewhere else?     Lower back 6. CAUSE:  What do you think is causing the back pain?      unsure 7. BACK OVERUSE:  Any recent lifting of heavy objects, strenuous work or exercise?     no 8. MEDICINES: What have you taken so far for the pain? (e.g., nothing, acetaminophen , NSAIDS)     no 9. NEUROLOGIC SYMPTOMS: Do you have any weakness, numbness, or problems with bowel/bladder  control?     Denies weakness, numbness, problems with b/b control, abd pain 10. OTHER SYMPTOMS: Do you have any other symptoms? (e.g., fever, abdomen pain, burning with urination, blood in urine)    Denies fever chills n/v, diff breathing chest pain, faint  Protocols used: Back Pain-A-AH

## 2024-11-08 NOTE — Telephone Encounter (Signed)
 Appt made

## 2024-11-10 ENCOUNTER — Ambulatory Visit: Admitting: Family Medicine
